# Patient Record
Sex: Female | Born: 1975
Health system: Southern US, Community
[De-identification: ages and names within clinical notes are randomized; demographics above are authoritative.]

## PROBLEM LIST (undated history)

## (undated) DIAGNOSIS — D649 Anemia, unspecified: Secondary | ICD-10-CM

## (undated) DIAGNOSIS — G500 Trigeminal neuralgia: Secondary | ICD-10-CM

## (undated) DIAGNOSIS — C9000 Multiple myeloma not having achieved remission: Secondary | ICD-10-CM

## (undated) DIAGNOSIS — O139 Gestational [pregnancy-induced] hypertension without significant proteinuria, unspecified trimester: Secondary | ICD-10-CM

## (undated) DIAGNOSIS — O24419 Gestational diabetes mellitus in pregnancy, unspecified control: Secondary | ICD-10-CM

## (undated) HISTORY — DX: Gestational (pregnancy-induced) hypertension without significant proteinuria, unspecified trimester: O13.9

## (undated) HISTORY — DX: Trigeminal neuralgia: G50.0

## (undated) HISTORY — DX: Gestational diabetes mellitus in pregnancy, unspecified control: O24.419

## (undated) HISTORY — DX: Anemia, unspecified: D64.9

## (undated) HISTORY — PX: TRIGEMINAL NERVE DECOMPRESSION: SHX2579

## (undated) HISTORY — PX: APPENDECTOMY: SHX54

---

## 2004-04-17 ENCOUNTER — Inpatient Hospital Stay (HOSPITAL_COMMUNITY): Admission: AD | Admit: 2004-04-17 | Discharge: 2004-04-19 | Payer: Self-pay | Admitting: Obstetrics & Gynecology

## 2004-04-18 ENCOUNTER — Encounter (INDEPENDENT_AMBULATORY_CARE_PROVIDER_SITE_OTHER): Payer: Self-pay | Admitting: Specialist

## 2006-05-05 ENCOUNTER — Encounter (INDEPENDENT_AMBULATORY_CARE_PROVIDER_SITE_OTHER): Payer: Self-pay | Admitting: Specialist

## 2006-05-05 ENCOUNTER — Observation Stay (HOSPITAL_COMMUNITY): Admission: EM | Admit: 2006-05-05 | Discharge: 2006-05-06 | Payer: Self-pay | Admitting: Emergency Medicine

## 2006-07-15 HISTORY — PX: APPENDECTOMY: SHX54

## 2009-12-06 ENCOUNTER — Encounter: Admission: RE | Admit: 2009-12-06 | Discharge: 2009-12-06 | Payer: Self-pay | Admitting: Neurology

## 2010-07-15 HISTORY — PX: TRIGEMINAL NERVE DECOMPRESSION: SHX2579

## 2010-08-05 ENCOUNTER — Encounter: Payer: Self-pay | Admitting: Neurology

## 2010-11-30 NOTE — H&P (Signed)
NAMEHIBBA, SCHRAM                ACCOUNT NO.:  [id number]   MEDICAL RECORD NO.:  [id number]          PATIENT TYPE:  INP   LOCATION:  9172                          FACILITY:  [facility]   PHYSICIAN:  Gerri Spore B. Earlene Plater, M.D.  DATE OF BIRTH:  25-Jan-1976   DATE OF ADMISSION:  04/17/2004  DATE OF DISCHARGE:                                HISTORY & PHYSICAL   ADMISSION DIAGNOSES:  1.  A 35 week intrauterine pregnancy with twins.  2.  Spontaneous rupture of membranes.   HISTORY OF PRESENT ILLNESS:  A 35 year old African-American female, gravida  3, para 1, at 35+ weeks, [facility] May 19, 2004, with twin gestation,  presented today with spontaneous leakage of fluids since 0830.  Has had no  contractions.  Fluid has been clear.  Reports good fetal activity.   Prenatal care at [facility] has been uncomplicated except for late  entry to prenatal care at 24 weeks.   PAST MEDICAL HISTORY:  None.   FAMILY HISTORY:  Diabetes.   PAST SURGICAL HISTORY:  None.   MEDICATIONS:  Prenatal vitamins.   ALLERGIES:  None.   PRENATAL LABORATORY DATA:  O positive, rubella immune, hepatitis B, RPR, HIV  all negative.  Glucola normal.  Group B strep negative.   PHYSICAL EXAMINATION:  VITAL SIGNS:  Blood pressure 140/88, weight 216,  fetal heart tones 147 and 153.  GENERAL:  Alert and oriented in no acute distress.  SKIN:  Warm and dry, no lesions.  HEART:  Regular rate and rhythm.  LUNGS:  Clear to auscultation.  ABDOMEN:  Gravid.  Fundus nontender.  STERILE SPECULUM EXAM:  Large pool of clear fluid with vernix.  No meconium.  Cervix is 4 cm dilated, 80% effaced, -1 station.  Twin A is presenting  vertex.   ASSESSMENT:  1.  Twin intrauterine pregnancy at 35 weeks.  2.  Spontaneous rupture of membranes.  3.  Desires a trial of vaginal delivery.   PLAN:  1.  Admission to labor and delivery.  2.  Ultrasound to confirm presentation of B.  3.  Plan for attempts at vaginal delivery.     Wesl   WBD/MEDQ  D:  04/17/2004  T:  04/17/2004  Job:  [id number]

## 2010-11-30 NOTE — Op Note (Signed)
NAME:  Kara Medina, Kara Medina NO.:  [id number]   MEDICAL RECORD NO.:  [id number]          PATIENT TYPE:  INP   LOCATION:  9172                          FACILITY:  [facility]   PHYSICIAN:  Richardean Sale, M.D.   DATE OF BIRTH:  1975/10/24   DATE OF PROCEDURE:  04/17/2004  DATE OF DISCHARGE:                                 OPERATIVE REPORT   INDICATIONS FOR PROCEDURE:  The patient is a 35 year old African-American  female, gravida 3, para 1-0-1-1, with a known intrauterine gestation at 35+  weeks gestation, who presented with spontaneous rupture of membranes on the  morning of April 17, 2004.  The patient underwent ultrasound evaluation.  The twins were found to be vertex/vertex presentation and the patient  desired attempt at vaginal delivery.  She had a prior vaginal delivery of an  8 pound baby without complications with her first pregnancy.   DELIVERY FINDINGS:  Baby A occipitoanterior, Apgars 8 and 9, cord around the  shoulder, arterial pH 7.23.  Baby B left occipitoposterior, Apgars 9 and 9,  cord wrapped around the shoulder, arterial cord pH 7.26.   DESCRIPTION OF PROCEDURE:  The patient was augmented with Pitocin.  Made  adequate progress in labor and was monitored continuously with a fetal scalp  electrode for baby A and external morning for baby B.  Both heart rate  tracings had been reassuring with baselines in the 150's for baby B and  160's for baby A. Spontaneous accelerations were noted.  The patient began  to complain of pressure.  Baby A's heart rate was noted to have a baseline  of 160 to 170 with variable decelerations down to the 80's with  contractions.  Vaginal examination confirmed the patient was 9 cm,  completely dilated, and at 0 station.  Given the variable decelerations and  the twin intrauterine gestation, she was transferred to the operating room  for a double setup.  Baby B's heart rate tracing remained in the 150's with  an occasional mild  variable deceleration.  When the patient entered the  operating room, she was transferred to the delivery table.  Epidural was  already in place.  She was placed in the dorsal lithotomy position.  Foley  catheter was removed.  The heart rate monitor was reconnected, but failed to  record the tracing throughout the delivery process.  Baby A's heart rate  remained in the 160's to 170's with variable decelerations to the 100's.  The patient upon entering into the delivery room after she was prepped and  draped, was found to be complete, complete, and +1 station.  She delivered  the baby with the next contraction.  Her second stage was less than five  minutes.  The infant was occipitoanterior with a cord wrapped around the  shoulder.  The infant was bulb suctioned and the cord was clamped and cut  and the infant was transferred to the warmer with NICU in attendance.  Apgars for baby A were 8 and 9.  The cord for baby A was clamped with a  single hemostat.  Vaginal examination  was performed and baby B was confirmed  to be vertex and engaged in the pelvis.  Ultrasound confirmed the vertex  presentation.  Heart rate was in the 140's.  Amniotomy was performed, the  fluid was clear, and a scalp electrode was placed.  With the first maternal  push, the heart rate went down to the 60's for approximately 30 seconds and  then returned to baseline in the 140's.  Heart rate remained in the 140's  with mild variables to the 120's throughout the second stage which was  approximately five minutes.  The infant was delivered from the left  occipitoposterior.  There was  a cord around the shoulder.  The infant was  delivered with a vigorous cry and bulb suctioned.  The cord was clamped and  cut and handed off to the awaiting NICU attendants.  Apgars were 9 and 9.  The cord for baby B was then doubly clamped with two umbilical cord clamps.  Arterial cord gases were obtained for both.  Baby A's cord gas was  7.23,  baby B 7.26.  The placenta was then delivered spontaneously.  It appeared to  be fused.  Both cords had three vessel cord noted.  The placenta was sent to  pathology for evaluation.  The uterus was then explored and was free of any  placental tissue.  The cervix was visualized and was normal.  The vault was  visualized and was normal without lacerations or lesions.  The perineum was  intact.  There was a right periurethral abrasion which was hemostatic and  not repaired.  Bimanual examination was performed.  The fundus was firm.  Pitocin was  administered immediately following delivery of the placenta.  The infant's  were transferred to the well baby nursery.  Mother was taken out of the  dorsal lithotomy position and was transferred back to the labor room in  excellent condition.  Estimated blood loss was 300 mL.  There were no  complications.      JW/MEDQ  D:  04/17/2004  T:  04/17/2004  Job:  [id number]

## 2010-11-30 NOTE — H&P (Signed)
NAMEGROVER, WOODFIELD NO.:  [id number]   MEDICAL RECORD NO.:  [id number]          PATIENT TYPE:  EMS   LOCATION:  ED                           FACILITY:  [facility]   PHYSICIAN:  Lebron Conners, M.D.   DATE OF BIRTH:  Nov 24, 1975   DATE OF ADMISSION:  05/05/2006  DATE OF DISCHARGE:                                HISTORY & PHYSICAL   CHIEF COMPLAINT:  Abdominal pain.   PRESENT ILLNESS:  The patient is a 35 year old black female with about a 12-  hour history of central and then subsequent right lower quadrant abdominal  pain.  It hurt so bad, she came to the emergency room.  She vomited once.  There was no fever.  The pain persists.  A white count was about 16,000.  Urinalysis unremarkable.  CT scan of the abdomen demonstrated acute  appendicitis without evidence of rupture.   PAST MEDICAL HISTORY:  No serious illnesses.  No operations.  No familial  illnesses.  She is a nonsmoker, nondrinker, and uses no street drugs.   ALLERGIES:  No known allergies.   MEDICATIONS:  None except for pain medicine today.   REVIEW OF SYSTEMS:  Denies heart and lung problems.  Denies any hepatitis or  liver trouble.  No renal problems.  She denied all other symptoms from the  15 system reviews.   PHYSICAL EXAMINATION:  VITAL SIGNS:  Temperature and vital signs were unremarkable as recorded by  nurse.  GENERAL:  The patient was in pain but mental status was normal.  HEAD AND NECK:  Unremarkable with no enlargement of the thyroid.  No neck  mass.  No supraclavicular lymphadenopathy.  No scleral icterus.  Mucous  membranes moist.  CHEST:  Clear to auscultation.  No chest wall tenderness.  BREASTS:  Normal.  HEART: Rate and rhythm normal.  No murmur or gallop.  ABDOMEN: Very tender in the right lower quadrant with some rebound  tenderness.  No mass present.  Bowel sounds decreased.  No organomegaly.  EXTREMITIES: Normal.  No edema.  No skin lesions.  Good pulses.  SKIN: No lesions  are noted.  NEUROLOGIC: Grossly normal.   IMPRESSION:  Acute appendicitis.   PLAN:  Immediate laparoscopic appendectomy.  The patient agrees to have that  done and accepts the slight risks of the injuries to the viscera and the  need for open procedure.  I will proceed tonight.      Lebron Conners, M.D.  Electronically Signed     WB/MEDQ  D:  05/05/2006  T:  05/06/2006  Job:  [id number]

## 2010-11-30 NOTE — Op Note (Signed)
Kara Medina, LAMARCHE NO.:  [id number]   MEDICAL RECORD NO.:  [id number]          PATIENT TYPE:  EMS   LOCATION:  ED                           FACILITY:  [facility]   PHYSICIAN:  Lebron Conners, M.D.   DATE OF BIRTH:  1975-11-22   DATE OF PROCEDURE:  05/05/2006  DATE OF DISCHARGE:                                 OPERATIVE REPORT   PRE AND POSTOPERATIVE DIAGNOSIS:  Acute appendicitis.   OPERATION:  Laparoscopic appendectomy.   SURGEON:  Lebron Conners, M.D.   ANESTHESIA:  General and local.   SPECIMEN:  Appendix.   BLOOD LOSS:  Minimal.   COMPLICATIONS:  None.   PROCEDURE:  After the patient was monitored and asleep and had routine  preparation and draping of the abdomen and had a Foley catheter in place, I  infiltrated local anesthetic in the left lower quadrant and just below the  umbilicus and in the right upper quadrant.  I made a 2 cm vertical incision  just below the umbilicus and a similar incision in the left lower quadrant  and a very small incision and right upper quadrant.  I dissected down  through the fat just below the umbilicus and made a 2 cm incision in the  midline fascia and bluntly entered the peritoneum.  There were no adhesions.  I placed a 0 Vicryl pursestring suture in the fascia and secured a Buice  cannula and inflated the abdomen with carbon dioxide.  I then put in a 5 mm  right upper quadrant port and 11-mm left lower quadrant port under direct  view noting that I did not injure any viscera with insertion of the ports.  I then moved the small intestine away from the cecum and just inferior to  and behind cecum I found the appendix which was enlarged and acutely  inflamed.  I elevated it slightly and took down some adhesions laterally and  had a nice view of the proximal part of the appendix and mesentery.  The  appendix was healthy at its juncture with the cecum.  I stapled across the  appendix and the mesentery with endovascular  stapler and that made a nice  division.  I placed the appendix in a plastic pouch.  There were too small  bleeders on the mesentery which I cauterized and then hemostasis was good.  After assuring good hemostasis and no visceral injuries.  I removed the  appendix through the umbilical incision and tied the pursestring suture.  I  then removed the right upper quadrant port under direct view and saw no  bleeding from the abdominal wall.  After allowing the carbon dioxide to  escape and removed the left lower quadrant port.  I closed all skin  incisions with intracuticular 4-0 Vicryl and Steri-Strips.  The patient  tolerated the operation well.      Lebron Conners, M.D.  Electronically Signed    WB/MEDQ  D:  05/05/2006  T:  05/06/2006  Job:  [id number]

## 2011-04-12 ENCOUNTER — Emergency Department (HOSPITAL_COMMUNITY)
Admission: EM | Admit: 2011-04-12 | Discharge: 2011-04-12 | Disposition: A | Discharge status: Home / Self Care | Payer: 59 | Attending: Emergency Medicine | Admitting: Emergency Medicine

## 2011-04-12 DIAGNOSIS — R000 Tachycardia, unspecified: Secondary | ICD-10-CM | POA: Insufficient documentation

## 2011-04-12 DIAGNOSIS — I10 Essential (primary) hypertension: Secondary | ICD-10-CM | POA: Insufficient documentation

## 2011-04-12 DIAGNOSIS — L500 Allergic urticaria: Secondary | ICD-10-CM | POA: Insufficient documentation

## 2014-06-07 ENCOUNTER — Other Ambulatory Visit: Payer: Self-pay | Admitting: Family Medicine

## 2014-06-07 ENCOUNTER — Other Ambulatory Visit (HOSPITAL_COMMUNITY)
Admission: RE | Admit: 2014-06-07 | Discharge: 2014-06-07 | Disposition: A | Discharge status: Home / Self Care | Payer: 59 | Source: Ambulatory Visit | Attending: Family Medicine | Admitting: Family Medicine

## 2014-06-07 DIAGNOSIS — Z1151 Encounter for screening for human papillomavirus (HPV): Secondary | ICD-10-CM | POA: Insufficient documentation

## 2014-06-07 DIAGNOSIS — Z124 Encounter for screening for malignant neoplasm of cervix: Secondary | ICD-10-CM | POA: Insufficient documentation

## 2014-06-07 DIAGNOSIS — N760 Acute vaginitis: Secondary | ICD-10-CM | POA: Insufficient documentation

## 2014-06-10 LAB — CYTOLOGY - PAP

## 2015-07-16 NOTE — L&D Delivery Note (Signed)
Delivery Note At  a healthy female was delivered via  (Presentation: Occiput left ant  ).  APGAR: 8, 9; weight  pending.   Placenta status: complete .  Cord:  with the following complications: none.  Cord pH: N/A  Anesthesia:  Epidural Episiotomy:  None Lacerations:  Periuretral Suture Repair: 3.0 vicryl Est. Blood Loss (mL):  200  Mom to postpartum.  Baby to Couplet care / Skin to Skin.  Kara Medina,Kara Medina 02/09/2016, 12:49 PM

## 2015-08-29 LAB — OB RESULTS CONSOLE HIV ANTIBODY (ROUTINE TESTING): HIV: NONREACTIVE

## 2015-08-29 LAB — OB RESULTS CONSOLE RUBELLA ANTIBODY, IGM: Rubella: IMMUNE

## 2015-08-29 LAB — OB RESULTS CONSOLE GC/CHLAMYDIA
Chlamydia: NEGATIVE
Gonorrhea: NEGATIVE

## 2015-08-29 LAB — OB RESULTS CONSOLE RPR: RPR: NONREACTIVE

## 2015-08-29 LAB — OB RESULTS CONSOLE ABO/RH: RH Type: POSITIVE

## 2015-08-29 LAB — OB RESULTS CONSOLE HEPATITIS B SURFACE ANTIGEN: Hepatitis B Surface Ag: NEGATIVE

## 2015-08-29 LAB — OB RESULTS CONSOLE ANTIBODY SCREEN: Antibody Screen: NEGATIVE

## 2015-12-18 ENCOUNTER — Encounter: Payer: 59 | Attending: Obstetrics & Gynecology | Admitting: Skilled Nursing Facility1

## 2015-12-18 VITALS — Ht 67.0 in | Wt 264.0 lb

## 2015-12-18 DIAGNOSIS — O24410 Gestational diabetes mellitus in pregnancy, diet controlled: Secondary | ICD-10-CM

## 2015-12-18 DIAGNOSIS — O24419 Gestational diabetes mellitus in pregnancy, unspecified control: Secondary | ICD-10-CM | POA: Insufficient documentation

## 2015-12-19 ENCOUNTER — Encounter: Payer: Self-pay | Admitting: Skilled Nursing Facility1

## 2015-12-19 NOTE — Progress Notes (Signed)
  Patient was seen on 12/18/2015 for Gestational Diabetes self-management class at the [facility]. The following learning objectives were met by the patient during this course:   States the definition of Gestational Diabetes  States why dietary management is important in controlling blood glucose  Describes the effects each nutrient has on blood glucose levels  Demonstrates ability to create a balanced meal plan  Demonstrates carbohydrate counting   States when to check blood glucose levels involving a total of 4 separate occurences in a day  Demonstrates proper blood glucose monitoring techniques  States the effect of stress and exercise on blood glucose levels  States the importance of limiting caffeine and abstaining from alcohol and smoking  Demonstrates the knowledge the glucometer provided in class may not be covered by their insurance and to call their insurance provider immediately after class to know which glucometer their insurance provider does cover as well as calling their physician the next day for a prescription to the glucometer their insurance does cover (if the one provided is not) as well as the lancets and strips for that meter.  Blood glucose monitor given: Contour next one  Lot # [id number] Exp: 09/11/2016 Blood glucose reading: Not taken due to unavailable strips  Patient instructed to monitor glucose levels: FBS: 60 - <90 1 hour: <140 2 hour: <120  *Patient received handouts:  Nutrition Diabetes and Pregnancy  Carbohydrate Counting List  Patient will be seen for follow-up as needed.

## 2016-01-10 LAB — OB RESULTS CONSOLE GBS: GBS: POSITIVE

## 2016-01-31 ENCOUNTER — Other Ambulatory Visit: Payer: Self-pay | Admitting: Obstetrics & Gynecology

## 2016-02-01 ENCOUNTER — Telehealth (HOSPITAL_COMMUNITY): Payer: Self-pay | Admitting: *Deleted

## 2016-02-01 ENCOUNTER — Encounter (HOSPITAL_COMMUNITY): Payer: Self-pay | Admitting: *Deleted

## 2016-02-01 NOTE — Telephone Encounter (Signed)
Preadmission screen  

## 2016-02-08 ENCOUNTER — Encounter (HOSPITAL_COMMUNITY): Payer: Self-pay | Admitting: Certified Registered Nurse Anesthetist

## 2016-02-08 ENCOUNTER — Encounter (HOSPITAL_COMMUNITY): Payer: Self-pay

## 2016-02-08 ENCOUNTER — Inpatient Hospital Stay (HOSPITAL_COMMUNITY): Payer: 59 | Admitting: Anesthesiology

## 2016-02-08 ENCOUNTER — Inpatient Hospital Stay (HOSPITAL_COMMUNITY)
Admission: RE | Admit: 2016-02-08 | Discharge: 2016-02-09 | DRG: 775 | Disposition: A | Discharge status: Home / Self Care | Payer: 59 | Source: Ambulatory Visit | Attending: Obstetrics & Gynecology | Admitting: Obstetrics & Gynecology

## 2016-02-08 DIAGNOSIS — O3663X0 Maternal care for excessive fetal growth, third trimester, not applicable or unspecified: Secondary | ICD-10-CM | POA: Diagnosis present

## 2016-02-08 DIAGNOSIS — O99214 Obesity complicating childbirth: Secondary | ICD-10-CM | POA: Diagnosis present

## 2016-02-08 DIAGNOSIS — O134 Gestational [pregnancy-induced] hypertension without significant proteinuria, complicating childbirth: Principal | ICD-10-CM | POA: Diagnosis present

## 2016-02-08 DIAGNOSIS — K219 Gastro-esophageal reflux disease without esophagitis: Secondary | ICD-10-CM | POA: Diagnosis present

## 2016-02-08 DIAGNOSIS — Z833 Family history of diabetes mellitus: Secondary | ICD-10-CM | POA: Diagnosis not present

## 2016-02-08 DIAGNOSIS — O24420 Gestational diabetes mellitus in childbirth, diet controlled: Secondary | ICD-10-CM | POA: Diagnosis present

## 2016-02-08 DIAGNOSIS — Z6841 Body Mass Index (BMI) 40.0 and over, adult: Secondary | ICD-10-CM | POA: Diagnosis not present

## 2016-02-08 DIAGNOSIS — IMO0002 Reserved for concepts with insufficient information to code with codable children: Secondary | ICD-10-CM | POA: Diagnosis present

## 2016-02-08 DIAGNOSIS — Z3A39 39 weeks gestation of pregnancy: Secondary | ICD-10-CM

## 2016-02-08 DIAGNOSIS — O99824 Streptococcus B carrier state complicating childbirth: Secondary | ICD-10-CM | POA: Diagnosis present

## 2016-02-08 DIAGNOSIS — O9962 Diseases of the digestive system complicating childbirth: Secondary | ICD-10-CM | POA: Diagnosis present

## 2016-02-08 DIAGNOSIS — Z8249 Family history of ischemic heart disease and other diseases of the circulatory system: Secondary | ICD-10-CM

## 2016-02-08 LAB — CBC
HCT: 31.9 % — ABNORMAL LOW (ref 36.0–46.0)
HCT: 32.8 % — ABNORMAL LOW (ref 36.0–46.0)
HCT: 31.3 % — ABNORMAL LOW (ref 36.0–46.0)
Hemoglobin: 10.4 g/dL — ABNORMAL LOW (ref 12.0–15.0)
Hemoglobin: 10.9 g/dL — ABNORMAL LOW (ref 12.0–15.0)
Hemoglobin: 10.3 g/dL — ABNORMAL LOW (ref 12.0–15.0)
MCH: 30.3 pg (ref 26.0–34.0)
MCH: 30.9 pg (ref 26.0–34.0)
MCH: 30.6 pg (ref 26.0–34.0)
MCHC: 32.6 g/dL (ref 30.0–36.0)
MCHC: 33.2 g/dL (ref 30.0–36.0)
MCHC: 32.9 g/dL (ref 30.0–36.0)
MCV: 93 fL (ref 78.0–100.0)
MCV: 92.9 fL (ref 78.0–100.0)
MCV: 92.9 fL (ref 78.0–100.0)
Platelets: 189 10*3/uL (ref 150–400)
Platelets: ADEQUATE 10*3/uL (ref 150–400)
Platelets: 182 10*3/uL (ref 150–400)
RBC: 3.43 MIL/uL — ABNORMAL LOW (ref 3.87–5.11)
RBC: 3.53 MIL/uL — ABNORMAL LOW (ref 3.87–5.11)
RBC: 3.37 MIL/uL — ABNORMAL LOW (ref 3.87–5.11)
RDW: 14.1 % (ref 11.5–15.5)
RDW: 14.1 % (ref 11.5–15.5)
RDW: 13.9 % (ref 11.5–15.5)
WBC: 10.4 10*3/uL (ref 4.0–10.5)
WBC: 9.9 10*3/uL (ref 4.0–10.5)
WBC: 13.1 10*3/uL — ABNORMAL HIGH (ref 4.0–10.5)

## 2016-02-08 LAB — PLATELET COUNT: Platelets: 197 10*3/uL (ref 150–400)

## 2016-02-08 LAB — TYPE AND SCREEN
ABO/RH(D): O POS
Antibody Screen: NEGATIVE

## 2016-02-08 LAB — ABO/RH: ABO/RH(D): O POS

## 2016-02-08 LAB — RPR: RPR Ser Ql: NONREACTIVE

## 2016-02-08 MED ORDER — EPHEDRINE 5 MG/ML INJ
10.0000 mg | INTRAVENOUS | Status: DC | PRN
  Filled 2016-02-08: qty 4

## 2016-02-08 MED ORDER — EPHEDRINE 5 MG/ML INJ: 10.0000 mg | INTRAVENOUS | Status: DC | PRN

## 2016-02-08 MED ORDER — LACTATED RINGERS IV SOLN
INTRAVENOUS | Status: DC
  Administered 2016-02-08 (×2): via INTRAVENOUS

## 2016-02-08 MED ORDER — DIBUCAINE 1 % RE OINT: 1.0000 "application " | TOPICAL_OINTMENT | RECTAL | Status: DC | PRN

## 2016-02-08 MED ORDER — TERBUTALINE SULFATE 1 MG/ML IJ SOLN
0.2500 mg | Freq: Once | INTRAMUSCULAR | Status: DC | PRN
  Filled 2016-02-08: qty 1

## 2016-02-08 MED ORDER — LIDOCAINE HCL (PF) 1 % IJ SOLN
30.0000 mL | INTRAMUSCULAR | Status: DC | PRN
Start: 2016-02-08 — End: 2016-02-10
  Administered 2016-02-08: 30 mL via SUBCUTANEOUS
  Filled 2016-02-08: qty 30

## 2016-02-08 MED ORDER — ZOLPIDEM TARTRATE 5 MG PO TABS: 5.0000 mg | ORAL_TABLET | Freq: Every evening | ORAL | Status: DC | PRN

## 2016-02-08 MED ORDER — PHENYLEPHRINE 40 MCG/ML (10ML) SYRINGE FOR IV PUSH (FOR BLOOD PRESSURE SUPPORT): 80.0000 ug | PREFILLED_SYRINGE | INTRAVENOUS | Status: DC | PRN

## 2016-02-08 MED ORDER — DEXTROSE 5 % IV SOLN
5.0000 10*6.[IU] | Freq: Once | INTRAVENOUS | Status: DC
  Filled 2016-02-08: qty 5

## 2016-02-08 MED ORDER — IBUPROFEN 600 MG PO TABS
600.0000 mg | ORAL_TABLET | Freq: Four times a day (QID) | ORAL | Status: DC
  Administered 2016-02-09 (×4): 600 mg via ORAL
  Filled 2016-02-08 (×4): qty 1

## 2016-02-08 MED ORDER — BENZOCAINE-MENTHOL 20-0.5 % EX AERO: 1.0000 "application " | INHALATION_SPRAY | CUTANEOUS | Status: DC | PRN

## 2016-02-08 MED ORDER — OXYTOCIN 40 UNITS IN LACTATED RINGERS INFUSION - SIMPLE MED
1.0000 m[IU]/min | INTRAVENOUS | Status: DC
  Administered 2016-02-08: 2 m[IU]/min via INTRAVENOUS
  Filled 2016-02-08: qty 1000

## 2016-02-08 MED ORDER — OXYCODONE-ACETAMINOPHEN 5-325 MG PO TABS: 2.0000 | ORAL_TABLET | ORAL | Status: DC | PRN

## 2016-02-08 MED ORDER — ONDANSETRON HCL 4 MG/2ML IJ SOLN: 4.0000 mg | Freq: Four times a day (QID) | INTRAMUSCULAR | Status: DC | PRN

## 2016-02-08 MED ORDER — LACTATED RINGERS IV SOLN: 500.0000 mL | Freq: Once | INTRAVENOUS | Status: DC

## 2016-02-08 MED ORDER — TETANUS-DIPHTH-ACELL PERTUSSIS 5-2.5-18.5 LF-MCG/0.5 IM SUSP: 0.5000 mL | Freq: Once | INTRAMUSCULAR | Status: DC

## 2016-02-08 MED ORDER — COCONUT OIL OIL: 1.0000 "application " | TOPICAL_OIL | Status: DC | PRN

## 2016-02-08 MED ORDER — DIPHENHYDRAMINE HCL 50 MG/ML IJ SOLN: 12.5000 mg | INTRAMUSCULAR | Status: DC | PRN

## 2016-02-08 MED ORDER — FENTANYL 2.5 MCG/ML BUPIVACAINE 1/10 % EPIDURAL INFUSION (WH - ANES)
14.0000 mL/h | INTRAMUSCULAR | Status: DC | PRN
  Administered 2016-02-08: 14 mL/h via EPIDURAL
  Filled 2016-02-08: qty 125

## 2016-02-08 MED ORDER — OXYTOCIN 40 UNITS IN LACTATED RINGERS INFUSION - SIMPLE MED: 2.5000 [IU]/h | INTRAVENOUS | Status: DC

## 2016-02-08 MED ORDER — OXYTOCIN 40 UNITS IN LACTATED RINGERS INFUSION - SIMPLE MED
2.5000 [IU]/h | INTRAVENOUS | Status: DC | PRN
Start: 2016-02-08 — End: 2016-02-10

## 2016-02-08 MED ORDER — PHENYLEPHRINE 40 MCG/ML (10ML) SYRINGE FOR IV PUSH (FOR BLOOD PRESSURE SUPPORT)
80.0000 ug | PREFILLED_SYRINGE | INTRAVENOUS | Status: DC | PRN
  Filled 2016-02-08: qty 5
  Filled 2016-02-08: qty 10

## 2016-02-08 MED ORDER — WITCH HAZEL-GLYCERIN EX PADS: 1.0000 "application " | MEDICATED_PAD | CUTANEOUS | Status: DC | PRN

## 2016-02-08 MED ORDER — ONDANSETRON HCL 4 MG/2ML IJ SOLN: 4.0000 mg | INTRAMUSCULAR | Status: DC | PRN

## 2016-02-08 MED ORDER — SOD CITRATE-CITRIC ACID 500-334 MG/5ML PO SOLN: 30.0000 mL | ORAL | Status: DC | PRN

## 2016-02-08 MED ORDER — ONDANSETRON HCL 4 MG PO TABS: 4.0000 mg | ORAL_TABLET | ORAL | Status: DC | PRN

## 2016-02-08 MED ORDER — ACETAMINOPHEN 325 MG PO TABS: 650.0000 mg | ORAL_TABLET | ORAL | Status: DC | PRN

## 2016-02-08 MED ORDER — OXYCODONE-ACETAMINOPHEN 5-325 MG PO TABS: 1.0000 | ORAL_TABLET | ORAL | Status: DC | PRN

## 2016-02-08 MED ORDER — GENTAMICIN SULFATE 40 MG/ML IJ SOLN
Freq: Three times a day (TID) | INTRAVENOUS | Status: DC
  Administered 2016-02-08: 10:00:00 via INTRAVENOUS
  Filled 2016-02-08 (×2): qty 4.5

## 2016-02-08 MED ORDER — SENNOSIDES-DOCUSATE SODIUM 8.6-50 MG PO TABS
2.0000 | ORAL_TABLET | ORAL | Status: DC
  Administered 2016-02-09: 2 via ORAL
  Filled 2016-02-08: qty 2

## 2016-02-08 MED ORDER — PRENATAL MULTIVITAMIN CH
1.0000 | ORAL_TABLET | Freq: Every day | ORAL | Status: DC
  Administered 2016-02-09: 1 via ORAL
  Filled 2016-02-08: qty 1

## 2016-02-08 MED ORDER — OXYTOCIN BOLUS FROM INFUSION
500.0000 mL | Freq: Once | INTRAVENOUS | Status: AC
  Administered 2016-02-08: 500 mL via INTRAVENOUS

## 2016-02-08 MED ORDER — DIPHENHYDRAMINE HCL 25 MG PO CAPS: 25.0000 mg | ORAL_CAPSULE | Freq: Four times a day (QID) | ORAL | Status: DC | PRN

## 2016-02-08 MED ORDER — PHENYLEPHRINE 40 MCG/ML (10ML) SYRINGE FOR IV PUSH (FOR BLOOD PRESSURE SUPPORT)
80.0000 ug | PREFILLED_SYRINGE | INTRAVENOUS | Status: DC | PRN
  Filled 2016-02-08: qty 5

## 2016-02-08 MED ORDER — LACTATED RINGERS IV SOLN: 500.0000 mL | INTRAVENOUS | Status: DC | PRN

## 2016-02-08 MED ORDER — LIDOCAINE HCL (PF) 1 % IJ SOLN
INTRAMUSCULAR | Status: DC | PRN
  Administered 2016-02-08: 5 mL via EPIDURAL
  Administered 2016-02-08: 4 mL via EPIDURAL

## 2016-02-08 MED ORDER — PENICILLIN G POTASSIUM 5000000 UNITS IJ SOLR
2.5000 10*6.[IU] | INTRAVENOUS | Status: DC
  Filled 2016-02-08: qty 2.5

## 2016-02-08 MED ORDER — SIMETHICONE 80 MG PO CHEW: 80.0000 mg | CHEWABLE_TABLET | ORAL | Status: DC | PRN

## 2016-02-08 NOTE — H&P (Signed)
Kara Medina is a 40 y.o. female G47P1003 [id number] for Induction.  HPP/HPI:  Mild PIH with normal PIH labs/24 hr urine this week.  [place] normal EFW.  AFI wnl.  Cephalic. GDMA1 well controled.  Good FMs.  No reg UC.  No vaginal bleeding.  No AF leak.  No PEC Sx.  OB History    Gravida Para Term Preterm AB Living   3 2 1    0 3   SAB TAB Ectopic Multiple Live Births   0     1 3     Past Medical History:  Diagnosis Date  . Anemia   . Gestational diabetes    diet controlled  . Pregnancy induced hypertension   . Trigeminal neuralgia    2012 corrected with surgery   Past Surgical History:  Procedure Laterality Date  . APPENDECTOMY    . TRIGEMINAL NERVE DECOMPRESSION     Family History: family history includes Cancer in her other and paternal grandfather; Diabetes in her father; Hypertension in her maternal grandfather, maternal grandmother, mother, and other. Social History:  reports that she has never smoked. She has never used smokeless tobacco. She reports that she does not drink alcohol or use drugs.  Current Facility-Administered Medications:  .  oxytocin (PITOCIN) IV infusion 40 units in LR 1000 mL - Premix, 1-40 milli-units/min, Intravenous, Titrated, MPrincess Bruins MD .  penicillin G potassium 2.5 Million Units in dextrose 5 % 100 mL IVPB, 2.5 Million Units, Intravenous, Q4H, MPrincess Bruins MD .  penicillin G potassium 5 Million Units in dextrose 5 % 250 mL IVPB, 5 Million Units, Intravenous, Once, MPrincess Bruins MD .  terbutaline (BRETHINE) injection 0.25 mg, 0.25 mg, Subcutaneous, Once PRN, MPrincess Bruins MD   Allergies  Allergen Reactions  . Amoxicillin Hives      There were no vitals taken for this visit. Exam Physical Exam  VE 2/80%/Vtx/-3.  AROM  AF clear. FHR 140's, good variability, accelerations present, no deceleration UCs 2-4 min   Prenatal labs: ABO, Rh: O/Positive/-- (02/14 0000) Antibody: Negative (02/14 0000) Rubella:  Immune RPR: Nonreactive (02/14 0000)  HBsAg: Negative (02/14 0000)  HIV: Non-reactive (02/14 0000)  Genetic testing: Informaseq wnl [place]: wnl 1 hr GTT: Abnormal.  3 hr GTT Abnormal. GBS: Positive (06/28 0000)   Assessment/Plan: G3P1 40+ wks Mild PIH/GDMA1/GBS pos for Induction.  Pitocin.  AROM.  FHR Cat 1.     Addie Alonge,MARIE-LYNE 02/08/2016, 7:26 AM

## 2016-02-08 NOTE — Anesthesia Preprocedure Evaluation (Signed)
Anesthesia Evaluation  Patient identified by MRN, date of birth, ID band Patient awake    Reviewed: Allergy & Precautions, NPO status , Patient's Chart, lab work & pertinent test results  Airway Mallampati: III       Dental no notable dental hx. (+) Teeth Intact   Pulmonary neg pulmonary ROS,    Pulmonary exam normal breath sounds clear to auscultation       Cardiovascular hypertension, Normal cardiovascular exam Rhythm:Regular Rate:Normal  Hx/o PIH   Neuro/Psych Hx/o Trigeminal neuralgia- S/P ablation  Neuromuscular disease negative neurological ROS  negative psych ROS   GI/Hepatic Neg liver ROS, GERD  ,  Endo/Other  diabetes, Well Controlled, GestationalMorbid obesity  Renal/GU negative Renal ROS  negative genitourinary   Musculoskeletal negative musculoskeletal ROS (+)   Abdominal (+) + obese,   Peds  Hematology  (+) anemia ,   Anesthesia Other Findings   Reproductive/Obstetrics                             Anesthesia Physical Anesthesia Plan  ASA: III  Anesthesia Plan: Epidural   Post-op Pain Management:    Induction:   Airway Management Planned: Natural Airway  Additional Equipment:   Intra-op Plan:   Post-operative Plan:   Informed Consent: I have reviewed the patients History and Physical, chart, labs and discussed the procedure including the risks, benefits and alternatives for the proposed anesthesia with the patient or authorized representative who has indicated his/her understanding and acceptance.   Dental advisory given  Plan Discussed with: Anesthesiologist  Anesthesia Plan Comments:         Anesthesia Quick Evaluation

## 2016-02-08 NOTE — Anesthesia Pain Management Evaluation Note (Signed)
  CRNA Pain Management Visit Note  Patient: Kara Medina, 40 y.o., female  "Hello I am a member of the anesthesia team at [facility]. We have an anesthesia team available at all times to provide care throughout the hospital, including epidural management and anesthesia for C-section. I don't know your plan for the delivery whether it a natural birth, water birth, IV sedation, nitrous supplementation, doula or epidural, but we want to meet your pain goals."   1.Was your pain managed to your expectations on prior hospitalizations?     2.What is your expectation for pain management during this hospitalization?      3.How can we help you reach that goal?   Record the patient's initial score and the patient's pain goal.   Pain: 0  Pain Goal: 3 [facility] wants you to be able to say your pain was always managed very well.  Jabier Mutton 02/08/2016

## 2016-02-08 NOTE — Anesthesia Procedure Notes (Signed)
Epidural Patient location during procedure: OB Start time: 02/08/2016 6:18 PM  Staffing Anesthesiologist: Josephine Igo Performed: anesthesiologist   Preanesthetic Checklist Completed: patient identified, site marked, surgical consent, pre-op evaluation, timeout performed, IV checked, risks and benefits discussed and monitors and equipment checked  Epidural Patient position: sitting Prep: site prepped and draped and DuraPrep Patient monitoring: continuous pulse ox and blood pressure Approach: midline Location: L3-L4 Injection technique: LOR air  Needle:  Needle type: Tuohy  Needle gauge: 17 G Needle length: 9 cm and 9 Needle insertion depth: 7 cm Catheter type: closed end flexible Catheter size: 19 Gauge Catheter at skin depth: 13 cm Test dose: negative and Other  Assessment Events: blood not aspirated, injection not painful, no injection resistance, negative IV test and no paresthesia  Additional Notes Patient identified. Risks and benefits discussed including failed block, incomplete  Pain control, post dural puncture headache, nerve damage, paralysis, blood pressure Changes, nausea, vomiting, reactions to medications-both toxic and allergic and post Partum back pain. All questions were answered. Patient expressed understanding and wished to proceed. Sterile technique was used throughout procedure. Epidural site was Dressed with sterile barrier dressing. No paresthesias, signs of intravascular injection Or signs of intrathecal spread were encountered.  Patient was more comfortable after the epidural was dosed. Please see RN's note for documentation of vital signs and FHR which are stable.

## 2016-02-08 NOTE — Progress Notes (Signed)
   TC from nurse to come confirm presentation for IOL  Informal ultrasound: vtx presentation / ROA / subjective normal AFI / + cardiac activity with location at maternal umbilicus  Artelia Laroche CNM [facility]

## 2016-02-09 ENCOUNTER — Encounter (HOSPITAL_COMMUNITY): Payer: Self-pay

## 2016-02-09 LAB — CBC
HCT: 30.2 % — ABNORMAL LOW (ref 36.0–46.0)
Hemoglobin: 10 g/dL — ABNORMAL LOW (ref 12.0–15.0)
MCH: 30.8 pg (ref 26.0–34.0)
MCHC: 33.1 g/dL (ref 30.0–36.0)
MCV: 92.9 fL (ref 78.0–100.0)
Platelets: 181 10*3/uL (ref 150–400)
RBC: 3.25 MIL/uL — ABNORMAL LOW (ref 3.87–5.11)
RDW: 13.8 % (ref 11.5–15.5)
WBC: 14.2 10*3/uL — ABNORMAL HIGH (ref 4.0–10.5)

## 2016-02-09 LAB — COMPREHENSIVE METABOLIC PANEL
ALT: 17 U/L (ref 14–54)
AST: 25 U/L (ref 15–41)
Albumin: 3 g/dL — ABNORMAL LOW (ref 3.5–5.0)
Alkaline Phosphatase: 83 U/L (ref 38–126)
Anion gap: 7 (ref 5–15)
BUN: 6 mg/dL (ref 6–20)
CO2: 21 mmol/L — ABNORMAL LOW (ref 22–32)
Calcium: 8.6 mg/dL — ABNORMAL LOW (ref 8.9–10.3)
Chloride: 105 mmol/L (ref 101–111)
Creatinine, Ser: 0.64 mg/dL (ref 0.44–1.00)
GFR calc Af Amer: >60 mL/min (ref >60)
GFR calc non Af Amer: >60 mL/min (ref >60)
Glucose, Bld: 91 mg/dL (ref 65–99)
Potassium: 4 mmol/L (ref 3.5–5.1)
Sodium: 133 mmol/L — ABNORMAL LOW (ref 135–145)
Total Bilirubin: 0.4 mg/dL (ref 0.3–1.2)
Total Protein: 6.1 g/dL — ABNORMAL LOW (ref 6.5–8.1)

## 2016-02-09 LAB — URIC ACID: Uric Acid, Serum: 4.4 mg/dL (ref 2.3–6.6)

## 2016-02-09 LAB — LACTATE DEHYDROGENASE: LDH: 140 U/L (ref 98–192)

## 2016-02-09 MED ORDER — IBUPROFEN 600 MG PO TABS: 600.0000 mg | ORAL_TABLET | Freq: Four times a day (QID) | ORAL | 0 refills | Status: DC

## 2016-02-09 NOTE — Anesthesia Postprocedure Evaluation (Signed)
Anesthesia Post Note  Patient: Kara Medina  Procedure(s) Performed: * No procedures listed *  Patient location during evaluation: Mother Baby Anesthesia Type: Epidural Level of consciousness: awake and alert Pain management: pain level controlled Vital Signs Assessment: post-procedure vital signs reviewed and stable Respiratory status: spontaneous breathing, nonlabored ventilation and respiratory function stable Cardiovascular status: stable Postop Assessment: no headache, no backache and epidural receding Anesthetic complications: no     Last Vitals:  Vitals:   02/09/16 0005 02/09/16 0525  BP: 140/85 124/75  Pulse: (!) 106 91  Resp: 18 18  Temp: 36.8 C 36.8 C    Last Pain:  Vitals:   02/09/16 0525  TempSrc: Oral  PainSc: 0-No pain   Pain Goal:                 Riki Sheer

## 2016-02-09 NOTE — Discharge Instructions (Signed)
Breast Pumping Tips If you are breastfeeding, there may be times when you cannot feed your baby directly. Returning to work or going on a trip are common examples. Pumping allows you to store breast milk and feed it to your baby later.  You may not get much milk when you first start to pump. Your breasts should start to make more after a few days. If you pump at the times you usually feed your baby, you may be able to keep making enough milk to feed your baby without also using formula. The more often you pump, the more milk you will produce. WHEN SHOULD I PUMP?   You can begin to pump soon after delivery. However, some experts recommend waiting about 4 weeks before giving your infant a bottle to make sure breastfeeding is going well.  If you plan to return to work, begin pumping a few weeks before. This will help you develop techniques that work best for you. It also lets you build up a supply of breast milk.   When you are with your infant, feed on demand and pump after each feeding.   When you are away from your infant for several hours, pump for about 15 minutes every 2-3 hours. Pump both breasts at the same time if you can.   If your infant has a formula feeding, make sure to pump around the same time.   If you drink any alcohol, wait 2 hours before pumping.  HOW DO I PREPARE TO PUMP? Your let-down reflexis the natural reaction to stimulation that makes your breast milk flow. It is easier to stimulate this reflex when you are relaxed. Find relaxation techniques that work for you. If you have difficulty with your let-down reflex, try these methods:   Smell one of your infant's blankets or an item of clothing.   Look at a picture or video of your infant.   Sit in a quiet, private space.   Massage the breast you plan to pump.   Place soothing warmth on the breast.   Play relaxing music.  WHAT ARE SOME GENERAL BREAST PUMPING TIPS?  Wash your hands before you pump. You  do not need to wash your nipples or breasts.  There are three ways to pump.  You can use your hand to massage and compress your breast.  You can use a handheld manual pump.  You can use an electric pump.   Make sure the suction cup (flange) on the breast pump is the right size. Place the flange directly over the nipple. If it is the wrong size or placed the wrong way, it may be painful and cause nipple damage.   If pumping is uncomfortable, apply a small amount of purified or modified lanolin to your nipple and areola.  If you are using an electric pump, adjust the speed and suction power to be more comfortable.  If pumping is painful or if you are not getting very much milk, you may need a different type of pump. A lactation consultant can help you determine what type of pump to use.   Keep a full water bottle near you at all times. Drinking lots of fluid helps you make more milk.  You can store your milk to use later. Pumped breast milk can be stored in a sealable, sterile container or plastic bag. Label all stored breast milk with the date you pumped it.  Milk can stay out at room temperature for up to 8 hours.  You can store your milk in the refrigerator for up to 8 days.  You can store your milk in the freezer for 3 months. Thaw frozen milk using warm water. Do not put it in the microwave.  Do not smoke. Smoking can lower your milk supply and harm your infant. If you need help quitting, ask your health care provider to recommend a program.  [place] A LACTATION CONSULTANT?  You are having trouble pumping.  You are concerned that you are not making enough milk.  You have nipple pain, soreness, or redness.  You want to use birth control. Birth control pills may lower your milk supply. Talk to your health care provider about your options.   This information is not intended to replace advice given to you by your health care provider.  Make sure you discuss any questions you have with your health care provider.   Document Released: 12/19/2009 Document Revised: 07/06/2013 Document Reviewed: 04/23/2013 Elsevier Interactive Patient Education 2016 [employer]. Postpartum Depression and Baby Blues The postpartum period begins right after the birth of a baby. During this time, there is often a great amount of joy and excitement. It is also a time of many changes in the life of the parents. Regardless of how many times a mother gives birth, each child brings new challenges and dynamics to the family. It is not unusual to have feelings of excitement along with confusing shifts in moods, emotions, and thoughts. All mothers are at risk of developing postpartum depression or the "baby blues." These mood changes can occur right after giving birth, or they may occur many months after giving birth. The baby blues or postpartum depression can be mild or severe. Additionally, postpartum depression can go away rather quickly, or it can be a long-term condition.  CAUSES Raised hormone levels and the rapid drop in those levels are thought to be a main cause of postpartum depression and the baby blues. A number of hormones change during and after pregnancy. Estrogen and progesterone usually decrease right after the delivery of your baby. The levels of thyroid hormone and various cortisol steroids also rapidly drop. Other factors that play a role in these mood changes include major life events and genetics.  RISK FACTORS If you have any of the following risks for the baby blues or postpartum depression, know what symptoms to watch out for during the postpartum period. Risk factors that may increase the likelihood of getting the baby blues or postpartum depression include:  Having a personal or family history of depression.   Having depression while being pregnant.   Having premenstrual mood issues or mood issues related to oral  contraceptives.  Having a lot of life stress.   Having marital conflict.   Lacking a social support network.   Having a baby with special needs.   Having health problems, such as diabetes.  SIGNS AND SYMPTOMS Symptoms of baby blues include:  Brief changes in mood, such as going from extreme happiness to sadness.  Decreased concentration.   Difficulty sleeping.   Crying spells, tearfulness.   Irritability.   Anxiety.  Symptoms of postpartum depression typically begin within the first month after giving birth. These symptoms include:  Difficulty sleeping or excessive sleepiness.   Marked weight loss.   Agitation.   Feelings of worthlessness.   Lack of interest in activity or food.  Postpartum psychosis is a very serious condition and can be dangerous. Fortunately, it is  rare. Displaying any of the following symptoms is cause for immediate medical attention. Symptoms of postpartum psychosis include:   Hallucinations and delusions.   Bizarre or disorganized behavior.   Confusion or disorientation.  DIAGNOSIS  A diagnosis is made by an evaluation of your symptoms. There are no medical or lab tests that lead to a diagnosis, but there are various questionnaires that a health care provider may use to identify those with the baby blues, postpartum depression, or psychosis. Often, a screening tool called the [place] Postnatal Depression Scale is used to diagnose depression in the postpartum period.  TREATMENT The baby blues usually goes away on its own in 1-2 weeks. Social support is often all that is needed. You will be encouraged to get adequate sleep and rest. Occasionally, you may be given medicines to help you sleep.  Postpartum depression requires treatment because it can last several months or longer if it is not treated. Treatment may include individual or group therapy, medicine, or both to address any social, physiological, and psychological factors  that may play a role in the depression. Regular exercise, a healthy diet, rest, and social support may also be strongly recommended.  Postpartum psychosis is more serious and needs treatment right away. Hospitalization is often needed. HOME CARE INSTRUCTIONS  Get as much rest as you can. Nap when the baby sleeps.   Exercise regularly. Some women find yoga and walking to be beneficial.   Eat a balanced and nourishing diet.   Do little things that you enjoy. Have a cup of tea, take a bubble bath, read your favorite magazine, or listen to your favorite music.  Avoid alcohol.   Ask for help with household chores, cooking, grocery shopping, or running errands as needed. Do not try to do everything.   Talk to people close to you about how you are feeling. Get support from your partner, family members, friends, or other new moms.  Try to stay positive in how you think. Think about the things you are grateful for.   Do not spend a lot of time alone.   Only take over-the-counter or prescription medicine as directed by your health care provider.  Keep all your postpartum appointments.   Let your health care provider know if you have any concerns.  SEEK MEDICAL CARE IF: You are having a reaction to or problems with your medicine. SEEK IMMEDIATE MEDICAL CARE IF:  You have suicidal feelings.   You think you may harm the baby or someone else. MAKE SURE YOU:  Understand these instructions.  Will watch your condition.  Will get help right away if you are not doing well or get worse.   This information is not intended to replace advice given to you by your health care provider. Make sure you discuss any questions you have with your health care provider.   Document Released: 04/04/2004 Document Revised: 07/06/2013 Document Reviewed: 04/12/2013 Elsevier Interactive Patient Education 2016 [employer]. Postpartum Care After Vaginal Delivery After you deliver your newborn  (postpartum period), the usual stay in the hospital is 24-72 hours. If there were problems with your labor or delivery, or if you have other medical problems, you might be in the hospital longer.  While you are in the hospital, you will receive help and instructions on how to care for yourself and your newborn during the postpartum period.  While you are in the hospital:  Be sure to tell your nurses if you have pain or discomfort, as well as  where you feel the pain and what makes the pain worse.  If you had an incision made near your vagina (episiotomy) or if you had some tearing during delivery, the nurses may put ice packs on your episiotomy or tear. The ice packs may help to reduce the pain and swelling.  If you are breastfeeding, you may feel uncomfortable contractions of your uterus for a couple of weeks. This is normal. The contractions help your uterus get back to normal size.  It is normal to have some bleeding after delivery.  For the first 1-3 days after delivery, the flow is red and the amount may be similar to a period.  It is common for the flow to start and stop.  In the first few days, you may pass some small clots. Let your nurses know if you begin to pass large clots or your flow increases.  Do not  flush blood clots down the toilet before having the nurse look at them.  During the next 3-10 days after delivery, your flow should become more watery and pink or brown-tinged in color.  Ten to fourteen days after delivery, your flow should be a small amount of yellowish-white discharge.  The amount of your flow will decrease over the first few weeks after delivery. Your flow may stop in 6-8 weeks. Most women have had their flow stop by 12 weeks after delivery.  You should change your sanitary pads frequently.  Wash your hands thoroughly with soap and water for at least 20 seconds after changing pads, using the toilet, or before holding or feeding your newborn.  You should  feel like you need to empty your bladder within the first 6-8 hours after delivery.  In case you become weak, lightheaded, or faint, call your nurse before you get out of bed for the first time and before you take a shower for the first time.  Within the first few days after delivery, your breasts may begin to feel tender and full. This is called engorgement. Breast tenderness usually goes away within 48-72 hours after engorgement occurs. You may also notice milk leaking from your breasts. If you are not breastfeeding, do not stimulate your breasts. Breast stimulation can make your breasts produce more milk.  Spending as much time as possible with your newborn is very important. During this time, you and your newborn can feel close and get to know each other. Having your newborn stay in your room (rooming in) will help to strengthen the bond with your newborn. It will give you time to get to know your newborn and become comfortable caring for your newborn.  Your hormones change after delivery. Sometimes the hormone changes can temporarily cause you to feel sad or tearful. These feelings should not last more than a few days. If these feelings last longer than that, you should talk to your caregiver.  If desired, talk to your caregiver about methods of family planning or contraception.  Talk to your caregiver about immunizations. Your caregiver may want you to have the following immunizations before leaving the hospital:  Tetanus, diphtheria, and pertussis (Tdap) or tetanus and diphtheria (Td) immunization. It is very important that you and your family (including grandparents) or others caring for your newborn are up-to-date with the Tdap or Td immunizations. The Tdap or Td immunization can help protect your newborn from getting ill.  Rubella immunization.  Varicella (chickenpox) immunization.  Influenza immunization. You should receive this annual immunization if you did not receive the  immunization during your pregnancy.   This information is not intended to replace advice given to you by your health care provider. Make sure you discuss any questions you have with your health care provider.   Document Released: 04/28/2007 Document Revised: 03/25/2012 Document Reviewed: 02/26/2012 Elsevier Interactive Patient Education 2016 Else[employer] Inc. Breastfeeding and Mastitis Mastitis is inflammation of the breast tissue. It can occur in women who are breastfeeding. This can make breastfeeding painful. Mastitis will sometimes go away on its own. Your health care provider will help determine if treatment is needed. CAUSES Mastitis is often associated with a blocked milk (lactiferous) duct. This can happen when too much milk builds up in the breast. Causes of excess milk in the breast can include:  Poor latch-on. If your baby is not latched onto the breast properly, she or he may not empty your breast completely while breastfeeding.  Allowing too much time to pass between feedings.  Wearing a bra or other clothing that is too tight. This puts extra pressure on the lactiferous ducts so milk does not flow through them as it should. Mastitis can also be caused by a bacterial infection. Bacteria may enter the breast tissue through cuts or openings in the skin. In women who are breastfeeding, this may occur because of cracked or irritated skin. Cracks in the skin are often caused when your baby does not latch on properly to the breast. SIGNS AND SYMPTOMS  Swelling, redness, tenderness, and pain in an area of the breast.  Swelling of the glands under the arm on the same side.  Fever may or may not accompany mastitis. If an infection is allowed to progress, a collection of pus (abscess) may develop. DIAGNOSIS  Your health care provider can usually diagnose mastitis based on your symptoms and a physical exam. Tests may be done to help confirm the diagnosis. These may include:  Removal of pus  from the breast by applying pressure to the area. This pus can be examined in the lab to determine which bacteria are present. If an abscess has developed, the fluid in the abscess can be removed with a needle. This can also be used to confirm the diagnosis and determine the bacteria present. In most cases, pus will not be present.  Blood tests to determine if your body is fighting a bacterial infection.  Mammogram or ultrasound tests to rule out other problems or diseases. TREATMENT  Mastitis that occurs with breastfeeding will sometimes go away on its own. Your health care provider may choose to wait 24 hours after first seeing you to decide whether a prescription medicine is needed. If your symptoms are worse after 24 hours, your health care provider will likely prescribe an antibiotic medicine to treat the mastitis. He or she will determine which bacteria are most likely causing the infection and will then select an appropriate antibiotic medicine. This is sometimes changed based on the results of tests performed to identify the bacteria, or if there is no response to the antibiotic medicine selected. Antibiotic medicines are usually given by mouth. You may also be given medicine for pain. HOME CARE INSTRUCTIONS  Only take over-the-counter or prescription medicines for pain, fever, or discomfort as directed by your health care provider.  If your health care provider prescribed an antibiotic medicine, take the medicine as directed. Make sure you finish it even if you start to feel better.  Do not wear a tight or underwire bra. Wear a soft, supportive bra.  Increase your fluid  intake, especially if you have a fever.  Continue to empty the breast. Your health care provider can tell you whether this milk is safe for your infant or needs to be thrown out. You may be told to stop nursing until your health care provider thinks it is safe for your baby. Use a breast pump if you are advised to stop  nursing.  Keep your nipples clean and dry.  Empty the first breast completely before going to the other breast. If your baby is not emptying your breasts completely for some reason, use a breast pump to empty your breasts.  If you go back to work, pump your breasts while at work to stay in time with your nursing schedule.  Avoid allowing your breasts to become overly filled with milk (engorged). SEEK MEDICAL CARE IF:  You have pus-like discharge from the breast.  Your symptoms do not improve with the treatment prescribed by your health care provider within 2 days. SEEK IMMEDIATE MEDICAL CARE IF:  Your pain and swelling are getting worse.  You have pain that is not controlled with medicine.  You have a red line extending from the breast toward your armpit.  You have a fever or persistent symptoms for more than 2-3 days.  You have a fever and your symptoms suddenly get worse. MAKE SURE YOU:   Understand these instructions.  Will watch your condition.  Will get help right away if you are not doing well or get worse.   This information is not intended to replace advice given to you by your health care provider. Make sure you discuss any questions you have with your health care provider.   Document Released: 10/26/2004 Document Revised: 07/06/2013 Document Reviewed: 02/04/2013 Elsevier Interactive Patient Education [employer].  Breastfeeding Deciding to breastfeed is one of the best choices you can make for you and your baby. A change in hormones during pregnancy causes your breast tissue to grow and increases the number and size of your milk ducts. These hormones also allow proteins, sugars, and fats from your blood supply to make breast milk in your milk-producing glands. Hormones prevent breast milk from being released before your baby is born as well as prompt milk flow after birth. Once breastfeeding has begun, thoughts of your baby, as well as his or her sucking or  crying, can stimulate the release of milk from your milk-producing glands.  BENEFITS OF BREASTFEEDING For Your Baby  Your first milk (colostrum) helps your baby's digestive system function better.  There are antibodies in your milk that help your baby fight off infections.  Your baby has a lower incidence of asthma, allergies, and sudden infant death syndrome.  The nutrients in breast milk are better for your baby than infant formulas and are designed uniquely for your baby's needs.  Breast milk improves your baby's brain development.  Your baby is less likely to develop other conditions, such as childhood obesity, asthma, or type 2 diabetes mellitus. For You  Breastfeeding helps to create a very special bond between you and your baby.  Breastfeeding is convenient. Breast milk is always available at the correct temperature and costs nothing.  Breastfeeding helps to burn calories and helps you lose the weight gained during pregnancy.  Breastfeeding makes your uterus contract to its prepregnancy size faster and slows bleeding (lochia) after you give birth.   Breastfeeding helps to lower your risk of developing type 2 diabetes mellitus, osteoporosis, and breast or ovarian cancer later in life.  SIGNS THAT YOUR BABY IS HUNGRY Early Signs of Hunger  Increased alertness or activity.  Stretching.  Movement of the head from side to side.  Movement of the head and opening of the mouth when the corner of the mouth or cheek is stroked (rooting).  Increased sucking sounds, smacking lips, cooing, sighing, or squeaking.  Hand-to-mouth movements.  Increased sucking of fingers or hands. Late Signs of Hunger  Fussing.  Intermittent crying. Extreme Signs of Hunger Signs of extreme hunger will require calming and consoling before your baby will be able to breastfeed successfully. Do not wait for the following signs of extreme hunger to occur before you initiate  breastfeeding:  Restlessness.  A loud, strong cry.  Screaming. BREASTFEEDING BASICS Breastfeeding Initiation  Find a comfortable place to sit or lie down, with your neck and back well supported.  Place a pillow or rolled up blanket under your baby to bring him or her to the level of your breast (if you are seated). Nursing pillows are specially designed to help support your arms and your baby while you breastfeed.  Make sure that your baby's abdomen is facing your abdomen.  Gently massage your breast. With your fingertips, massage from your chest wall toward your nipple in a circular motion. This encourages milk flow. You may need to continue this action during the feeding if your milk flows slowly.  Support your breast with 4 fingers underneath and your thumb above your nipple. Make sure your fingers are well away from your nipple and your baby's mouth.  Stroke your baby's lips gently with your finger or nipple.  When your baby's mouth is open wide enough, quickly bring your baby to your breast, placing your entire nipple and as much of the colored area around your nipple (areola) as possible into your baby's mouth.  More areola should be visible above your baby's upper lip than below the lower lip.  Your baby's tongue should be between his or her lower gum and your breast.  Ensure that your baby's mouth is correctly positioned around your nipple (latched). Your baby's lips should create a seal on your breast and be turned out (everted).  It is common for your baby to suck about 2-3 minutes in order to start the flow of breast milk. Latching Teaching your baby how to latch on to your breast properly is very important. An improper latch can cause nipple pain and decreased milk supply for you and poor weight gain in your baby. Also, if your baby is not latched onto your nipple properly, he or she may swallow some air during feeding. This can make your baby fussy. Burping your baby when  you switch breasts during the feeding can help to get rid of the air. However, teaching your baby to latch on properly is still the best way to prevent fussiness from swallowing air while breastfeeding. Signs that your baby has successfully latched on to your nipple:  Silent tugging or silent sucking, without causing you pain.  Swallowing heard between every 3-4 sucks.  Muscle movement above and in front of his or her ears while sucking. Signs that your baby has not successfully latched on to nipple:  Sucking sounds or smacking sounds from your baby while breastfeeding.  Nipple pain. If you think your baby has not latched on correctly, slip your finger into the corner of your baby's mouth to break the suction and place it between your baby's gums. Attempt breastfeeding initiation again. Signs of Successful Breastfeeding  Signs from your baby:  A gradual decrease in the number of sucks or complete cessation of sucking.  Falling asleep.  Relaxation of his or her body.  Retention of a small amount of milk in his or her mouth.  Letting go of your breast by himself or herself. Signs from you:  Breasts that have increased in firmness, weight, and size 1-3 hours after feeding.  Breasts that are softer immediately after breastfeeding.  Increased milk volume, as well as a change in milk consistency and color by the fifth day of breastfeeding.  Nipples that are not sore, cracked, or bleeding. Signs That Your [employer] is Getting Enough Milk  Wetting at least 3 diapers in a 24-hour period. The urine should be clear and pale yellow by age 69 days.  At least 3 stools in a 24-hour period by age 69 days. The stool should be soft and yellow.  At least 3 stools in a 24-hour period by age 3 days. The stool should be seedy and yellow.  No loss of weight greater than 10% of birth weight during the first 48 days of age.  Average weight gain of 4-7 ounces (113-198 g) per week after age 7  days.  Consistent daily weight gain by age 96 days, without weight loss after the age of 2 weeks. After a feeding, your baby may spit up a small amount. This is common. BREASTFEEDING FREQUENCY AND DURATION Frequent feeding will help you make more milk and can prevent sore nipples and breast engorgement. Breastfeed when you feel the need to reduce the fullness of your breasts or when your baby shows signs of hunger. This is called "breastfeeding on demand." Avoid introducing a pacifier to your baby while you are working to establish breastfeeding (the first 4-6 weeks after your baby is born). After this time you may choose to use a pacifier. Research has shown that pacifier use during the first year of a baby's life decreases the risk of sudden infant death syndrome (SIDS). Allow your baby to feed on each breast as long as he or she wants. Breastfeed until your baby is finished feeding. When your baby unlatches or falls asleep while feeding from the first breast, offer the second breast. Because newborns are often sleepy in the first few weeks of life, you may need to awaken your baby to get him or her to feed. Breastfeeding times will vary from baby to baby. However, the following rules can serve as a guide to help you ensure that your baby is properly fed:  Newborns (babies 42 weeks of age or younger) may breastfeed every 1-3 hours.  Newborns should not go longer than 3 hours during the day or 5 hours during the night without breastfeeding.  You should breastfeed your baby a minimum of 8 times in a 24-hour period until you begin to introduce solid foods to your baby at around 65 months of age. BREAST MILK PUMPING Pumping and storing breast milk allows you to ensure that your baby is exclusively fed your breast milk, even at times when you are unable to breastfeed. This is especially important if you are going back to work while you are still breastfeeding or when you are not able to be present during  feedings. Your lactation consultant can give you guidelines on how long it is safe to store breast milk. A breast pump is a machine that allows you to pump milk from your breast into a sterile bottle. The pumped breast milk can  then be stored in a refrigerator or freezer. Some breast pumps are operated by hand, while others use electricity. Ask your lactation consultant which type will work best for you. Breast pumps can be purchased, but some hospitals and breastfeeding support groups lease breast pumps on a monthly basis. A lactation consultant can teach you how to hand express breast milk, if you prefer not to use a pump. CARING FOR YOUR BREASTS WHILE YOU BREASTFEED Nipples can become dry, cracked, and sore while breastfeeding. The following recommendations can help keep your breasts moisturized and healthy:  Avoid using soap on your nipples.  Wear a supportive bra. Although not required, special nursing bras and tank tops are designed to allow access to your breasts for breastfeeding without taking off your entire bra or top. Avoid wearing underwire-style bras or extremely tight bras.  Air dry your nipples for 3-78mnutes after each feeding.  Use only cotton bra pads to absorb leaked breast milk. Leaking of breast milk between feedings is normal.  Use lanolin on your nipples after breastfeeding. Lanolin helps to maintain your skin's normal moisture barrier. If you use pure lanolin, you do not need to wash it off before feeding your baby again. Pure lanolin is not toxic to your baby. You may also hand express a few drops of breast milk and gently massage that milk into your nipples and allow the milk to air dry. In the first few weeks after giving birth, some women experience extremely full breasts (engorgement). Engorgement can make your breasts feel heavy, warm, and tender to the touch. Engorgement peaks within 3-5 days after you give birth. The following recommendations can help ease  engorgement:  Completely empty your breasts while breastfeeding or pumping. You may want to start by applying warm, moist heat (in the shower or with warm water-soaked hand towels) just before feeding or pumping. This increases circulation and helps the milk flow. If your baby does not completely empty your breasts while breastfeeding, pump any extra milk after he or she is finished.  Wear a snug bra (nursing or regular) or tank top for 1-2 days to signal your body to slightly decrease milk production.  Apply ice packs to your breasts, unless this is too uncomfortable for you.  Make sure that your baby is latched on and positioned properly while breastfeeding. If engorgement persists after 48 hours of following these recommendations, contact your health care provider or a lScience writer OVERALL HEALTH CARE RECOMMENDATIONS WHILE BREASTFEEDING  Eat healthy foods. Alternate between meals and snacks, eating 3 of each per day. Because what you eat affects your breast milk, some of the foods may make your baby more irritable than usual. Avoid eating these foods if you are sure that they are negatively affecting your baby.  Drink milk, fruit juice, and water to satisfy your thirst (about 10 glasses a day).  Rest often, relax, and continue to take your prenatal vitamins to prevent fatigue, stress, and anemia.  Continue breast self-awareness checks.  Avoid chewing and smoking tobacco. Chemicals from cigarettes that pass into breast milk and exposure to secondhand smoke may harm your baby.  Avoid alcohol and drug use, including marijuana. Some medicines that may be harmful to your baby can pass through breast milk. It is important to ask your health care provider before taking any medicine, including all over-the-counter and prescription medicine as well as vitamin and herbal supplements. It is possible to become pregnant while breastfeeding. If birth control is desired, ask your health care  provider about options that will be safe for your baby. SEEK MEDICAL CARE IF:  You feel like you want to stop breastfeeding or have become frustrated with breastfeeding.  You have painful breasts or nipples.  Your nipples are cracked or bleeding.  Your breasts are red, tender, or warm.  You have a swollen area on either breast.  You have a fever or chills.  You have nausea or vomiting.  You have drainage other than breast milk from your nipples.  Your breasts do not become full before feedings by the fifth day after you give birth.  You feel sad and depressed.  Your baby is too sleepy to eat well.  Your baby is having trouble sleeping.   Your baby is wetting less than 3 diapers in a 24-hour period.  Your baby has less than 3 stools in a 24-hour period.  Your baby's skin or the white part of his or her eyes becomes yellow.   Your baby is not gaining weight by 17 days of age. SEEK IMMEDIATE MEDICAL CARE IF:  Your baby is overly tired (lethargic) and does not want to wake up and feed.  Your baby develops an unexplained fever.   This information is not intended to replace advice given to you by your health care provider. Make sure you discuss any questions you have with your health care provider.   Document Released: 07/01/2005 Document Revised: 03/22/2015 Document Reviewed: 12/23/2012 Elsevier Interactive Patient Education [employer].

## 2016-02-09 NOTE — Progress Notes (Signed)
Patient ID: Kara Medina, female   DOB: 08-20-1975, 40 y.o.   MRN: [id number] PPD # 1 SVD  S:  Reports feeling well. Ready for early discharge.             Tolerating po/ No nausea or vomiting             Bleeding is light             Pain controlled with ibuprofen (OTC)             Up ad lib / ambulatory / voiding without difficulties    Newborn  Information for the patient's newborn:  Mayerly, Kaman [[id number]]  female  breast feeding  / Circumcision planning   O:  A & O x 3, in no apparent distress              VS:  Vitals:   02/08/16 2300 02/08/16 2305 02/09/16 0005 02/09/16 0525  BP: (!) 143/98 137/72 140/85 124/75  Pulse: (!) 121 (!) 113 (!) 106 91  Resp: 20 20 18 18   Temp: 98.9 F (37.2 C)  98.2 F (36.8 C) 98.2 F (36.8 C)  TempSrc: Oral  Axillary Oral  Weight:      Height:        LABS:  Recent Labs  02/08/16 2304 02/09/16 0525  WBC 13.1* 14.2*  HGB 10.3* 10.0*  HCT 31.3* 30.2*  PLT 182 181    Blood type: O POS (07/27 0816)  Rubella: Immune (02/14 0000)   I&O: I/O last 3 completed shifts: In: -  Out: 200 [Blood:200]          No intake/output data recorded.  Lungs: Clear and unlabored  Heart: regular rate and rhythm / no murmurs  Abdomen: soft, non-tender, non-distended             Fundus: firm, non-tender, U-2  Perineum: intact, periuretheral laceration healing well  Lochia: minimal  Extremities: No edema, no calf pain or tenderness, No Homans    A/P: PPD # 1 40 y.o., [id number]   Principal Problem:    Postpartum care following vaginal delivery (7/27)  Active Problems:    LGA (large for gestational age) fetus      Doing well - stable status  Routine post partum orders  Early discharge later today (after 8:30 pm)    Laury Deep, M, MSN, CNM 02/09/2016, 11:35 AM

## 2016-02-09 NOTE — Lactation Note (Addendum)
This note was copied from a baby's chart. Lactation Consultation Note Experienced mom but not experienced BF. Mom didn't BF her other children. Stated she wanted to try it. Plans on breast formula. Will be going back to work in 6 weeks. Baby will be going on formula then. Mom asked about pumping, stated she may pump some. Mom stated she wasn't sure about BF but the baby is BF well. Encouraged STS. Mom has pendulum triangular shaped breast. Encouraged to roll wash cloth under breast to lift them slightly since nipple at the bottom of breast. Hand expression taught w/colostrum noted. Assisted in positioning. Baby opens wide, heard swallows. Mom encouraged to feed baby 8-12 times/24 hours and with feeding cues. Referred to Baby and Me Book in Breastfeeding section Pg. 22-23 for position options and Proper latch demonstration. Discussed newborn behavior and feeding habits. [facility] brochure given w/resources, support groups and [facility] services.  Patient Name: Kara Medina [facility]O Date: 02/09/2016 Reason for consult: Initial assessment   Maternal Data Has patient been taught Hand Expression?: Yes Does the patient have breastfeeding experience prior to this delivery?: No  Feeding Feeding Type: Breast Fed Length of feed: 20 min  LATCH Score/Interventions Latch: Repeated attempts needed to sustain latch, nipple held in mouth throughout feeding, stimulation needed to elicit sucking reflex. Intervention(s): Adjust position;Assist with latch;Breast massage;Breast compression  Audible Swallowing: Spontaneous and intermittent Intervention(s): Skin to skin;Hand expression;Alternate breast massage  Type of Nipple: Everted at rest and after stimulation  Comfort (Breast/Nipple): Soft / non-tender     Hold (Positioning): Assistance needed to correctly position infant at breast and maintain latch. Intervention(s): Breastfeeding basics reviewed;Support Pillows;Position options;Skin to skin  LATCH Score:  8  Lactation Tools Discussed/Used WIC Program: No   Consult Status Consult Status: Follow-up Date: 02/09/16 (in pm) Follow-up type: In-patient    Kara Medina, Kara Medina 02/09/2016, 6:09 AM

## 2016-02-09 NOTE — Discharge Summary (Signed)
OB Discharge Summary     Patient Name: Kara Medina DOB: March 10, 1976 MRN: [id number]  Date of admission: 02/08/2016 Delivering MD: Princess Bruins   Date of discharge: 02/09/2016  Admitting diagnosis: INDUCTION for Mild PIH/GDMA1/GBS pos Intrauterine pregnancy: [redacted]w[redacted]d    Secondary diagnosis:  Principal Problem:   Postpartum care following vaginal delivery (7/27) Active Problems:   LGA (large for gestational age) fetus   Postpartum state  Additional problems: none     Discharge diagnosis: Term Pregnancy Delivered                                                                                                Post partum procedures:none  Augmentation: AROM and Pitocin  Complications: None  Hospital course:  Induction of Labor With Vaginal Delivery   40y.o. yo G3P2004 at 40w4das admitted to the hospital 02/08/2016 for induction of labor.  Indication for induction: Preeclampsia, A1 DM and GBS pos.  Patient had an uncomplicated labor course as follows: Membrane Rupture Time/Date: 3:39 PM ,02/08/2016   Intrapartum Procedures: Episiotomy: None [1]                                         Lacerations:  Periurethral [8]  Patient had delivery of a Viable infant.  Information for the patient's newborn:  HaCatalyna, Reilly0[[id number]]Delivery Method: Vaginal, Spontaneous Delivery (Filed from Delivery Summary)   02/08/2016  Details of delivery can be found in separate delivery note.  Patient had a routine postpartum course. Patient is discharged home 02/09/16.   Physical exam Vitals:   02/08/16 2300 02/08/16 2305 02/09/16 0005 02/09/16 0525  BP: (!) 143/98 137/72 140/85 124/75  Pulse: (!) 121 (!) 113 (!) 106 91  Resp: 20 20 18 18   Temp: 98.9 F (37.2 C)  98.2 F (36.8 C) 98.2 F (36.8 C)  TempSrc: Oral  Axillary Oral  Weight:      Height:       General: alert, cooperative and no distress Lochia: appropriate Uterine Fundus: firm, midline, U-2 DVT Evaluation: No evidence  of DVT seen on physical exam. Negative Homan's sign. No cords or calf tenderness. No significant calf/ankle edema. Labs: Lab Results  Component Value Date   WBC 14.2 (H) 02/09/2016   HGB 10.0 (L) 02/09/2016   HCT 30.2 (L) 02/09/2016   MCV 92.9 02/09/2016   PLT 181 02/09/2016   CMP Latest Ref Rng & Units 02/09/2016  Glucose 65 - 99 mg/dL 91  BUN 6 - 20 mg/dL 6  Creatinine 0.44 - 1.00 mg/dL 0.64  Sodium 135 - 145 mmol/L 133(L)  Potassium 3.5 - 5.1 mmol/L 4.0  Chloride 101 - 111 mmol/L 105  CO2 22 - 32 mmol/L 21(L)  Calcium 8.9 - 10.3 mg/dL 8.6(L)  Total Protein 6.5 - 8.1 g/dL 6.1(L)  Total Bilirubin 0.3 - 1.2 mg/dL 0.4  Alkaline Phos 38 - 126 U/L 83  AST 15 - 41 U/L 25  ALT 14 - 54 U/L 17  Discharge instruction: per After Visit Summary and "Baby and Me Booklet".  After visit meds:    Medication List    TAKE these medications   ferrous sulfate 325 (65 FE) MG tablet Take 325 mg by mouth daily with breakfast.   ibuprofen 600 MG tablet Commonly known as:  ADVIL,MOTRIN Take 1 tablet (600 mg total) by mouth every 6 (six) hours.   multivitamin-prenatal 27-0.8 MG Tabs tablet Take 1 tablet by mouth daily at 12 noon.       Diet: routine diet  Activity: Advance as tolerated. Pelvic rest for 6 weeks.   Outpatient follow up:6 weeks Follow up Appt:No future appointments. Follow up Visit:No Follow-up on file.  Postpartum contraception: Undecided  Newborn Data: Live born female on 02/08/2016 Birth Weight: 7 lb 14.5 oz (3585 g) APGAR: 8, 9  Baby Feeding: Breast Disposition:home with mother   02/09/2016 Laury Deep, Jerilynn Mages, CNM

## 2016-02-11 ENCOUNTER — Inpatient Hospital Stay (HOSPITAL_COMMUNITY): Admission: AD | Admit: 2016-02-11 | Payer: Self-pay | Source: Ambulatory Visit | Admitting: Obstetrics & Gynecology

## 2020-03-17 ENCOUNTER — Telehealth: Payer: Self-pay | Admitting: General Practice

## 2020-03-17 NOTE — Telephone Encounter (Signed)
ok 

## 2020-03-17 NOTE — Telephone Encounter (Signed)
This patient was recommended by her sister Devonne Doughty to establish care with you.  Can I schedule this patient for a new patient with you?

## 2020-03-17 NOTE — Telephone Encounter (Signed)
Patient is scheduled for 04/21/2020 at 2:30 PM

## 2020-03-21 ENCOUNTER — Other Ambulatory Visit: Payer: Self-pay

## 2020-03-21 ENCOUNTER — Ambulatory Visit
Admission: RE | Admit: 2020-03-21 | Discharge: 2020-03-21 | Disposition: A | Discharge status: Home / Self Care | Payer: 59 | Source: Ambulatory Visit

## 2020-03-21 ENCOUNTER — Other Ambulatory Visit: Payer: Self-pay | Admitting: Family Medicine

## 2020-03-21 DIAGNOSIS — Z1231 Encounter for screening mammogram for malignant neoplasm of breast: Secondary | ICD-10-CM

## 2020-04-21 ENCOUNTER — Ambulatory Visit (INDEPENDENT_AMBULATORY_CARE_PROVIDER_SITE_OTHER): Payer: 59 | Admitting: Family Medicine

## 2020-04-21 ENCOUNTER — Encounter: Payer: Self-pay | Admitting: Family Medicine

## 2020-04-21 ENCOUNTER — Other Ambulatory Visit: Payer: Self-pay

## 2020-04-21 VITALS — BP 132/94 | HR 95 | Temp 98.2°F | Ht 66.0 in | Wt 250.4 lb

## 2020-04-21 DIAGNOSIS — Z13220 Encounter for screening for lipoid disorders: Secondary | ICD-10-CM

## 2020-04-21 DIAGNOSIS — R5383 Other fatigue: Secondary | ICD-10-CM | POA: Diagnosis not present

## 2020-04-21 DIAGNOSIS — R0683 Snoring: Secondary | ICD-10-CM | POA: Diagnosis not present

## 2020-04-21 DIAGNOSIS — R030 Elevated blood-pressure reading, without diagnosis of hypertension: Secondary | ICD-10-CM

## 2020-04-21 DIAGNOSIS — Z8632 Personal history of gestational diabetes: Secondary | ICD-10-CM

## 2020-04-21 NOTE — Progress Notes (Signed)
Kara Medina DOB: 1976/06/21 Encounter date: 04/21/2020  This is a 44 y.o. female who presents to establish care. Chief Complaint  Patient presents with  . Establish Care    History of present illness: Worried about overall health and wellbeing. She is 44 yrs old and has put off going to doctor. Worried about being told something bad. Last regular doc was 2014-15.   Worries about diabetes, weight, blood pressure, cancer (mom just dx with breast cancer).   Has tremors - dx with in 1999 (senior year in college); haven't gone away. Both hands. Does bother her on daily basis. People bring to her attention. Initially told it was due to her being stressed and being in last semester of school. Does feel it is worse now than it was then.    Has been to ER monthly with son who is autistic due to issues with feeding. Now has feeding tube which has at least stopped them from having to go to ER.   No issues with light headedness, dizziness. Rare to get headache. If she does it is in area of posterior left head where she had surgery.   Does snore; husband even recorded it. Does state that she stops breathing while sleeping. Not rested in the morning. States that this has been going on for years.   Has struggled with weight loss for years. Then snoring worsened as she gained. Most she has lost was 30lbs. Phentermine helps.   Past Medical History:  Diagnosis Date  . Anemia   . Gestational diabetes    diet controlled  . Postpartum care following vaginal delivery (7/27) 02/09/2016  . Pregnancy induced hypertension   . Trigeminal neuralgia    2012 corrected with surgery   Past Surgical History:  Procedure Laterality Date  . APPENDECTOMY  2008  . TRIGEMINAL NERVE DECOMPRESSION  2012   Allergies  Allergen Reactions  . Amoxicillin Hives    Has patient had a PCN reaction causing immediate rash, facial/tongue/throat swelling, SOB or lightheadedness with hypotension: Yes Has patient had a PCN  reaction causing severe rash involving mucus membranes or skin necrosis: No Has patient had a PCN reaction that required hospitalization No Has patient had a PCN reaction occurring within the last 10 years: No If all of the above answers are "NO", then may proceed with Cephalosporin use.    Current Meds  Medication Sig  . Multiple Vitamin (MULTIVITAMIN ADULT PO) Take by mouth.   Social History   Tobacco Use  . Smoking status: Never Smoker  . Smokeless tobacco: Never Used  Substance Use Topics  . Alcohol use: No   Family History  Problem Relation Age of Onset  . Cancer Other   . Hypertension Other   . Healthy Father   . Cancer Paternal Grandfather        stomach, lung  . Hypertension Mother   . Breast cancer Mother 60  . Hypertension Maternal Grandmother   . Arthritis Maternal Grandmother   . Hypertension Maternal Grandfather   . Other Brother        ?prediabetic  . Diabetes Paternal Grandmother   . Hypertension Paternal Grandmother   . Autism Son      Review of Systems  Constitutional: Negative for chills, fatigue and fever.  Respiratory: Negative for cough, chest tightness, shortness of breath and wheezing.   Cardiovascular: Negative for chest pain, palpitations and leg swelling.    Objective:  BP (!) 132/94 (BP Location: Left Arm, Patient Position: Sitting, Cuff  Size: Large)   Pulse 95   Temp 98.2 F (36.8 C) (Oral)   Ht 5' 6"  (1.676 m)   Wt 250 lb 6.4 oz (113.6 kg)   LMP 04/18/2020 (Exact Date)   Breastfeeding No   BMI 40.42 kg/m   Weight: 250 lb 6.4 oz (113.6 kg)   BP Readings from Last 3 Encounters:  04/21/20 (!) 132/94  02/09/16 (!) 151/94   Wt Readings from Last 3 Encounters:  04/21/20 250 lb 6.4 oz (113.6 kg)  02/08/16 269 lb (122 kg)  12/19/15 264 lb (119.7 kg)    Physical Exam Constitutional:      General: She is not in acute distress.    Appearance: She is well-developed.  Cardiovascular:     Rate and Rhythm: Normal rate and regular  rhythm.     Heart sounds: Normal heart sounds. No murmur heard.  No friction rub.  Pulmonary:     Effort: Pulmonary effort is normal. No respiratory distress.     Breath sounds: Normal breath sounds. No wheezing or rales.  Musculoskeletal:     Right lower leg: No edema.     Left lower leg: No edema.  Neurological:     Mental Status: She is alert and oriented to person, place, and time.     Comments: Does have tremor with intention.   Psychiatric:        Behavior: Behavior normal.     Assessment/Plan:  1. Snoring I am going to refer her for further evaluation.  We discussed importance of getting this corrected if she does not fact have any sleep apnea and how this can affect whole body including blood pressure, strain on heart/lungs.  2. Elevated blood pressure reading I encouraged her to check blood pressure at home.  Going to base treatment on single in office reading.  Follow-up pending blood work.  3. Fatigue, unspecified type - CBC with Differential/Platelet; Future - Comprehensive metabolic panel; Future - TSH; Future - Vitamin B12; Future - VITAMIN D 25 Hydroxy (Vit-D Deficiency, Fractures); Future - Iron, TIBC and Ferritin Panel; Future  4. Lipid screening - Lipid panel; Future  5. History of gestational diabetes - Hemoglobin A1c; Future  Return for pending bloodwork results.  Micheline Rough, MD

## 2020-04-22 LAB — CBC WITH DIFFERENTIAL/PLATELET
Absolute Monocytes: 684 cells/uL (ref 200–950)
Basophils Absolute: 48 cells/uL (ref 0–200)
Basophils Relative: 0.9 %
Eosinophils Absolute: 228 cells/uL (ref 15–500)
Eosinophils Relative: 4.3 %
HCT: 35.2 % (ref 35.0–45.0)
Hemoglobin: 11.4 g/dL — ABNORMAL LOW (ref 11.7–15.5)
Lymphs Abs: 1595 cells/uL (ref 850–3900)
MCH: 30.3 pg (ref 27.0–33.0)
MCHC: 32.4 g/dL (ref 32.0–36.0)
MCV: 93.6 fL (ref 80.0–100.0)
MPV: 10.9 fL (ref 7.5–12.5)
Monocytes Relative: 12.9 %
Neutro Abs: 2745 cells/uL (ref 1500–7800)
Neutrophils Relative %: 51.8 %
Platelets: 306 10*3/uL (ref 140–400)
RBC: 3.76 10*6/uL — ABNORMAL LOW (ref 3.80–5.10)
RDW: 11 % (ref 11.0–15.0)
Total Lymphocyte: 30.1 %
WBC: 5.3 10*3/uL (ref 3.8–10.8)

## 2020-04-22 LAB — COMPREHENSIVE METABOLIC PANEL
AG Ratio: 1 (calc) (ref 1.0–2.5)
ALT: 14 U/L (ref 6–29)
AST: 15 U/L (ref 10–30)
Albumin: 3.9 g/dL (ref 3.6–5.1)
Alkaline phosphatase (APISO): 65 U/L (ref 31–125)
BUN: 14 mg/dL (ref 7–25)
CO2: 29 mmol/L (ref 20–32)
Calcium: 9.3 mg/dL (ref 8.6–10.2)
Chloride: 104 mmol/L (ref 98–110)
Creat: 0.76 mg/dL (ref 0.50–1.10)
Globulin: 4 g/dL (calc) — ABNORMAL HIGH (ref 1.9–3.7)
Glucose, Bld: 92 mg/dL (ref 65–99)
Potassium: 4.9 mmol/L (ref 3.5–5.3)
Sodium: 137 mmol/L (ref 135–146)
Total Bilirubin: 0.3 mg/dL (ref 0.2–1.2)
Total Protein: 7.9 g/dL (ref 6.1–8.1)

## 2020-04-22 LAB — LIPID PANEL
Cholesterol: 104 mg/dL (ref <200)
HDL: 37 mg/dL — ABNORMAL LOW (ref >=50)
LDL Cholesterol (Calc): 52 mg/dL (calc)
Non-HDL Cholesterol (Calc): 67 mg/dL (calc) (ref <130)
Total CHOL/HDL Ratio: 2.8 (calc) (ref <5.0)
Triglycerides: 70 mg/dL (ref <150)

## 2020-04-22 LAB — IRON,TIBC AND FERRITIN PANEL
%SAT: 13 % (calc) — ABNORMAL LOW (ref 16–45)
Ferritin: 76 ng/mL (ref 16–232)
Iron: 45 ug/dL (ref 40–190)
TIBC: 351 mcg/dL (calc) (ref 250–450)

## 2020-04-22 LAB — VITAMIN B12: Vitamin B-12: 486 pg/mL (ref 200–1100)

## 2020-04-22 LAB — HEMOGLOBIN A1C
Hgb A1c MFr Bld: 4.8 % of total Hgb (ref <5.7)
Mean Plasma Glucose: 91 (calc)
eAG (mmol/L): 5 (calc)

## 2020-04-22 LAB — TSH: TSH: 0.67 mIU/L

## 2020-04-22 LAB — VITAMIN D 25 HYDROXY (VIT D DEFICIENCY, FRACTURES): Vit D, 25-Hydroxy: 16 ng/mL — ABNORMAL LOW (ref 30–100)

## 2020-04-27 ENCOUNTER — Encounter: Payer: Self-pay | Admitting: Family Medicine

## 2020-04-28 MED ORDER — VITAMIN D (ERGOCALCIFEROL) 1.25 MG (50000 UNIT) PO CAPS: 50000.0000 [IU] | ORAL_CAPSULE | ORAL | 1 refills | Status: DC

## 2020-04-28 NOTE — Addendum Note (Signed)
Addended by: Agnes Lawrence on: 04/28/2020 05:46 PM   Modules accepted: Orders

## 2020-04-29 NOTE — Telephone Encounter (Signed)
See results note from 10/15.  Per the pt all questions were reviewed.

## 2020-05-24 ENCOUNTER — Institutional Professional Consult (permissible substitution): Payer: 59 | Admitting: Neurology

## 2020-06-19 ENCOUNTER — Institutional Professional Consult (permissible substitution): Payer: 59 | Admitting: Neurology

## 2021-03-21 IMAGING — MG DIGITAL SCREENING BILAT W/ TOMO W/ CAD
6 of 12 series · 6 of 36 positions shown · non-contrast
Comparison: None.

CLINICAL DATA: Screening.

EXAM:
DIGITAL SCREENING BILATERAL MAMMOGRAM WITH TOMO AND CAD

[L CC synth-2D (1 of 2)]
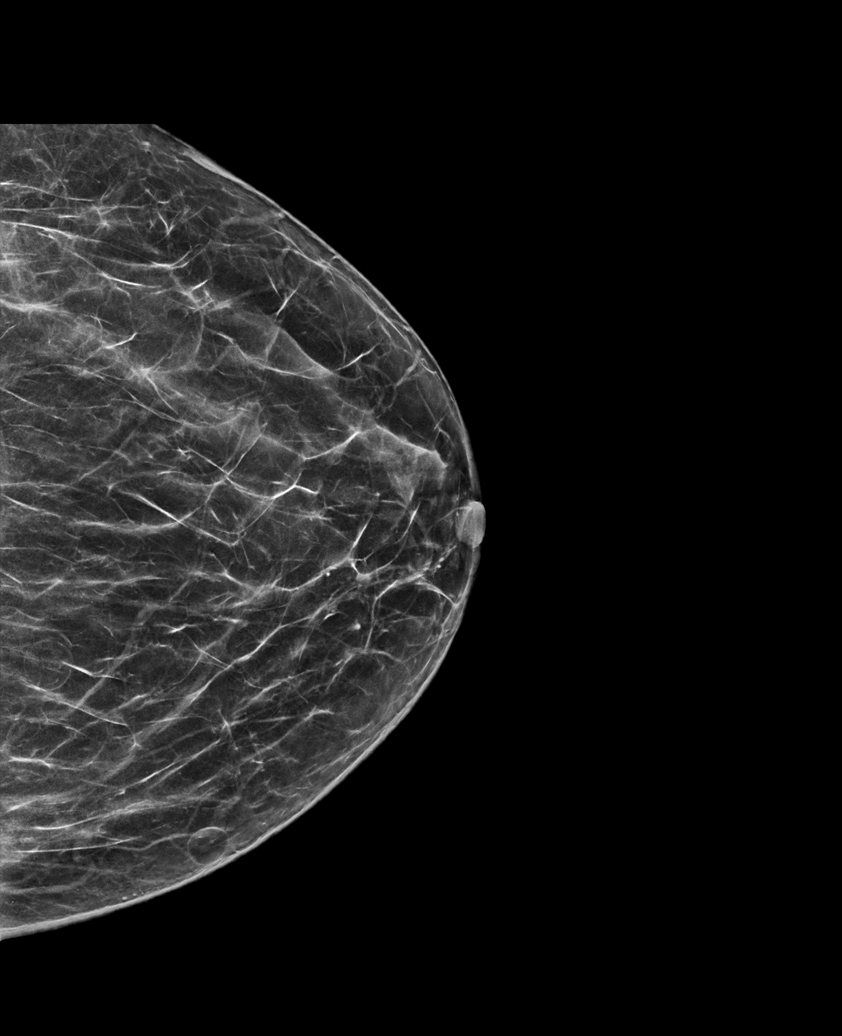

[L CC synth-2D (2 of 2)]
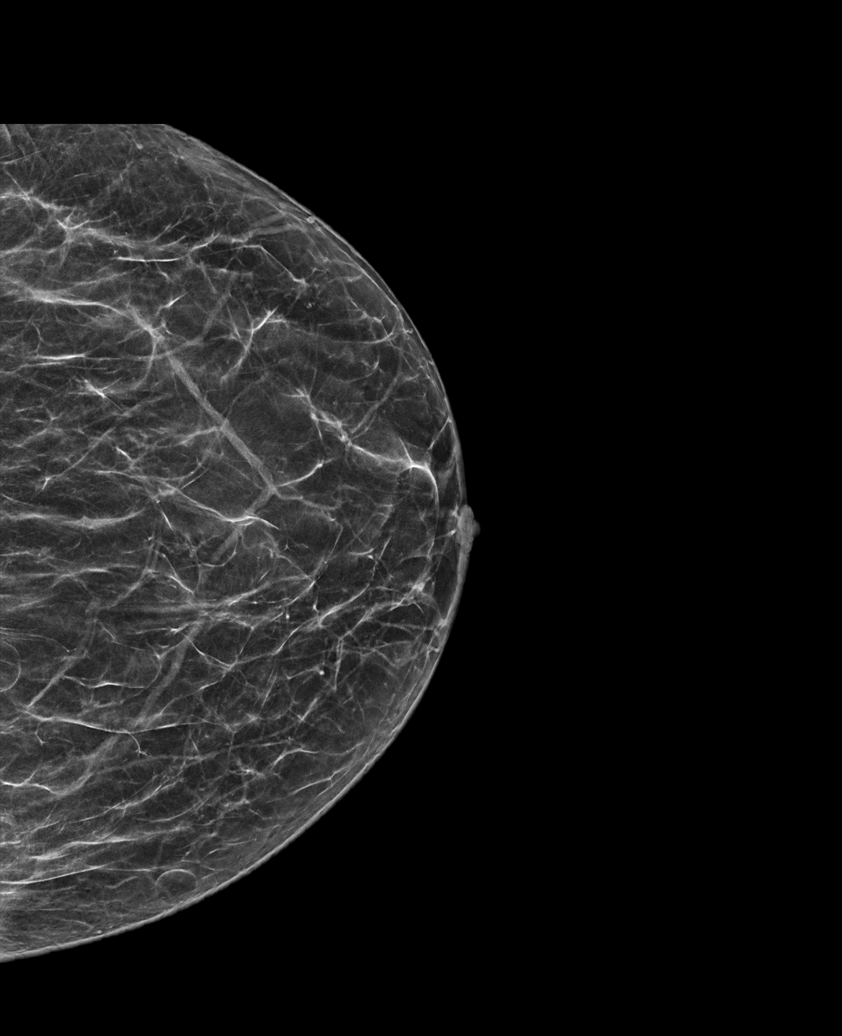

[R CC synth-2D]
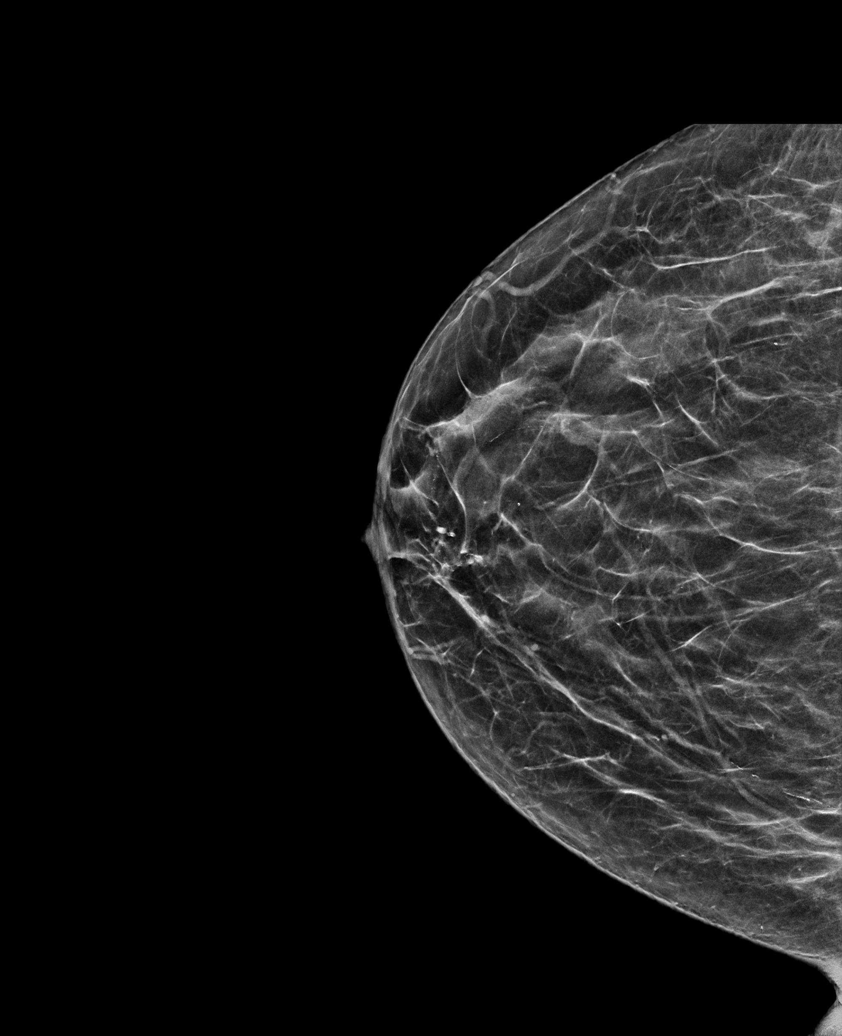

[R MLO synth-2D]
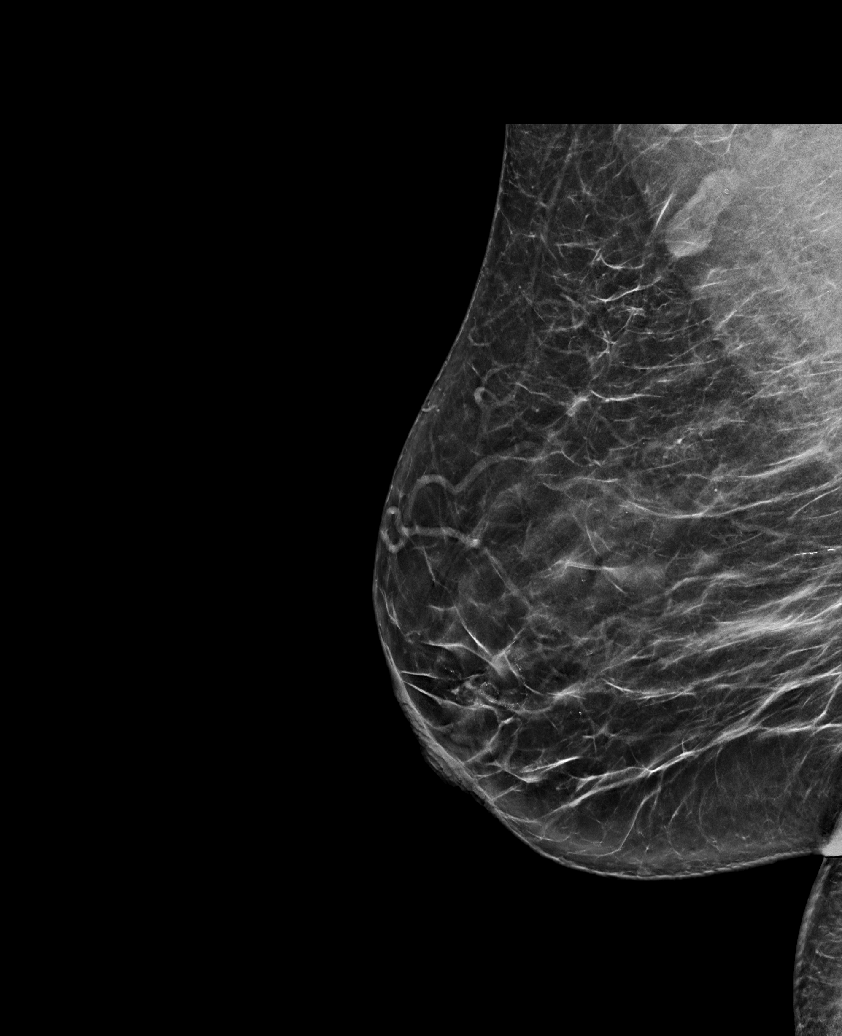

[L MLO synth-2D (1 of 2)]
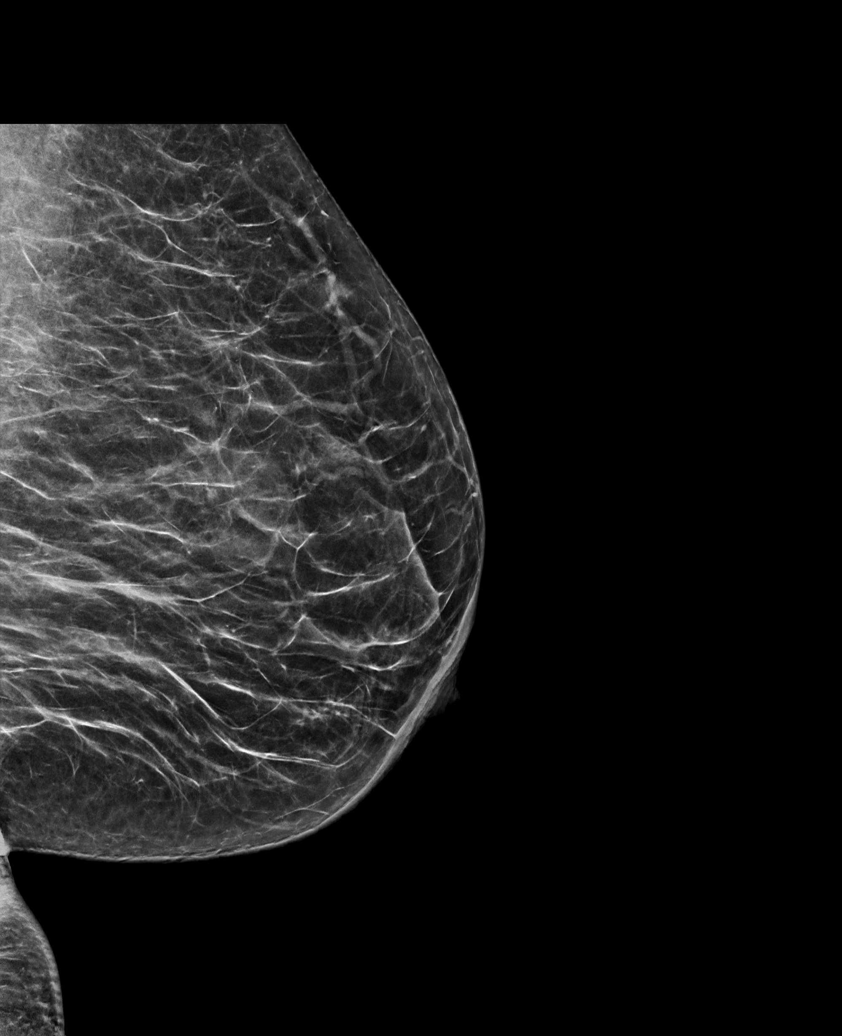

[L MLO synth-2D (2 of 2)]
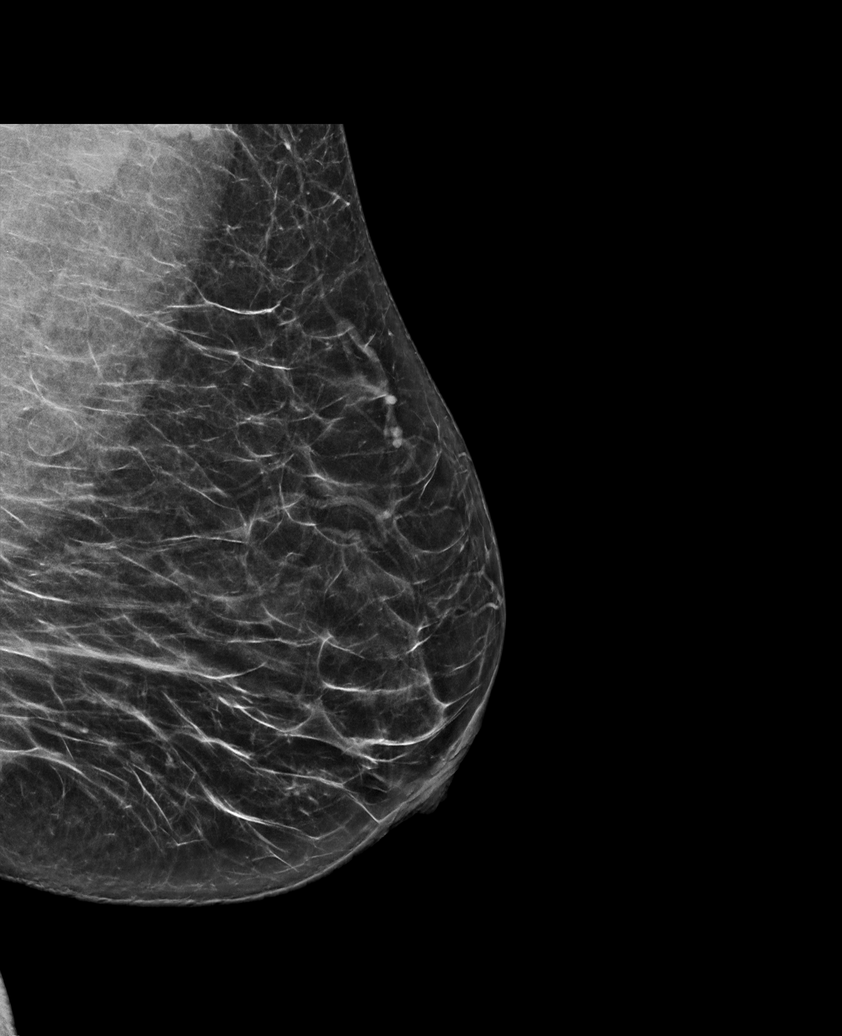

[6 of 36 positions shown; findings below may reference images not displayed]

ACR Breast Density Category b: There are scattered areas of
fibroglandular density.
FINDINGS: There are no findings suspicious for malignancy. Images were
processed with CAD.
IMPRESSION: No mammographic evidence of malignancy. A result letter of this
screening mammogram will be mailed directly to the patient.

RECOMMENDATION:
Screening mammogram in one year. (Code:Y5-G-EJ6)

BI-RADS CATEGORY  1: Negative.

## 2021-06-29 ENCOUNTER — Other Ambulatory Visit: Payer: Self-pay | Admitting: Family Medicine

## 2021-06-29 DIAGNOSIS — Z1231 Encounter for screening mammogram for malignant neoplasm of breast: Secondary | ICD-10-CM

## 2021-07-27 ENCOUNTER — Ambulatory Visit
Admission: RE | Admit: 2021-07-27 | Discharge: 2021-07-27 | Disposition: A | Discharge status: Home / Self Care | Payer: 59 | Source: Ambulatory Visit

## 2021-07-27 DIAGNOSIS — Z1231 Encounter for screening mammogram for malignant neoplasm of breast: Secondary | ICD-10-CM

## 2021-08-22 ENCOUNTER — Ambulatory Visit (INDEPENDENT_AMBULATORY_CARE_PROVIDER_SITE_OTHER): Payer: 59 | Admitting: Family Medicine

## 2021-08-22 ENCOUNTER — Encounter: Payer: Self-pay | Admitting: Family Medicine

## 2021-08-22 VITALS — BP 124/84 | HR 90 | Temp 98.4°F | Ht 66.5 in | Wt 244.8 lb

## 2021-08-22 DIAGNOSIS — E559 Vitamin D deficiency, unspecified: Secondary | ICD-10-CM

## 2021-08-22 DIAGNOSIS — Z8632 Personal history of gestational diabetes: Secondary | ICD-10-CM

## 2021-08-22 DIAGNOSIS — E6609 Other obesity due to excess calories: Secondary | ICD-10-CM

## 2021-08-22 DIAGNOSIS — Z1211 Encounter for screening for malignant neoplasm of colon: Secondary | ICD-10-CM

## 2021-08-22 DIAGNOSIS — Z13220 Encounter for screening for lipoid disorders: Secondary | ICD-10-CM | POA: Diagnosis not present

## 2021-08-22 DIAGNOSIS — Z6838 Body mass index (BMI) 38.0-38.9, adult: Secondary | ICD-10-CM

## 2021-08-22 DIAGNOSIS — Z131 Encounter for screening for diabetes mellitus: Secondary | ICD-10-CM

## 2021-08-22 DIAGNOSIS — R779 Abnormality of plasma protein, unspecified: Secondary | ICD-10-CM

## 2021-08-22 DIAGNOSIS — D649 Anemia, unspecified: Secondary | ICD-10-CM | POA: Diagnosis not present

## 2021-08-22 DIAGNOSIS — Z0000 Encounter for general adult medical examination without abnormal findings: Secondary | ICD-10-CM | POA: Diagnosis not present

## 2021-08-22 LAB — COMPREHENSIVE METABOLIC PANEL
ALT: 14 U/L (ref 0–35)
AST: 15 U/L (ref 0–37)
Albumin: 3.8 g/dL (ref 3.5–5.2)
Alkaline Phosphatase: 57 U/L (ref 39–117)
BUN: 13 mg/dL (ref 6–23)
CO2: 25 mEq/L (ref 19–32)
Calcium: 9 mg/dL (ref 8.4–10.5)
Chloride: 104 mEq/L (ref 96–112)
Creatinine, Ser: 0.81 mg/dL (ref 0.40–1.20)
GFR: 87.43 mL/min (ref >60.00)
Glucose, Bld: 84 mg/dL (ref 70–99)
Potassium: 4.3 mEq/L (ref 3.5–5.1)
Sodium: 135 mEq/L (ref 135–145)
Total Bilirubin: 0.6 mg/dL (ref 0.2–1.2)
Total Protein: 9 g/dL — ABNORMAL HIGH (ref 6.0–8.3)

## 2021-08-22 LAB — CBC WITH DIFFERENTIAL/PLATELET
Basophils Absolute: 0 10*3/uL (ref 0.0–0.1)
Basophils Relative: 0.7 % (ref 0.0–3.0)
Eosinophils Absolute: 0.2 10*3/uL (ref 0.0–0.7)
Eosinophils Relative: 3.2 % (ref 0.0–5.0)
HCT: 36.1 % (ref 36.0–46.0)
Hemoglobin: 11.6 g/dL — ABNORMAL LOW (ref 12.0–15.0)
Lymphocytes Relative: 38.6 % (ref 12.0–46.0)
Lymphs Abs: 2 10*3/uL (ref 0.7–4.0)
MCHC: 32 g/dL (ref 30.0–36.0)
MCV: 92.9 fl (ref 78.0–100.0)
Monocytes Absolute: 0.5 10*3/uL (ref 0.1–1.0)
Monocytes Relative: 10.4 % (ref 3.0–12.0)
Neutro Abs: 2.4 10*3/uL (ref 1.4–7.7)
Neutrophils Relative %: 47.1 % (ref 43.0–77.0)
Platelets: 294 10*3/uL (ref 150.0–400.0)
RBC: 3.89 Mil/uL (ref 3.87–5.11)
RDW: 12.5 % (ref 11.5–15.5)
WBC: 5.1 10*3/uL (ref 4.0–10.5)

## 2021-08-22 LAB — LIPID PANEL
Cholesterol: 105 mg/dL (ref 0–200)
HDL: 36.5 mg/dL — ABNORMAL LOW (ref >39.00)
LDL Cholesterol: 60 mg/dL (ref 0–99)
NonHDL: 68.37
Total CHOL/HDL Ratio: 3
Triglycerides: 42 mg/dL (ref 0.0–149.0)
VLDL: 8.4 mg/dL (ref 0.0–40.0)

## 2021-08-22 LAB — HEMOGLOBIN A1C: Hgb A1c MFr Bld: 4.9 % (ref 4.6–6.5)

## 2021-08-22 LAB — TSH: TSH: 0.8 u[IU]/mL (ref 0.35–5.50)

## 2021-08-22 NOTE — Progress Notes (Addendum)
Kara Medina DOB: 1976/04/27 Encounter date: 08/22/2021  This is a 46 y.o. female who presents for complete physical   History of present illness/Additional concerns: Last visit was 04/2020. Mammogram 07/27/21: normal  Son is doing better, in [place]. He is doing well. Hasn't had to go to hospital.   She is worried about weight. Trying to stay focused on losing weight. Loses focus and falls off wagon. Feels like she needs some help with this - either someone or something in past. Thinks biggest issue is needing more movement. Not a snacker, drinks water, but feels like she should do more. In fall when she cut herself off from eating in evening, she noted huge difference.   Follows with Dr. Garwin Brothers regularly for gyn care. Just had mammogram. Had pap last year and this was normal.   Tremors still present. Notes more with activity - like holding mug.   Past Medical History:  Diagnosis Date   Anemia    Gestational diabetes    diet controlled   Postpartum care following vaginal delivery (7/27) 02/09/2016   Pregnancy induced hypertension    Trigeminal neuralgia    2012 corrected with surgery   Past Surgical History:  Procedure Laterality Date   APPENDECTOMY  2008   TRIGEMINAL NERVE DECOMPRESSION  2012   Allergies  Allergen Reactions   Amoxicillin Hives    Has patient had a PCN reaction causing immediate rash, facial/tongue/throat swelling, SOB or lightheadedness with hypotension: Yes Has patient had a PCN reaction causing severe rash involving mucus membranes or skin necrosis: No Has patient had a PCN reaction that required hospitalization No Has patient had a PCN reaction occurring within the last 10 years: No If all of the above answers are "NO", then may proceed with Cephalosporin use.    Current Meds  Medication Sig   Multiple Vitamin (MULTIVITAMIN ADULT PO) Take by mouth.   Social History   Tobacco Use   Smoking status: Never   Smokeless tobacco: Never  Substance  Use Topics   Alcohol use: No   Family History  Problem Relation Age of Onset   Cancer Other    Hypertension Other    Healthy Father    Cancer Paternal Grandfather        stomach, lung   Hypertension Mother    Breast cancer Mother 62   Hypertension Maternal Grandmother    Arthritis Maternal Grandmother    Hypertension Maternal Grandfather    Other Brother        ?prediabetic   Diabetes Paternal Grandmother    Hypertension Paternal Grandmother    Autism Son      Review of Systems  Constitutional:  Negative for activity change, appetite change, chills, fatigue, fever and unexpected weight change.  HENT:  Negative for congestion, ear pain, hearing loss, sinus pressure, sinus pain, sore throat and trouble swallowing.   Eyes:  Negative for pain and visual disturbance.  Respiratory:  Negative for cough, chest tightness, shortness of breath and wheezing.   Cardiovascular:  Negative for chest pain, palpitations and leg swelling.  Gastrointestinal:  Negative for abdominal pain, blood in stool, constipation, diarrhea, nausea and vomiting.  Genitourinary:  Negative for difficulty urinating and menstrual problem.  Musculoskeletal:  Negative for arthralgias and back pain.  Skin:  Negative for rash.  Neurological:  Negative for dizziness, weakness, numbness and headaches.  Hematological:  Negative for adenopathy. Does not bruise/bleed easily.  Psychiatric/Behavioral:  Negative for sleep disturbance and suicidal ideas. The patient is not nervous/anxious.  CBC:  Lab Results  Component Value Date   WBC 5.3 04/21/2020   HGB 11.4 (L) 04/21/2020   HCT 35.2 04/21/2020   MCH 30.3 04/21/2020   MCHC 32.4 04/21/2020   RDW 11.0 04/21/2020   PLT 306 04/21/2020   MPV 10.9 04/21/2020   CMP: Lab Results  Component Value Date   NA 137 04/21/2020   K 4.9 04/21/2020   CL 104 04/21/2020   CO2 29 04/21/2020   ANIONGAP 7 02/09/2016   GLUCOSE 92 04/21/2020   BUN 14 04/21/2020   CREATININE 0.76  04/21/2020   GFRAA >60 02/09/2016   CALCIUM 9.3 04/21/2020   PROT 7.9 04/21/2020   BILITOT 0.3 04/21/2020   ALKPHOS 83 02/09/2016   ALT 14 04/21/2020   AST 15 04/21/2020   LIPID: Lab Results  Component Value Date   CHOL 104 04/21/2020   TRIG 70 04/21/2020   HDL 37 (L) 04/21/2020   LDLCALC 52 04/21/2020    Objective:  BP 124/84 (BP Location: Right Arm, Patient Position: Sitting, Cuff Size: Large)    Pulse 90    Temp 98.4 F (36.9 C) (Oral)    Ht 5' 6.5" (1.689 m)    Wt 244 lb 12.8 oz (111 kg)    LMP 07/28/2021 (Exact Date)    SpO2 94%    BMI 38.92 kg/m   Weight: 244 lb 12.8 oz (111 kg)   BP Readings from Last 3 Encounters:  08/22/21 124/84  04/21/20 (!) 132/94  02/09/16 (!) 151/94   Wt Readings from Last 3 Encounters:  08/22/21 244 lb 12.8 oz (111 kg)  04/21/20 250 lb 6.4 oz (113.6 kg)  02/08/16 269 lb (122 kg)    Physical Exam Constitutional:      General: She is not in acute distress.    Appearance: She is well-developed.  HENT:     Head: Normocephalic and atraumatic.     Right Ear: External ear normal.     Left Ear: External ear normal.     Mouth/Throat:     Pharynx: No oropharyngeal exudate.  Eyes:     Conjunctiva/sclera: Conjunctivae normal.     Pupils: Pupils are equal, round, and reactive to light.  Neck:     Thyroid: No thyromegaly.  Cardiovascular:     Rate and Rhythm: Normal rate and regular rhythm.     Heart sounds: Normal heart sounds. No murmur heard.   No friction rub. No gallop.  Pulmonary:     Effort: Pulmonary effort is normal.     Breath sounds: Normal breath sounds.  Abdominal:     General: Bowel sounds are normal. There is no distension.     Palpations: Abdomen is soft. There is no mass.     Tenderness: There is no abdominal tenderness. There is no guarding.     Hernia: No hernia is present.  Musculoskeletal:        General: No tenderness or deformity. Normal range of motion.     Cervical back: Normal range of motion and neck supple.   Lymphadenopathy:     Cervical: No cervical adenopathy.  Skin:    General: Skin is warm and dry.     Findings: No rash.  Neurological:     Mental Status: She is alert and oriented to person, place, and time.     Deep Tendon Reflexes: Reflexes normal.     Reflex Scores:      Tricep reflexes are 2+ on the right side and 2+ on the left  side.      Bicep reflexes are 2+ on the right side and 2+ on the left side.      Brachioradialis reflexes are 2+ on the right side and 2+ on the left side.      Patellar reflexes are 2+ on the right side and 2+ on the left side. Psychiatric:        Speech: Speech normal.        Behavior: Behavior normal.        Thought Content: Thought content normal.    Assessment/Plan: Health Maintenance Due  Topic Date Due   URINE MICROALBUMIN  Never done   HEMOGLOBIN A1C  10/20/2020   COLONOSCOPY (Pts 45-59yr Insurance coverage will need to be confirmed)  Never done   Health Maintenance reviewed.   1. Preventative health care Keep working on regular exercise, healthy eating.   2. Vitamin D deficiency Checking bloodwork today.   3. Anemia, unspecified type Recheck bloodwork.  - CBC with Differential/Platelet; Future - CBC with Differential/Platelet  4. Screening for diabetes mellitus - Comprehensive metabolic panel; Future - Hemoglobin A1c; Future - Hemoglobin A1c - Comprehensive metabolic panel  5. Lipid screening - Lipid panel; Future - Lipid panel  6. Screening for colon cancer After discussion of colon cancer screening options; she has elected for cologuard.  - Cologuard  7. Class 2 obesity due to excess calories without serious comorbidity with body mass index (BMI) of 38.0 to 38.9 in adult We discussed medication treatment options to help with weight loss, specifically GLP1 wegovy and saxenda. She is going to consider and will make decision pending bloodwork.  - TSH; Future - TSH  8. History of gestational diabetes - Hemoglobin A1c;  Future - Hemoglobin A1c   Return for pending lab or imaging results. Encouraged her to conisder sleep eval. She had eval in past and was positive for sleep apnea. We discussed benefits of treating sleep apnea. She would like to work on losing weight and see if this helps with snoring. If not helping or if having difficulty withweight loss, then she will more strongly consider.  JMicheline Rough MD

## 2021-08-22 NOTE — Patient Instructions (Signed)
Look up wegovy and saxenda. Kara Medina is weekly injection, saxenda is daily.

## 2021-08-23 NOTE — Addendum Note (Signed)
Addended by: Agnes Lawrence on: 08/23/2021 04:29 PM   Modules accepted: Orders

## 2021-08-27 ENCOUNTER — Other Ambulatory Visit: Payer: Self-pay | Admitting: Family Medicine

## 2021-08-27 DIAGNOSIS — R779 Abnormality of plasma protein, unspecified: Secondary | ICD-10-CM

## 2021-08-27 DIAGNOSIS — D649 Anemia, unspecified: Secondary | ICD-10-CM

## 2021-09-08 LAB — COLOGUARD: COLOGUARD: NEGATIVE

## 2021-10-29 ENCOUNTER — Other Ambulatory Visit (INDEPENDENT_AMBULATORY_CARE_PROVIDER_SITE_OTHER): Payer: 59

## 2021-10-29 DIAGNOSIS — D649 Anemia, unspecified: Secondary | ICD-10-CM

## 2021-10-29 DIAGNOSIS — R779 Abnormality of plasma protein, unspecified: Secondary | ICD-10-CM

## 2021-10-29 LAB — CBC WITH DIFFERENTIAL/PLATELET
Basophils Absolute: 0 10*3/uL (ref 0.0–0.1)
Basophils Relative: 0.7 % (ref 0.0–3.0)
Eosinophils Absolute: 0.1 10*3/uL (ref 0.0–0.7)
Eosinophils Relative: 2 % (ref 0.0–5.0)
HCT: 33.9 % — ABNORMAL LOW (ref 36.0–46.0)
Hemoglobin: 11.1 g/dL — ABNORMAL LOW (ref 12.0–15.0)
Lymphocytes Relative: 37.5 % (ref 12.0–46.0)
Lymphs Abs: 2.1 10*3/uL (ref 0.7–4.0)
MCHC: 32.7 g/dL (ref 30.0–36.0)
MCV: 91.6 fl (ref 78.0–100.0)
Monocytes Absolute: 0.6 10*3/uL (ref 0.1–1.0)
Monocytes Relative: 9.9 % (ref 3.0–12.0)
Neutro Abs: 2.8 10*3/uL (ref 1.4–7.7)
Neutrophils Relative %: 49.9 % (ref 43.0–77.0)
Platelets: 380 10*3/uL (ref 150.0–400.0)
RBC: 3.7 Mil/uL — ABNORMAL LOW (ref 3.87–5.11)
RDW: 12.8 % (ref 11.5–15.5)
WBC: 5.6 10*3/uL (ref 4.0–10.5)

## 2021-10-29 LAB — COMPREHENSIVE METABOLIC PANEL
ALT: 18 U/L (ref 0–35)
AST: 16 U/L (ref 0–37)
Albumin: 3.7 g/dL (ref 3.5–5.2)
Alkaline Phosphatase: 57 U/L (ref 39–117)
BUN: 15 mg/dL (ref 6–23)
CO2: 29 mEq/L (ref 19–32)
Calcium: 9 mg/dL (ref 8.4–10.5)
Chloride: 103 mEq/L (ref 96–112)
Creatinine, Ser: 0.83 mg/dL (ref 0.40–1.20)
GFR: 84.79 mL/min (ref >60.00)
Glucose, Bld: 96 mg/dL (ref 70–99)
Potassium: 4.1 mEq/L (ref 3.5–5.1)
Sodium: 136 mEq/L (ref 135–145)
Total Bilirubin: 0.4 mg/dL (ref 0.2–1.2)
Total Protein: 8.9 g/dL — ABNORMAL HIGH (ref 6.0–8.3)

## 2021-10-29 LAB — IBC + FERRITIN
Ferritin: 149.7 ng/mL (ref 10.0–291.0)
Iron: 85 ug/dL (ref 42–145)
Saturation Ratios: 29.6 % (ref 20.0–50.0)
TIBC: 287 ug/dL (ref 250.0–450.0)
Transferrin: 205 mg/dL — ABNORMAL LOW (ref 212.0–360.0)

## 2021-10-29 LAB — FOLATE: Folate: 14.2 ng/mL (ref >5.9)

## 2021-10-29 LAB — VITAMIN B12: Vitamin B-12: 435 pg/mL (ref 211–911)

## 2022-05-10 ENCOUNTER — Ambulatory Visit (INDEPENDENT_AMBULATORY_CARE_PROVIDER_SITE_OTHER): Payer: 59

## 2022-05-10 ENCOUNTER — Telehealth: Payer: Self-pay | Admitting: Family Medicine

## 2022-05-10 DIAGNOSIS — Z23 Encounter for immunization: Secondary | ICD-10-CM | POA: Diagnosis not present

## 2022-05-10 NOTE — Telephone Encounter (Signed)
Pt would like to transfer her care to Dr. Volanda Napoleon since her daughter and her sister Devonne Doughty are both patients of Dr. Volanda Napoleon.   Please advise.

## 2022-05-10 NOTE — Telephone Encounter (Signed)
Ok

## 2022-05-24 ENCOUNTER — Ambulatory Visit (INDEPENDENT_AMBULATORY_CARE_PROVIDER_SITE_OTHER): Payer: 59 | Admitting: Family Medicine

## 2022-05-24 VITALS — BP 150/110 | HR 112 | Temp 99.3°F | Ht 66.5 in | Wt 243.4 lb

## 2022-05-24 DIAGNOSIS — R030 Elevated blood-pressure reading, without diagnosis of hypertension: Secondary | ICD-10-CM

## 2022-05-24 DIAGNOSIS — N393 Stress incontinence (female) (male): Secondary | ICD-10-CM

## 2022-05-24 DIAGNOSIS — I160 Hypertensive urgency: Secondary | ICD-10-CM | POA: Diagnosis not present

## 2022-05-24 DIAGNOSIS — E6609 Other obesity due to excess calories: Secondary | ICD-10-CM

## 2022-05-24 DIAGNOSIS — Z6838 Body mass index (BMI) 38.0-38.9, adult: Secondary | ICD-10-CM | POA: Diagnosis not present

## 2022-05-24 MED ORDER — TOLTERODINE TARTRATE 1 MG PO TABS: 1.0000 mg | ORAL_TABLET | Freq: Two times a day (BID) | ORAL | 2 refills | Status: DC

## 2022-05-24 NOTE — Progress Notes (Signed)
Subjective:    Patient ID: Kara Medina, female    DOB: 09/27/1975, 46 y.o.   MRN: [id number]  Chief Complaint  Patient presents with   Establish Care    Wants to discuss wt, bg concern. Tired of being over wt. Fam hx of DM BP and strokes.     HPI Patient was seen today for TOC, previously seen by Dr. Ethlyn Gallery.  Wt concerns: -seen at Bariatric clinic -tried juices, teas, phentermine, vit B12 -lost 30 lbs, then hit plateau -was walking for exercise.  Weak Bladder: -leakage if coughs, laughs, or bends over -wearing bladder control pad x 2-3 yrs -has never been worked up. -pt has 4 children including a set of twins.  HTN: -not currently on meds -h/o pregnancy induced HTN   Past Medical History:  Diagnosis Date   Anemia    Gestational diabetes    diet controlled   Postpartum care following vaginal delivery (7/27) 02/09/2016   Pregnancy induced hypertension    Trigeminal neuralgia    2012 corrected with surgery    Allergies  Allergen Reactions   Amoxicillin Hives    Has patient had a PCN reaction causing immediate rash, facial/tongue/throat swelling, SOB or lightheadedness with hypotension: Yes Has patient had a PCN reaction causing severe rash involving mucus membranes or skin necrosis: No Has patient had a PCN reaction that required hospitalization No Has patient had a PCN reaction occurring within the last 10 years: No If all of the above answers are "NO", then may proceed with Cephalosporin use.     ROS General: Denies fever, chills, night sweats, changes in weight, changes in appetite +changes in weight HEENT: Denies headaches, ear pain, changes in vision, rhinorrhea, sore throat CV: Denies CP, palpitations, SOB, orthopnea Pulm: Denies SOB, cough, wheezing GI: Denies abdominal pain, nausea, vomiting, diarrhea, constipation GU: Denies dysuria, hematuria, frequency, vaginal discharge +bladder leakage Msk: Denies muscle cramps, joint pains Neuro: Denies  weakness, numbness, tingling Skin: Denies rashes, bruising Psych: Denies depression, anxiety, hallucinations      Objective:    Blood pressure (!) 150/110, pulse (!) 112, temperature 99.3 F (37.4 C), temperature source Oral, height 5' 6.5" (1.689 m), weight 243 lb 6.4 oz (110.4 kg), SpO2 98 %.  Gen. Pleasant, well-nourished, in no distress, normal affect   HEENT: [place]/AT, face symmetric, conjunctiva clear, no scleral icterus, PERRLA, EOMI, nares patent without drainage Neck: No JVD, no thyromegaly, no carotid bruits Lungs: no accessory muscle use, CTAB, no wheezes or rales Cardiovascular: tachycardia, no m/r/g, no peripheral edema Musculoskeletal: No deformities, no cyanosis or clubbing, normal tone Neuro:  A&Ox3, CN II-XII intact, normal gait Skin:  Warm, no lesions/ rash   Wt Readings from Last 3 Encounters:  05/24/22 243 lb 6.4 oz (110.4 kg)  08/22/21 244 lb 12.8 oz (111 kg)  04/21/20 250 lb 6.4 oz (113.6 kg)    Lab Results  Component Value Date   WBC 5.6 10/29/2021   HGB 11.1 (L) 10/29/2021   HCT 33.9 (L) 10/29/2021   PLT 380.0 10/29/2021   GLUCOSE 96 10/29/2021   CHOL 105 08/22/2021   TRIG 42.0 08/22/2021   HDL 36.50 (L) 08/22/2021   LDLCALC 60 08/22/2021   ALT 18 10/29/2021   AST 16 10/29/2021   NA 136 10/29/2021   K 4.1 10/29/2021   CL 103 10/29/2021   CREATININE 0.83 10/29/2021   BUN 15 10/29/2021   CO2 29 10/29/2021   TSH 0.80 08/22/2021   HGBA1C 4.9 08/22/2021    Assessment/Plan:  Stress incontinence -discussed limiting caffeine intake, bladder training, etc -discussed pelvic floor PT.  Pt open to this -discussed r/b/a of medications for incontinence.   -will start detrol -consider f/u with Urology for continued or worsened sx  - Plan: tolterodine (DETROL) 1 MG tablet, Ambulatory referral to Physical Therapy  Hypertensive Urgency -asymptomatic.  BP 150/120.  Controlled at prior visits, 124/84 on 08/22/21. -Recheck bp -h/o pregnancy induced  HTN -not currently on meds -Lifestyle modifications advised -Patient to obtain BP cuff to monitor blood pressure at home.  For elevations consistently greater than 140/90 start medication. -Will have patient follow-up in 2-4 weeks, sooner if needed for BP recheck. -given strict precautions  Class 2 obesity due to excess calories without serious comorbidity with body mass index (BMI) of 38.0 to 38.9 in adult -Body mass index is 38.7 kg/m. -lifestyle modifications -consider wt management referral.   F/u 2-4 wks  Grier Mitts, MD

## 2022-05-24 NOTE — Patient Instructions (Signed)
You should consider obtaining a blood pressure cuff from local drugstore, online, or big box store such as [employer], [employer], etc so that you can check your blood pressure at home.  Your blood pressure should be less than 140/90 consistently.

## 2022-06-11 ENCOUNTER — Encounter: Payer: Self-pay | Admitting: Family Medicine

## 2022-06-21 ENCOUNTER — Ambulatory Visit: Payer: 59 | Admitting: Family Medicine

## 2022-08-23 ENCOUNTER — Ambulatory Visit (INDEPENDENT_AMBULATORY_CARE_PROVIDER_SITE_OTHER): Payer: 59 | Admitting: Family Medicine

## 2022-08-23 VITALS — BP 162/100 | HR 111 | Temp 98.9°F | Ht 66.5 in | Wt 237.4 lb

## 2022-08-23 DIAGNOSIS — R002 Palpitations: Secondary | ICD-10-CM | POA: Diagnosis not present

## 2022-08-23 DIAGNOSIS — R000 Tachycardia, unspecified: Secondary | ICD-10-CM | POA: Diagnosis not present

## 2022-08-23 DIAGNOSIS — R9431 Abnormal electrocardiogram [ECG] [EKG]: Secondary | ICD-10-CM

## 2022-08-23 DIAGNOSIS — R040 Epistaxis: Secondary | ICD-10-CM | POA: Diagnosis not present

## 2022-08-23 DIAGNOSIS — I160 Hypertensive urgency: Secondary | ICD-10-CM

## 2022-08-23 DIAGNOSIS — N939 Abnormal uterine and vaginal bleeding, unspecified: Secondary | ICD-10-CM

## 2022-08-23 MED ORDER — METOPROLOL TARTRATE 25 MG PO TABS: 25.0000 mg | ORAL_TABLET | Freq: Two times a day (BID) | ORAL | 3 refills | Status: DC

## 2022-08-23 MED ORDER — BLOOD PRESSURE CUFF MISC: 1.0000 | Freq: Every day | 0 refills | Status: AC

## 2022-08-23 NOTE — Progress Notes (Unsigned)
Established Patient Office Visit   Subjective  Patient ID: Kara Medina, female    DOB: 01/12/1976  Age: 47 y.o. MRN: [id number]  Chief Complaint  Patient presents with  . Epistaxis    Pt reports continues nosebleed that started 3 wks ago. Last episode of nosebleed was 2 days ago.  . Palpitations    Pt c/o of feeling fast heart beat started 3 wks ago. Sx is current today.  . Menstrual Problem    Pt c/o of ongoing menstrual cycle that started 2 wks ago. Sx is current today.     Patient is a 47 year old female last seen on 05/24/2022 and advised to follow-up in 2-4 weeks for HTN.  Patient presents today with epistaxis daily x 3 weeks.  Last episode was 2 days ago.  Pt stated noticing fast heartbeat 3 wks ago.  Notices more with exertion such as picking up items or doing the laundry.  Denies CP.  Not checking bp.  Eating out more, but increased intake of vegetables.  Menses started late January and has continued x 2 wks plus.  Not on bp meds or anticoagulation therapy.  Epistaxis   Palpitations    {History (Optional):23778}  Review of Systems  HENT:  Positive for nosebleeds.   Cardiovascular:  Positive for palpitations.   Negative unless stated above    Objective:     BP (!) 162/100 (BP Location: Right Arm, Cuff Size: Large)   Pulse (!) 111   Temp 98.9 F (37.2 C) (Oral)   Ht 5' 6.5" (1.689 m)   Wt 237 lb 6.4 oz (107.7 kg)   LMP 08/09/2022 (Exact Date)   SpO2 99%   BMI 37.74 kg/m  {Vitals History (Optional):23777}  Physical Exam Constitutional:      General: She is not in acute distress.    Appearance: Normal appearance.  HENT:     Head: Normocephalic and atraumatic.     Nose: Nose normal.     Mouth/Throat:     Mouth: Mucous membranes are moist.  Eyes:     Extraocular Movements: Extraocular movements intact.     Conjunctiva/sclera: Conjunctivae normal.     Pupils: Pupils are equal, round, and reactive to light.  Cardiovascular:     Rate and Rhythm: Normal  rate and regular rhythm.     Heart sounds: Normal heart sounds. No murmur heard.    No gallop.  Pulmonary:     Effort: Pulmonary effort is normal. No respiratory distress.     Breath sounds: Normal breath sounds. No wheezing, rhonchi or rales.  Skin:    General: Skin is warm and dry.  Neurological:     Mental Status: She is alert and oriented to person, place, and time.     No results found for any visits on 08/23/22.    Assessment & Plan:  Tachycardia -     EKG 12-Lead -     Comprehensive metabolic panel -     TSH -     T4, free -     Metoprolol Tartrate; Take 1 tablet (25 mg total) by mouth 2 (two) times daily.  Dispense: 60 tablet; Refill: 3  Hypertensive urgency -     CBC with Differential/Platelet -     Iron, TIBC and Ferritin Panel -     Comprehensive metabolic panel -     TSH -     T4, free -     Metoprolol Tartrate; Take 1 tablet (25 mg total) by mouth 2 (  two) times daily.  Dispense: 60 tablet; Refill: 3  Epistaxis -     CBC with Differential/Platelet -     Iron, TIBC and Ferritin Panel  Palpitations  Abnormal uterine bleeding (AUB)  Pt with hypertensive urgency likely causing epistaxis.  Increased bleeding likely causing palpitations/tachycardia.  Obtain labs to further evaluate.  EKG this visit: NSR, HR mid to upper 90s, LVH by criteria-  Return in about 1 month (around 09/21/2022).   Billie Ruddy, MD

## 2022-08-24 LAB — COMPREHENSIVE METABOLIC PANEL
AG Ratio: 0.2 (calc) — ABNORMAL LOW (ref 1.0–2.5)
ALT: 15 U/L (ref 6–29)
AST: 20 U/L (ref 10–35)
Albumin: 2.4 g/dL — ABNORMAL LOW (ref 3.6–5.1)
Alkaline phosphatase (APISO): 27 U/L — ABNORMAL LOW (ref 31–125)
BUN: 17 mg/dL (ref 7–25)
CO2: 24 mmol/L (ref 20–32)
Calcium: 8 mg/dL — ABNORMAL LOW (ref 8.6–10.2)
Chloride: 99 mmol/L (ref 98–110)
Creat: 0.95 mg/dL (ref 0.50–0.99)
Globulin: 12 g/dL (calc) — ABNORMAL HIGH (ref 1.9–3.7)
Glucose, Bld: 64 mg/dL — ABNORMAL LOW (ref 65–99)
Potassium: 4.2 mmol/L (ref 3.5–5.3)
Sodium: 124 mmol/L — ABNORMAL LOW (ref 135–146)
Total Bilirubin: 0.2 mg/dL (ref 0.2–1.2)
Total Protein: 14.4 g/dL — ABNORMAL HIGH (ref 6.1–8.1)

## 2022-08-24 LAB — CBC WITH DIFFERENTIAL/PLATELET
Absolute Monocytes: 984 cells/uL — ABNORMAL HIGH (ref 200–950)
Basophils Absolute: 32 cells/uL (ref 0–200)
Basophils Relative: 0.4 %
Eosinophils Absolute: 168 cells/uL (ref 15–500)
Eosinophils Relative: 2.1 %
HCT: 22.7 % — ABNORMAL LOW (ref 35.0–45.0)
Hemoglobin: 7.5 g/dL — ABNORMAL LOW (ref 11.7–15.5)
Lymphs Abs: 3104 cells/uL (ref 850–3900)
MCH: 30.6 pg (ref 27.0–33.0)
MCHC: 33 g/dL (ref 32.0–36.0)
MCV: 92.7 fL (ref 80.0–100.0)
MPV: 10.3 fL (ref 7.5–12.5)
Monocytes Relative: 12.3 %
Neutro Abs: 3712 cells/uL (ref 1500–7800)
Neutrophils Relative %: 46.4 %
Platelets: 266 10*3/uL (ref 140–400)
RBC: 2.45 10*6/uL — ABNORMAL LOW (ref 3.80–5.10)
RDW: 13 % (ref 11.0–15.0)
Total Lymphocyte: 38.8 %
WBC: 8 10*3/uL (ref 3.8–10.8)

## 2022-08-24 LAB — IRON,TIBC AND FERRITIN PANEL
%SAT: 28 % (calc) (ref 16–45)
Ferritin: 79 ng/mL (ref 16–232)
Iron: 59 ug/dL (ref 40–190)
TIBC: 214 mcg/dL (calc) — ABNORMAL LOW (ref 250–450)

## 2022-08-24 LAB — SPECIMEN COMPROMISED

## 2022-08-24 LAB — TSH: TSH: 1.36 mIU/L

## 2022-08-24 LAB — T4, FREE: Free T4: 0.8 ng/dL (ref 0.8–1.8)

## 2022-08-27 ENCOUNTER — Encounter: Payer: Self-pay | Admitting: Family Medicine

## 2022-08-27 ENCOUNTER — Other Ambulatory Visit: Payer: Self-pay | Admitting: Family Medicine

## 2022-08-27 DIAGNOSIS — R040 Epistaxis: Secondary | ICD-10-CM

## 2022-08-27 DIAGNOSIS — R899 Unspecified abnormal finding in specimens from other organs, systems and tissues: Secondary | ICD-10-CM

## 2022-08-27 DIAGNOSIS — I160 Hypertensive urgency: Secondary | ICD-10-CM

## 2022-08-28 ENCOUNTER — Encounter: Payer: Self-pay | Admitting: Family Medicine

## 2022-08-29 ENCOUNTER — Ambulatory Visit: Payer: 59 | Attending: Internal Medicine | Admitting: Internal Medicine

## 2022-08-29 ENCOUNTER — Telehealth: Payer: Self-pay

## 2022-08-29 ENCOUNTER — Encounter: Payer: Self-pay | Admitting: Internal Medicine

## 2022-08-29 ENCOUNTER — Telehealth (INDEPENDENT_AMBULATORY_CARE_PROVIDER_SITE_OTHER): Payer: 59 | Admitting: Family Medicine

## 2022-08-29 ENCOUNTER — Other Ambulatory Visit (INDEPENDENT_AMBULATORY_CARE_PROVIDER_SITE_OTHER): Payer: 59

## 2022-08-29 VITALS — BP 183/107 | HR 100 | Ht 66.0 in | Wt 236.8 lb

## 2022-08-29 DIAGNOSIS — R899 Unspecified abnormal finding in specimens from other organs, systems and tissues: Secondary | ICD-10-CM | POA: Diagnosis not present

## 2022-08-29 DIAGNOSIS — R040 Epistaxis: Secondary | ICD-10-CM

## 2022-08-29 DIAGNOSIS — E871 Hypo-osmolality and hyponatremia: Secondary | ICD-10-CM | POA: Diagnosis not present

## 2022-08-29 DIAGNOSIS — I160 Hypertensive urgency: Secondary | ICD-10-CM

## 2022-08-29 DIAGNOSIS — D649 Anemia, unspecified: Secondary | ICD-10-CM

## 2022-08-29 DIAGNOSIS — D509 Iron deficiency anemia, unspecified: Secondary | ICD-10-CM | POA: Diagnosis not present

## 2022-08-29 DIAGNOSIS — I517 Cardiomegaly: Secondary | ICD-10-CM

## 2022-08-29 LAB — COMPREHENSIVE METABOLIC PANEL
ALT: 15 U/L (ref 0–35)
AST: 20 U/L (ref 0–37)
Albumin: 2.3 g/dL — ABNORMAL LOW (ref 3.5–5.2)
Alkaline Phosphatase: 30 U/L — ABNORMAL LOW (ref 39–117)
BUN: 15 mg/dL (ref 6–23)
CO2: 28 mEq/L (ref 19–32)
Calcium: 8.4 mg/dL (ref 8.4–10.5)
Chloride: 101 mEq/L (ref 96–112)
Creatinine, Ser: 0.95 mg/dL (ref 0.40–1.20)
GFR: 71.69 mL/min (ref >60.00)
Glucose, Bld: 81 mg/dL (ref 70–99)
Potassium: 3.9 mEq/L (ref 3.5–5.1)
Sodium: 126 mEq/L — ABNORMAL LOW (ref 135–145)
Total Bilirubin: 0.2 mg/dL (ref 0.2–1.2)
Total Protein: 14.4 g/dL — ABNORMAL HIGH (ref 6.0–8.3)

## 2022-08-29 LAB — CBC WITH DIFFERENTIAL/PLATELET
Basophils Absolute: 0 10*3/uL (ref 0.0–0.1)
Basophils Relative: 0.4 % (ref 0.0–3.0)
Eosinophils Absolute: 0.1 10*3/uL (ref 0.0–0.7)
Eosinophils Relative: 2.3 % (ref 0.0–5.0)
HCT: 22.5 % — CL (ref 36.0–46.0)
Hemoglobin: 7.6 g/dL — CL (ref 12.0–15.0)
Lymphocytes Relative: 39 % (ref 12.0–46.0)
Lymphs Abs: 2.4 10*3/uL (ref 0.7–4.0)
MCHC: 33.6 g/dL (ref 30.0–36.0)
MCV: 95 fl (ref 78.0–100.0)
Monocytes Absolute: 0.7 10*3/uL (ref 0.1–1.0)
Monocytes Relative: 11.3 % (ref 3.0–12.0)
Neutro Abs: 2.9 10*3/uL (ref 1.4–7.7)
Neutrophils Relative %: 47 % (ref 43.0–77.0)
Platelets: 310 10*3/uL (ref 150.0–400.0)
RBC: 2.37 Mil/uL — ABNORMAL LOW (ref 3.87–5.11)
RDW: 14.8 % (ref 11.5–15.5)
WBC: 6.2 10*3/uL (ref 4.0–10.5)

## 2022-08-29 LAB — T4, FREE: Free T4: 0.87 ng/dL (ref 0.60–1.60)

## 2022-08-29 LAB — TSH: TSH: 1.35 u[IU]/mL (ref 0.35–5.50)

## 2022-08-29 MED ORDER — AMLODIPINE BESYLATE 5 MG PO TABS: 5.0000 mg | ORAL_TABLET | Freq: Every day | ORAL | 3 refills | Status: DC

## 2022-08-29 NOTE — Progress Notes (Signed)
Cardiology Office Note:    Date:  08/29/2022   ID:  EDDYE WIESENFELD, DOB 09-05-1975, MRN [id number]  PCP:  Billie Ruddy, MD   [place] Providers Cardiologist:  Werner Lean, MD     Referring MD: Billie Ruddy, MD   CC: HTN Consulted for the evaluation of HTN urgency at the behest of Dr. Volanda Napoleon  History of Present Illness:    Kara Medina is a 47 y.o. female with a hx of gestation HTN and gestational DM.  Patient notes that she is feeling bad.   With even light activity she has palpitations. No syncope. Feels fatigue. She cannot do any activity without a racing heart. She had long standing high BP in the past.  She has just started metoprolol for BP with her PCP. Sx since at least December.  Started back walking two days ago.  Has had no chest pain, chest pressure, chest tightness, chest stinging .    No shortness of breath, DOE .  No PND or orthopnea.  No weight gain, leg swelling , or abdominal swelling.   Patient reports prior cardiac testing including EKG No visual changes.  Rare headaches.  Heavy menstrual bleeding.  Past Medical History:  Diagnosis Date   Anemia    Gestational diabetes    diet controlled   Postpartum care following vaginal delivery (7/27) 02/09/2016   Pregnancy induced hypertension    Trigeminal neuralgia    2012 corrected with surgery    Past Surgical History:  Procedure Laterality Date   APPENDECTOMY  2008   TRIGEMINAL NERVE DECOMPRESSION  2012    Current Medications: Current Meds  Medication Sig   amLODipine (NORVASC) 5 MG tablet Take 1 tablet (5 mg total) by mouth daily.   Blood Pressure Monitoring (BLOOD PRESSURE CUFF) MISC 1 Device by Does not apply route daily.   ibuprofen (ADVIL) 200 MG tablet Take 400 mg by mouth every 6 (six) hours as needed for headache or moderate pain.   metoprolol tartrate (LOPRESSOR) 25 MG tablet Take 1 tablet (25 mg total) by mouth 2 (two) times daily.   Multiple Vitamin  (MULTIVITAMIN ADULT PO) Take by mouth.     Allergies:   Amoxicillin   Social History   Socioeconomic History   Marital status: Married    Spouse name: Not on file   Number of children: Not on file   Years of education: Not on file   Highest education level: Bachelor's degree (e.g., BA, AB, BS)  Occupational History   Not on file  Tobacco Use   Smoking status: Never   Smokeless tobacco: Never  Vaping Use   Vaping Use: Never used  Substance and Sexual Activity   Alcohol use: No   Drug use: No   Sexual activity: Yes  Other Topics Concern   Not on file  Social History Narrative   Not on file   Social Determinants of Health   Financial Resource Strain: Low Risk  (08/23/2022)   Overall Financial Resource Strain (CARDIA)    Difficulty of Paying Living Expenses: Not hard at all  Food Insecurity: No Food Insecurity (08/23/2022)   Hunger Vital Sign    Worried About Running Out of Food in the Last Year: Never true    Ran Out of Food in the Last Year: Never true  Transportation Needs: No Transportation Needs (08/23/2022)   PRAPARE - Hydrologist (Medical): No    Lack of Transportation (Non-Medical): No  Physical Activity: Insufficiently Active (08/23/2022)   Exercise Vital Sign    Days of Exercise per Week: 2 days    Minutes of Exercise per Session: 30 min  Stress: Stress Concern Present (08/23/2022)   [place]    Feeling of Stress : To some extent  Social Connections: Unknown (08/23/2022)   Social Connection and Isolation Panel [NHANES]    Frequency of Communication with Friends and Family: More than three times a week    Frequency of Social Gatherings with Friends and Family: Once a week    Attends Religious Services: Patient refused    Marine scientist or Organizations: Yes    Attends Music therapist: More than 4 times per year    Marital Status: Married     Family  History: The patient's family history includes Arthritis in her maternal grandmother; Autism in her son; Breast cancer (age of onset: 62) in her mother; Cancer in her paternal grandfather and another family member; Diabetes in her paternal grandmother; Healthy in her father; Hypertension in her maternal grandfather, maternal grandmother, mother, paternal grandmother, and another family member; Other in her brother.  ROS:   Please see the history of present illness.     All other systems reviewed and are negative.  EKGs/Labs/Other Studies Reviewed:    The following studies were reviewed today:  EKG:  EKG is  ordered today.  The ekg ordered today demonstrates  08/23/2022: SR 95 with LVH with secondary repolarization    Recent Labs: 08/29/2022: ALT 15; BUN 15; Creatinine, Ser 0.95; Hemoglobin 7.6 Repeated and verified X2.; Platelets 310.0; Potassium 3.9; Sodium 126; TSH 1.35  Recent Lipid Panel    Component Value Date/Time   CHOL 105 08/22/2021 1048   TRIG 42.0 08/22/2021 1048   HDL 36.50 (L) 08/22/2021 1048   CHOLHDL 3 08/22/2021 1048   VLDL 8.4 08/22/2021 1048   LDLCALC 60 08/22/2021 1048   LDLCALC 52 04/21/2020 1543    Risk Assessment/Calculations:    HYPERTENSION CONTROL Vitals:   08/29/22 1534 08/29/22 1546  BP: (!) 172/90 (!) 183/107    The patient's blood pressure is elevated above target today.  In order to address the patient's elevated BP: A new medication was prescribed today.        Physical Exam:    VS:  BP (!) 183/107 Comment: left arm - dynamat  Pulse 100   Ht 5' 6"$  (1.676 m)   Wt 236 lb 12.8 oz (107.4 kg)   LMP 08/09/2022 (Exact Date)   SpO2 98%   BMI 38.22 kg/m     Wt Readings from Last 3 Encounters:  08/29/22 236 lb 12.8 oz (107.4 kg)  08/23/22 237 lb 6.4 oz (107.7 kg)  05/24/22 243 lb 6.4 oz (110.4 kg)    Gen: no distress, morbid obesity   Neck: No JVD Cardiac: No Rubs or Gallops, no murmur, RRR +2 radial pulses Respiratory: Clear to  auscultation bilaterally, normal effort, normal  respiratory rate GI: Soft, nontender, non-distended  MS: No  edema;  moves all extremities Integument: Skin feels warm Neuro:  At time of evaluation, alert and oriented to person/place/time/situation  Psych: Normal affect, patient feels   ASSESSMENT:    1. Hypertensive urgency   2. Iron deficiency anemia, unspecified iron deficiency anemia type   3. LVH (left ventricular hypertrophy)     PLAN:    Hypertensive Urgency Hx of gestation HTN  Hx of gestational DM - I  predict she will end up needing 2.8 medications for control - adding norvasc 5 mg - she will call with AMB BP in two weeks; will change in labetalol - Pharm D visit  Euvolemic hyponatremia - managed principally by Grier Mitts MD (see prior documentation)  - avoid diuretic HTN therapy, at least until this resolves  Normocytic anemia Due to blood loss - managed principally by Grier Mitts MD (see prior documentation)  - offered anemia panel, pt will wait to f/u with PCP - palpitations and heart racing are likely compensatory from this  3-4 months me or APP     Medication Adjustments/Labs and Tests Ordered: Current medicines are reviewed at length with the patient today.  Concerns regarding medicines are outlined above.  Orders Placed This Encounter  Procedures   AMB Referral to [facility]   ECHOCARDIOGRAM COMPLETE   Meds ordered this encounter  Medications   amLODipine (NORVASC) 5 MG tablet    Sig: Take 1 tablet (5 mg total) by mouth daily.    Dispense:  90 tablet    Refill:  3    Patient Instructions  Medication Instructions:  Your physician has recommended you make the following change in your medication:  START: amlodipine (Norvasc) 5 mg by mouth once daily Please monitor your BP and send in readings every 2 weeks   *If you need a refill on your cardiac medications before your next appointment, please call your pharmacy*   Lab  Work: NONE If you have labs (blood work) drawn today and your tests are completely normal, you will receive your results only by: [place] (if you have MyChart) OR A paper copy in the mail If you have any lab test that is abnormal or we need to change your treatment, we will call you to review the results.   Testing/Procedures: Your physician has requested that you have an echocardiogram. Echocardiography is a painless test that uses sound waves to create images of your heart. It provides your doctor with information about the size and shape of your heart and how well your heart's chambers and valves are working. This procedure takes approximately one hour. There are no restrictions for this procedure. Please do NOT wear cologne, perfume, aftershave, or lotions (deodorant is allowed). Please arrive 15 minutes prior to your appointment time.  Your physician has requested that you see the Pharmacy Clinic for Hypertension.   Follow-Up: At [facility], you and your health needs are our priority.  As part of our continuing mission to provide you with exceptional heart care, we have created designated Provider Care Teams.  These Care Teams include your primary Cardiologist (physician) and Advanced Practice Providers (APPs -  Physician Assistants and Nurse Practitioners) who all work together to provide you with the care you need, when you need it.   Your next appointment:   3 month(s)  Provider:   Werner Lean, MD  or Nicholes Rough, PA-C, Ambrose Pancoast, NP, or Christen Bame, NP         Signed, Werner Lean, MD  08/29/2022 [place]

## 2022-08-29 NOTE — Telephone Encounter (Signed)
CRITICAL VALUE STICKER  CRITICAL VALUE: Hemoglobin of 7.6 Hematocrit of 22.5  RECEIVER (on-site recipient of call):  DATE & TIME NOTIFIED: 08/29/22   MESSENGER (representative from lab): Berna Bue from [facility]  MD NOTIFIED:  Dr. Volanda Napoleon  TIME OF NOTIFICATION:  RESPONSE:

## 2022-08-29 NOTE — Patient Instructions (Signed)
Medication Instructions:  Your physician has recommended you make the following change in your medication:  START: amlodipine (Norvasc) 5 mg by mouth once daily Please monitor your BP and send in readings every 2 weeks   *If you need a refill on your cardiac medications before your next appointment, please call your pharmacy*   Lab Work: NONE If you have labs (blood work) drawn today and your tests are completely normal, you will receive your results only by: [place] (if you have MyChart) OR A paper copy in the mail If you have any lab test that is abnormal or we need to change your treatment, we will call you to review the results.   Testing/Procedures: Your physician has requested that you have an echocardiogram. Echocardiography is a painless test that uses sound waves to create images of your heart. It provides your doctor with information about the size and shape of your heart and how well your heart's chambers and valves are working. This procedure takes approximately one hour. There are no restrictions for this procedure. Please do NOT wear cologne, perfume, aftershave, or lotions (deodorant is allowed). Please arrive 15 minutes prior to your appointment time.  Your physician has requested that you see the Pharmacy Clinic for Hypertension.   Follow-Up: At [facility], you and your health needs are our priority.  As part of our continuing mission to provide you with exceptional heart care, we have created designated Provider Care Teams.  These Care Teams include your primary Cardiologist (physician) and Advanced Practice Providers (APPs -  Physician Assistants and Nurse Practitioners) who all work together to provide you with the care you need, when you need it.   Your next appointment:   3 month(s)  Provider:   Werner Lean, MD  or Nicholes Rough, PA-C, Ambrose Pancoast, NP, or Christen Bame, NP

## 2022-08-29 NOTE — Telephone Encounter (Signed)
Pt called regarding repeat critical lab results: Hgb 7.6, Hct 22.5.  Hyponatremia again noted and elevated total protein.  Given symptomatic anemia patient advised to proceed to nearest ED.  Patient states she was made aware of some of the results at her cardiology visit today.  Pt confused regarding sense of urgency as it was made to seem like she could take iron pill or get an iron infusion.  Pt advised per chart review mild anemia, Hgb 10.1-11.6 since 2017 and total protein elevation since 2023.   Pt been advised to proceed to local ED.  Declines at this time.   On day of service, 17 minutes spent reviewing the chart, counseling and/or coordinating care for plan and treatment of diagnosis below.    Grier Mitts, MD

## 2022-08-30 LAB — IRON,TIBC AND FERRITIN PANEL
%SAT: 29 % (calc) (ref 16–45)
Ferritin: 77 ng/mL (ref 16–232)
Iron: 65 ug/dL (ref 40–190)
TIBC: 225 mcg/dL (calc) — ABNORMAL LOW (ref 250–450)

## 2022-09-02 ENCOUNTER — Telehealth: Payer: Self-pay | Admitting: Hematology and Oncology

## 2022-09-02 NOTE — Telephone Encounter (Signed)
Scheduled appt per 2/16 referral. Pt is aware of appt date and time. Pt is aware to arrive 15 mins prior to appt time and to bring and updated insurance card. Pt is aware of appt location.   °

## 2022-09-04 ENCOUNTER — Inpatient Hospital Stay: Payer: 59

## 2022-09-04 ENCOUNTER — Inpatient Hospital Stay: Payer: 59 | Attending: Hematology and Oncology | Admitting: Hematology and Oncology

## 2022-09-04 ENCOUNTER — Encounter: Payer: Self-pay | Admitting: Hematology and Oncology

## 2022-09-04 VITALS — BP 157/89 | HR 95 | Temp 98.1°F | Resp 16 | Ht 66.0 in | Wt 233.3 lb

## 2022-09-04 DIAGNOSIS — D649 Anemia, unspecified: Secondary | ICD-10-CM

## 2022-09-04 DIAGNOSIS — Z803 Family history of malignant neoplasm of breast: Secondary | ICD-10-CM | POA: Insufficient documentation

## 2022-09-04 LAB — SAMPLE TO BLOOD BANK

## 2022-09-04 LAB — COMPREHENSIVE METABOLIC PANEL
ALT: 14 U/L (ref 0–44)
AST: 18 U/L (ref 15–41)
Albumin: 2.4 g/dL — ABNORMAL LOW (ref 3.5–5.0)
Alkaline Phosphatase: 29 U/L — ABNORMAL LOW (ref 38–126)
Anion gap: <0 — ABNORMAL LOW (ref 5–15)
BUN: 12 mg/dL (ref 6–20)
CO2: 26 mmol/L (ref 22–32)
Calcium: 7.9 mg/dL — ABNORMAL LOW (ref 8.9–10.3)
Chloride: 100 mmol/L (ref 98–111)
Creatinine, Ser: 0.83 mg/dL (ref 0.44–1.00)
GFR, Estimated: >60 mL/min (ref >60)
Glucose, Bld: 89 mg/dL (ref 70–99)
Potassium: 3.7 mmol/L (ref 3.5–5.1)
Sodium: 123 mmol/L — ABNORMAL LOW (ref 135–145)
Total Bilirubin: 0.2 mg/dL — ABNORMAL LOW (ref 0.3–1.2)
Total Protein: >12 g/dL — ABNORMAL HIGH (ref 6.5–8.1)

## 2022-09-04 LAB — CBC WITH DIFFERENTIAL/PLATELET
Abs Immature Granulocytes: 0.05 10*3/uL (ref 0.00–0.07)
Basophils Absolute: 0 10*3/uL (ref 0.0–0.1)
Basophils Relative: 1 %
Eosinophils Absolute: 0.1 10*3/uL (ref 0.0–0.5)
Eosinophils Relative: 2 %
HCT: 24.4 % — ABNORMAL LOW (ref 36.0–46.0)
Hemoglobin: 7.8 g/dL — ABNORMAL LOW (ref 12.0–15.0)
Immature Granulocytes: 1 %
Lymphocytes Relative: 41 %
Lymphs Abs: 2.6 10*3/uL (ref 0.7–4.0)
MCH: 31 pg (ref 26.0–34.0)
MCHC: 32 g/dL (ref 30.0–36.0)
MCV: 96.8 fL (ref 80.0–100.0)
Monocytes Absolute: 0.6 10*3/uL (ref 0.1–1.0)
Monocytes Relative: 9 %
Neutro Abs: 2.9 10*3/uL (ref 1.7–7.7)
Neutrophils Relative %: 46 %
Platelets: 299 10*3/uL (ref 150–400)
RBC: 2.52 MIL/uL — ABNORMAL LOW (ref 3.87–5.11)
RDW: 14.9 % (ref 11.5–15.5)
WBC: 6.3 10*3/uL (ref 4.0–10.5)
nRBC: 0 % (ref 0.0–0.2)

## 2022-09-04 LAB — LACTATE DEHYDROGENASE: LDH: 106 U/L (ref 98–192)

## 2022-09-04 LAB — TYPE AND SCREEN
ABO/RH(D): O POS
Antibody Screen: NEGATIVE

## 2022-09-04 LAB — RETICULOCYTES
Immature Retic Fract: 29.3 % — ABNORMAL HIGH (ref 2.3–15.9)
RBC.: 2.45 MIL/uL — ABNORMAL LOW (ref 3.87–5.11)
Retic Count, Absolute: 72.5 10*3/uL (ref 19.0–186.0)
Retic Ct Pct: 3 % (ref 0.4–3.1)

## 2022-09-04 LAB — IRON AND IRON BINDING CAPACITY (CC-WL,HP ONLY)
Iron: 77 ug/dL (ref 28–170)
Saturation Ratios: 34 % — ABNORMAL HIGH (ref 10.4–31.8)
TIBC: 224 ug/dL — ABNORMAL LOW (ref 250–450)
UIBC: 147 ug/dL — ABNORMAL LOW (ref 148–442)

## 2022-09-04 LAB — FERRITIN: Ferritin: 78 ng/mL (ref 11–307)

## 2022-09-04 LAB — VITAMIN B12: Vitamin B-12: 556 pg/mL (ref 180–914)

## 2022-09-04 NOTE — Progress Notes (Signed)
[facility] CONSULT NOTE  Patient Care Team: Billie Ruddy, MD as PCP - General (Family Medicine) Werner Lean, MD as PCP - Cardiology (Cardiology) Servando Salina, MD as Consulting Physician (Obstetrics and Gynecology)  CHIEF COMPLAINTS/PURPOSE OF CONSULTATION:  Symptomatic anemia.  ASSESSMENT & PLAN:  No problem-specific Assessment & Plan notes found for this encounter.  Orders Placed This Encounter  Procedures   CBC with Differential/Platelet    Standing Status:   Standing    Number of Occurrences:   22    Standing Expiration Date:   09/05/2023   Comprehensive metabolic panel    Standing Status:   Standing    Number of Occurrences:   33    Standing Expiration Date:   09/05/2023   SPEP with reflex to IFE    Standing Status:   Future    Number of Occurrences:   1    Standing Expiration Date:   09/04/2023   Kappa/lambda light chains    Standing Status:   Future    Number of Occurrences:   1    Standing Expiration Date:   09/04/2023   UPEP/TP, 24-Hr Urine    Standing Status:   Future    Standing Expiration Date:   09/05/2023   IgG, IgA, IgM    Standing Status:   Future    Number of Occurrences:   1    Standing Expiration Date:   09/05/2023   Lactate dehydrogenase    Standing Status:   Future    Number of Occurrences:   1    Standing Expiration Date:   09/05/2023   Reticulocytes    Standing Status:   Future    Number of Occurrences:   1    Standing Expiration Date:   09/05/2023   Iron and Iron Binding Capacity ([facility] only)    Standing Status:   Future    Number of Occurrences:   1    Standing Expiration Date:   09/05/2023   Ferritin    Standing Status:   Future    Number of Occurrences:   1    Standing Expiration Date:   09/04/2023   Vitamin B12    Standing Status:   Future    Number of Occurrences:   1    Standing Expiration Date:   09/04/2023   Folate RBC    Standing Status:   Future    Number of Occurrences:   1    Standing  Expiration Date:   09/05/2023   Sample to Blood Bank    Standing Status:   Future    Number of Occurrences:   1    Standing Expiration Date:   09/05/2023   This is a very pleasant 47 year old female patient with a past medical history significant for gestational diabetes and trigeminal neuralgia corrected with surgery, hypertension referred to hematology for evaluation and recommendations regarding severe normocytic normochromic anemia.  Patient reports recent episodes of epistaxis which went on for weeks as well as some menorrhagia but denies any other blood in her stool or melena that could contribute to the severe anemia.  She continues to have palpitations with exertion, feels fatigued, some stiffness in her joints, otherwise denies any major issues.  We have reviewed labs from recent blood work done about a week ago which shows severe normocytic normochromic anemia, no leukopenia or thrombocytopenia, no hypercalcemia or elevated creatinine, increased total protein noted.  Given new onset anemia, very elevated total protein, recommended workup for  myeloma, nutritional deficiencies such as [id number], folic acid repeat iron stores as well as hemolysis labs today. Labs today show stable hemoglobin compared to last week hence there is no urgent need for blood transfusion especially since patient does not report any significant worsening of symptoms. I will plan to see her back in about a week or do a televisit to review additional recommendations.  At this time there is no role for iron infusion.  She apparently started taking some oral iron, this is reasonable especially with her recent history of heavy menstruation however have also ordered repeat ferritin so we will await these labs to discuss further role of oral iron supplementation. She expressed understanding of all the recommendations.  Thank you for consulting [place] in the care of this patient.  Please do not hesitate to contact us with any additional  questions or concerns  HISTORY OF PRESENTING ILLNESS:  Kara Medina 47 y.o. female is here because of severe anemia.  This is a very pleasant 47 yr old female patient with PMH of HTN referred to hematology for severe anemia.  Since January, she has been noticing palpitations which have been quite bothersome, followed up with cardiology.  She also reports about 2 to 3 weeks worth of epistaxis daily which lasted for about 20 minutes with some blood clots as well as menorrhagia with her.  Lasting for almost 2 weeks.  She wondered if her anemia is related to this.  She was recently started on metoprolol by her cardiologist for palpitations however these continue to be present especially on exertion.  Today, she mostly reports palpitations on exertion, had to pause when walking, continues to feel fatigued. No dizziness or fall. No hematochezia or melena ( not sure). No new B symptoms.  She denies any bone pains although she reports some bilateral shoulder stiffness for the past few months. Mother had breast cancer, she is a survivor. She otherwise feels well.  Rest of the pertinent 10 point ROS reviewed and negative.  She works for [employer], denies history of smoking, occasionally drinks alcohol.  Rest of the pertinent 10 point ROS reviewed and negative  MEDICAL HISTORY:  Past Medical History:  Diagnosis Date   Anemia    Gestational diabetes    diet controlled   Postpartum care following vaginal delivery (7/27) 02/09/2016   Pregnancy induced hypertension    Trigeminal neuralgia    2012 corrected with surgery    SURGICAL HISTORY: Past Surgical History:  Procedure Laterality Date   APPENDECTOMY  2008   TRIGEMINAL NERVE DECOMPRESSION  2012    SOCIAL HISTORY: Social History   Socioeconomic History   Marital status: Married    Spouse name: Not on file   Number of children: Not on file   Years of education: Not on file   Highest education level: Bachelor's degree (e.g., BA, AB,  BS)  Occupational History   Not on file  Tobacco Use   Smoking status: Never   Smokeless tobacco: Never  Vaping Use   Vaping Use: Never used  Substance and Sexual Activity   Alcohol use: No   Drug use: No   Sexual activity: Yes  Other Topics Concern   Not on file  Social History Narrative   Not on file   Social Determinants of Health   Financial Resource Strain: Low Risk  (08/23/2022)   Overall Financial Resource Strain (CARDIA)    Difficulty of Paying Living Expenses: Not hard at all  Food Insecurity: No Food  Insecurity (08/23/2022)   Hunger Vital Sign    Worried About Running Out of Food in the Last Year: Never true    Ran Out of Food in the Last Year: Never true  Transportation Needs: No Transportation Needs (08/23/2022)   PRAPARE - Hydrologist (Medical): No    Lack of Transportation (Non-Medical): No  Physical Activity: Insufficiently Active (08/23/2022)   Exercise Vital Sign    Days of Exercise per Week: 2 days    Minutes of Exercise per Session: 30 min  Stress: Stress Concern Present (08/23/2022)   [place]    Feeling of Stress : To some extent  Social Connections: Unknown (08/23/2022)   Social Connection and Isolation Panel [NHANES]    Frequency of Communication with Friends and Family: More than three times a week    Frequency of Social Gatherings with Friends and Family: Once a week    Attends Religious Services: Patient refused    Marine scientist or Organizations: Yes    Attends Music therapist: More than 4 times per year    Marital Status: Married  Human resources officer Violence: Not on file    FAMILY HISTORY: Family History  Problem Relation Age of Onset   Cancer Other    Hypertension Other    Healthy Father    Cancer Paternal Grandfather        stomach, lung   Hypertension Mother    Breast cancer Mother 64   Hypertension Maternal Grandmother     Arthritis Maternal Grandmother    Hypertension Maternal Grandfather    Other Brother        ?prediabetic   Diabetes Paternal Grandmother    Hypertension Paternal Grandmother    Autism Son     ALLERGIES:  is allergic to amoxicillin.  MEDICATIONS:  Current Outpatient Medications  Medication Sig Dispense Refill   amLODipine (NORVASC) 5 MG tablet Take 1 tablet (5 mg total) by mouth daily. 90 tablet 3   Blood Pressure Monitoring (BLOOD PRESSURE CUFF) MISC 1 Device by Does not apply route daily. 1 each 0   ibuprofen (ADVIL) 200 MG tablet Take 400 mg by mouth every 6 (six) hours as needed for headache or moderate pain.     metoprolol tartrate (LOPRESSOR) 25 MG tablet Take 1 tablet (25 mg total) by mouth 2 (two) times daily. 60 tablet 3   Multiple Vitamin (MULTIVITAMIN ADULT PO) Take by mouth.     No current facility-administered medications for this visit.     PHYSICAL EXAMINATION: ECOG PERFORMANCE STATUS: 0 - Asymptomatic  Vitals:   09/04/22 1005  BP: (!) 157/89  Pulse: 95  Resp: 16  Temp: 98.1 F (36.7 C)  SpO2: 100%   Filed Weights   09/04/22 1005  Weight: 233 lb 4.8 oz (105.8 kg)    GENERAL:alert, no distress and comfortable SKIN: skin color, texture, turgor are normal, no rashes or significant lesions EYES: normal, conjunctiva are pink and non-injected, sclera clear OROPHARYNX:no exudate, no erythema and lips, buccal mucosa, and tongue normal  NECK: supple, thyroid normal size, non-tender, without nodularity LYMPH:  no palpable lymphadenopathy in the cervical, axillary LUNGS: clear to auscultation and percussion with normal breathing effort HEART: regular rate & rhythm and no murmurs and no lower extremity edema ABDOMEN:abdomen soft, non-tender and normal bowel sounds Musculoskeletal:no cyanosis of digits and no clubbing  PSYCH: alert & oriented x 3 with fluent speech NEURO: no focal motor/sensory  deficits   LABORATORY DATA:  I have reviewed the data as  listed Lab Results  Component Value Date   WBC 6.3 09/04/2022   HGB 7.8 (L) 09/04/2022   HCT 24.4 (L) 09/04/2022   MCV 96.8 09/04/2022   PLT 299 09/04/2022     Chemistry      Component Value Date/Time   NA 123 (L) 09/04/2022 1037   K 3.7 09/04/2022 1037   CL 100 09/04/2022 1037   CO2 26 09/04/2022 1037   BUN 12 09/04/2022 1037   CREATININE 0.83 09/04/2022 1037   CREATININE 0.95 08/23/2022 1631      Component Value Date/Time   CALCIUM 7.9 (L) 09/04/2022 1037   ALKPHOS 29 (L) 09/04/2022 1037   AST 18 09/04/2022 1037   ALT 14 09/04/2022 1037   BILITOT 0.2 (L) 09/04/2022 1037     CBC showed normocytic normochromic anemia, no evidence of iron deficiency. CMP showed high TP, low albumin, no hypercalcemia or CKD.  RADIOGRAPHIC STUDIES: I have personally reviewed the radiological images as listed and agreed with the findings in the report. No results found.  All questions were answered. The patient knows to call the clinic with any problems, questions or concerns. I spent 45 minutes in the care of this patient including H and P, review of records, counseling and coordination of care.     Benay Pike, MD 09/04/2022 12:06 PM

## 2022-09-05 LAB — KAPPA/LAMBDA LIGHT CHAINS
Kappa free light chain: 1009.1 mg/L — ABNORMAL HIGH (ref 3.3–19.4)
Kappa, lambda light chain ratio: 110.89 — ABNORMAL HIGH (ref 0.26–1.65)
Lambda free light chains: 9.1 mg/L (ref 5.7–26.3)

## 2022-09-05 LAB — IGG, IGA, IGM
IgA: 127 mg/dL (ref 87–352)
IgG (Immunoglobin G), Serum: 13932 mg/dL — ABNORMAL HIGH (ref 586–1602)
IgM (Immunoglobulin M), Srm: 21 mg/dL — ABNORMAL LOW (ref 26–217)

## 2022-09-06 ENCOUNTER — Other Ambulatory Visit: Payer: Self-pay

## 2022-09-06 ENCOUNTER — Other Ambulatory Visit: Payer: Self-pay | Admitting: Family Medicine

## 2022-09-06 DIAGNOSIS — Z1231 Encounter for screening mammogram for malignant neoplasm of breast: Secondary | ICD-10-CM

## 2022-09-06 DIAGNOSIS — D649 Anemia, unspecified: Secondary | ICD-10-CM | POA: Diagnosis not present

## 2022-09-09 ENCOUNTER — Inpatient Hospital Stay (HOSPITAL_BASED_OUTPATIENT_CLINIC_OR_DEPARTMENT_OTHER): Payer: 59 | Admitting: Hematology and Oncology

## 2022-09-09 ENCOUNTER — Telehealth: Payer: Self-pay | Admitting: *Deleted

## 2022-09-09 ENCOUNTER — Other Ambulatory Visit: Payer: Self-pay | Admitting: *Deleted

## 2022-09-09 DIAGNOSIS — D472 Monoclonal gammopathy: Secondary | ICD-10-CM

## 2022-09-09 DIAGNOSIS — D649 Anemia, unspecified: Secondary | ICD-10-CM | POA: Diagnosis not present

## 2022-09-09 LAB — UPEP/TP, 24-HR URINE
Albumin, U: 33.3 %
Alpha 1, Urine: 3 %
Alpha 2, Urine: 3.9 %
Beta, Urine: 6.6 %
Gamma Globulin, Urine: 53.3 %
M-Spike, mg/24 hr: 3033.1 mg/24 hr — ABNORMAL HIGH
M-spike, %: 47.2 % — ABNORMAL HIGH
Total Protein, Urine-Ur/day: 6426 mg/24 hr — ABNORMAL HIGH (ref 30–150)
Total Protein, Urine: 214.2 mg/dL
Total Volume: 3000

## 2022-09-09 LAB — FOLATE RBC
Folate, Hemolysate: 256 ng/mL
Folate, RBC: 1049 ng/mL (ref >498)
Hematocrit: 24.4 % — ABNORMAL LOW (ref 34.0–46.6)

## 2022-09-09 MED ORDER — LIDOCAINE HCL 2 % IJ SOLN: 20.0000 mL | Freq: Once | INTRAMUSCULAR | Status: DC

## 2022-09-09 NOTE — Progress Notes (Signed)
[facility] CONSULT NOTE  Patient Care Team: Billie Ruddy, MD as PCP - General (Family Medicine) Werner Lean, MD as PCP - Cardiology (Cardiology) Servando Salina, MD as Consulting Physician (Obstetrics and Gynecology)  CHIEF COMPLAINTS/PURPOSE OF CONSULTATION:  IgG Kappa Multiple myeloma  ASSESSMENT & PLAN:   This is a very pleasant 47 year old female patient with a past medical history significant for gestational diabetes and trigeminal neuralgia corrected with surgery, hypertension referred to hematology for evaluation and recommendations regarding severe normocytic normochromic anemia.   She is here for telephone visit.  We have reviewed her labs which showed severe anemia but not severe enough to warrant blood transfusion.  Kappa lambda ratio was noted at thousand and significant elevation of IgG noted suggestive of IgG kappa monoclonal protein.  SPEP and UPEP are pending.  No evidence of elevated creatinine or hypercalcemia. I recommended that we proceed with bone survey and bone marrow biopsy ASAP to investigate working diagnosis of multiple myeloma. We have briefly discussed about the multiple myeloma, treatment intent, options for treatment.  She will return to clinic in about a week or 10 days to review the bone survey results and to discuss any additional recommendations.   HISTORY OF PRESENTING ILLNESS:  Kara Medina 47 y.o. female is here because of severe anemia.  This is a very pleasant 47 yr old female patient with PMH of HTN referred to hematology for severe anemia.  Since January, she has been noticing palpitations which have been quite bothersome, followed up with cardiology.  She also reports about 2 to 3 weeks worth of epistaxis daily which lasted for about 20 minutes with some blood clots as well as menorrhagia with her.  Lasting for almost 2 weeks.  She wondered if her anemia is related to this.  During her last visit, she reported  fatigue and palpitations mostly.  She is here for telephone visit today. MEDICAL HISTORY:  Past Medical History:  Diagnosis Date   Anemia    Gestational diabetes    diet controlled   Postpartum care following vaginal delivery (7/27) 02/09/2016   Pregnancy induced hypertension    Trigeminal neuralgia    2012 corrected with surgery    SURGICAL HISTORY: Past Surgical History:  Procedure Laterality Date   APPENDECTOMY  2008   TRIGEMINAL NERVE DECOMPRESSION  2012    SOCIAL HISTORY: Social History   Socioeconomic History   Marital status: Married    Spouse name: Not on file   Number of children: Not on file   Years of education: Not on file   Highest education level: Bachelor's degree (e.g., BA, AB, BS)  Occupational History   Not on file  Tobacco Use   Smoking status: Never   Smokeless tobacco: Never  Vaping Use   Vaping Use: Never used  Substance and Sexual Activity   Alcohol use: No   Drug use: No   Sexual activity: Yes  Other Topics Concern   Not on file  Social History Narrative   Not on file   Social Determinants of Health   Financial Resource Strain: Low Risk  (08/23/2022)   Overall Financial Resource Strain (CARDIA)    Difficulty of Paying Living Expenses: Not hard at all  Food Insecurity: No Food Insecurity (08/23/2022)   Hunger Vital Sign    Worried About Running Out of Food in the Last Year: Never true    Ran Out of Food in the Last Year: Never true  Transportation Needs: No Transportation  Needs (08/23/2022)   PRAPARE - Hydrologist (Medical): No    Lack of Transportation (Non-Medical): No  Physical Activity: Insufficiently Active (08/23/2022)   Exercise Vital Sign    Days of Exercise per Week: 2 days    Minutes of Exercise per Session: 30 min  Stress: Stress Concern Present (08/23/2022)   [place]    Feeling of Stress : To some extent  Social Connections: Unknown  (08/23/2022)   Social Connection and Isolation Panel [NHANES]    Frequency of Communication with Friends and Family: More than three times a week    Frequency of Social Gatherings with Friends and Family: Once a week    Attends Religious Services: Patient refused    Marine scientist or Organizations: Yes    Attends Music therapist: More than 4 times per year    Marital Status: Married  Human resources officer Violence: Not on file    FAMILY HISTORY: Family History  Problem Relation Age of Onset   Cancer Other    Hypertension Other    Healthy Father    Cancer Paternal Grandfather        stomach, lung   Hypertension Mother    Breast cancer Mother 59   Hypertension Maternal Grandmother    Arthritis Maternal Grandmother    Hypertension Maternal Grandfather    Other Brother        ?prediabetic   Diabetes Paternal Grandmother    Hypertension Paternal Grandmother    Autism Son     ALLERGIES:  is allergic to amoxicillin.  MEDICATIONS:  Current Outpatient Medications  Medication Sig Dispense Refill   amLODipine (NORVASC) 5 MG tablet Take 1 tablet (5 mg total) by mouth daily. 90 tablet 3   Blood Pressure Monitoring (BLOOD PRESSURE CUFF) MISC 1 Device by Does not apply route daily. 1 each 0   ibuprofen (ADVIL) 200 MG tablet Take 400 mg by mouth every 6 (six) hours as needed for headache or moderate pain.     metoprolol tartrate (LOPRESSOR) 25 MG tablet Take 1 tablet (25 mg total) by mouth 2 (two) times daily. 60 tablet 3   Multiple Vitamin (MULTIVITAMIN ADULT PO) Take by mouth.     No current facility-administered medications for this visit.   Facility-Administered Medications Ordered in Other Visits  Medication Dose Route Frequency Provider Last Rate Last Admin   [START ON 09/10/2022] lidocaine (XYLOCAINE) 2 % (with pres) injection 400 mg  20 mL Intradermal Once Causey, Charlestine Massed, NP         PHYSICAL EXAMINATION: ECOG PERFORMANCE STATUS: 0 -  Asymptomatic  There were no vitals filed for this visit.  There were no vitals filed for this visit.   Physical exam deferred, telephone visit   LABORATORY DATA:  I have reviewed the data as listed Lab Results  Component Value Date   WBC 6.3 09/04/2022   HGB 7.8 (L) 09/04/2022   HCT 24.4 (L) 09/04/2022   HCT 24.4 (L) 09/04/2022   MCV 96.8 09/04/2022   PLT 299 09/04/2022     Chemistry      Component Value Date/Time   NA 123 (L) 09/04/2022 1037   K 3.7 09/04/2022 1037   CL 100 09/04/2022 1037   CO2 26 09/04/2022 1037   BUN 12 09/04/2022 1037   CREATININE 0.83 09/04/2022 1037   CREATININE 0.95 08/23/2022 1631      Component Value Date/Time   CALCIUM 7.9 (  L) 09/04/2022 1037   ALKPHOS 29 (L) 09/04/2022 1037   AST 18 09/04/2022 1037   ALT 14 09/04/2022 1037   BILITOT 0.2 (L) 09/04/2022 1037     CBC showed normocytic normochromic anemia, no evidence of iron deficiency. CMP showed high TP, low albumin, no hypercalcemia or CKD.  I connected with  DALLAS PUPILLO on 09/09/22 by a telephone application and verified that I am speaking with the correct person using two identifiers.   I discussed the limitations of evaluation and management by telemedicine. The patient expressed understanding and agreed to proceed.   RADIOGRAPHIC STUDIES: I have personally reviewed the radiological images as listed and agreed with the findings in the report. No results found.  All questions were answered. The patient knows to call the clinic with any problems, questions or concerns. I spent 20 minutes in the care of this patient including H and P, review of records, counseling and coordination of care.     Benay Pike, MD 09/09/2022 2:38 PM

## 2022-09-09 NOTE — Telephone Encounter (Signed)
This RN coordinated appointment for BMBX with bed in infusion,LCCNP and flow cytometry from [facility] lab for 09/10/2022.  Informed pt who verbalized understanding of above- procedure explained.  Order for lab post marrow for CBC entered as well as lidocaine.

## 2022-09-09 NOTE — Addendum Note (Signed)
Addended by: Henreitta Leber E on: 09/09/2022 01:03 PM   Modules accepted: Orders

## 2022-09-09 NOTE — Progress Notes (Signed)
Reordered post clarifying dose with pharmacy

## 2022-09-10 ENCOUNTER — Inpatient Hospital Stay (HOSPITAL_BASED_OUTPATIENT_CLINIC_OR_DEPARTMENT_OTHER): Payer: 59 | Admitting: Adult Health

## 2022-09-10 ENCOUNTER — Other Ambulatory Visit: Payer: Self-pay | Admitting: *Deleted

## 2022-09-10 ENCOUNTER — Inpatient Hospital Stay: Payer: 59

## 2022-09-10 VITALS — BP 154/98 | HR 106 | Temp 98.8°F | Resp 18

## 2022-09-10 DIAGNOSIS — D472 Monoclonal gammopathy: Secondary | ICD-10-CM | POA: Diagnosis not present

## 2022-09-10 DIAGNOSIS — D649 Anemia, unspecified: Secondary | ICD-10-CM

## 2022-09-10 LAB — COMPREHENSIVE METABOLIC PANEL
ALT: 16 U/L (ref 0–44)
AST: 23 U/L (ref 15–41)
Albumin: 2.2 g/dL — ABNORMAL LOW (ref 3.5–5.0)
Alkaline Phosphatase: 27 U/L — ABNORMAL LOW (ref 38–126)
Anion gap: <0 — ABNORMAL LOW (ref 5–15)
BUN: 11 mg/dL (ref 6–20)
CO2: 26 mmol/L (ref 22–32)
Calcium: 7.9 mg/dL — ABNORMAL LOW (ref 8.9–10.3)
Chloride: 101 mmol/L (ref 98–111)
Creatinine, Ser: 0.9 mg/dL (ref 0.44–1.00)
GFR, Estimated: >60 mL/min (ref >60)
Glucose, Bld: 85 mg/dL (ref 70–99)
Potassium: 3.9 mmol/L (ref 3.5–5.1)
Sodium: 125 mmol/L — ABNORMAL LOW (ref 135–145)
Total Bilirubin: 0.2 mg/dL — ABNORMAL LOW (ref 0.3–1.2)
Total Protein: 14.2 g/dL — ABNORMAL HIGH (ref 6.5–8.1)

## 2022-09-10 LAB — CBC WITH DIFFERENTIAL/PLATELET
Abs Immature Granulocytes: 0.06 10*3/uL (ref 0.00–0.07)
Basophils Absolute: 0 10*3/uL (ref 0.0–0.1)
Basophils Relative: 1 %
Eosinophils Absolute: 0.1 10*3/uL (ref 0.0–0.5)
Eosinophils Relative: 2 %
HCT: 22.7 % — ABNORMAL LOW (ref 36.0–46.0)
Hemoglobin: 7.3 g/dL — ABNORMAL LOW (ref 12.0–15.0)
Immature Granulocytes: 1 %
Lymphocytes Relative: 36 %
Lymphs Abs: 2.1 10*3/uL (ref 0.7–4.0)
MCH: 31.1 pg (ref 26.0–34.0)
MCHC: 32.2 g/dL (ref 30.0–36.0)
MCV: 96.6 fL (ref 80.0–100.0)
Monocytes Absolute: 0.8 10*3/uL (ref 0.1–1.0)
Monocytes Relative: 13 %
Neutro Abs: 2.8 10*3/uL (ref 1.7–7.7)
Neutrophils Relative %: 47 %
Platelets: 258 10*3/uL (ref 150–400)
RBC: 2.35 MIL/uL — ABNORMAL LOW (ref 3.87–5.11)
RDW: 15 % (ref 11.5–15.5)
WBC: 5.8 10*3/uL (ref 4.0–10.5)
nRBC: 0.3 % — ABNORMAL HIGH (ref 0.0–0.2)

## 2022-09-10 MED ORDER — LIDOCAINE HCL 2 % IJ SOLN
INTRAMUSCULAR | Status: AC
  Filled 2022-09-10: qty 20

## 2022-09-10 NOTE — Progress Notes (Signed)
INDICATION: MGUS r/o myeloma, refractory anemia  Brief examination was performed. ENT: adequate airway clearance Heart: regular rate and rhythm.No Murmurs Lungs: clear to auscultation, no wheezes, normal respiratory effort  Bone Marrow Biopsy and Aspiration Procedure Note   Informed consent was obtained and potential risks including bleeding, infection and pain were reviewed with the patient.  The patient's name, date of birth, identification, consent and allergies were verified prior to the start of procedure and time out was performed.  The left posterior iliac crest was chosen as the site of biopsy.  The skin was prepped with ChloraPrep.   16 cc of 2% lidocaine was used to provide local anaesthesia.   10 cc of bone marrow aspirate was obtained followed by 1cm biopsy.  Pressure was applied to the biopsy site and bandage was placed over the biopsy site. Patient was made to lie on the back for 30 mins prior to discharge.  The procedure was tolerated well. COMPLICATIONS: None BLOOD LOSS: none The patient was discharged home in stable condition with a 1 week follow up to review results.  Patient was provided with post bone marrow biopsy instructions and instructed to call if there was any bleeding or worsening pain.  Specimens sent for flow cytometry, cytogenetics and additional studies.  Signed Scot Dock, NP

## 2022-09-10 NOTE — Patient Instructions (Signed)
Bone Marrow Aspiration and Bone Marrow Biopsy, Adult, Care After The following information offers guidance on how to care for yourself after your procedure. Your health care provider may also give you more specific instructions. If you have problems or questions, contact your health care provider. What can I expect after the procedure? After the procedure, it is common to have: Mild pain and tenderness. Swelling. Bruising. Follow these instructions at home: Incision care  Follow instructions from your health care provider about how to take care of the incision site. Make sure you: Wash your hands with soap and water for at least 20 seconds before and after you change your bandage (dressing). If soap and water are not available, use hand sanitizer. Change your dressing as told by your health care provider. Leave stitches (sutures), skin glue, or adhesive strips in place. These skin closures may need to stay in place for 2 weeks or longer. If adhesive strip edges start to loosen and curl up, you may trim the loose edges. Do not remove adhesive strips completely unless your health care provider tells you to do that. Check your incision site every day for signs of infection. Check for: More redness, swelling, or pain. Fluid or blood. Warmth. Pus or a bad smell. Activity Return to your normal activities as told by your health care provider. Ask your health care provider what activities are safe for you. Do not lift anything that is heavier than 10 lb (4.5 kg), or the limit that you are told, until your health care provider says that it is safe. If you were given a sedative during the procedure, it can affect you for several hours. Do not drive or operate machinery until your health care provider says that it is safe. General instructions  Take over-the-counter and prescription medicines only as told by your health care provider. Do not take baths, swim, or use a hot tub until your health care  provider approves. Ask your health care provider if you may take showers. You may only be allowed to take sponge baths. If directed, put ice on the affected area. To do this: Put ice in a plastic bag. Place a towel between your skin and the bag. Leave the ice on for 20 minutes, 2-3 times a day. If your skin turns bright red, remove the ice right away to prevent skin damage. The risk of skin damage is higher if you cannot feel pain, heat, or cold. Contact a health care provider if: You have signs of infection. Your pain is not controlled with medicine. You have cancer, and a temperature of 100.4F (38C) or higher. Get help right away if: You have a temperature of 101F (38.3C) or higher, or as told by your health care provider. You have bleeding from the incision site that cannot be controlled. This information is not intended to replace advice given to you by your health care provider. Make sure you discuss any questions you have with your health care provider. Document Revised: 11/05/2021 Document Reviewed: 11/05/2021 Elsevier Patient Education  2023 Elsevier Inc.  

## 2022-09-10 NOTE — Progress Notes (Signed)
Pt here today for bone marrow biopsy performed by Wilber Bihari, NP. Minimal bleeding at site and stable vital signs upon discharge. No further concerns from patient and stable upon discharge.

## 2022-09-11 LAB — PROTEIN ELECTROPHORESIS, SERUM, WITH REFLEX
A/G Ratio: 0.3 — ABNORMAL LOW (ref 0.7–1.7)
Albumin ELP: 3.9 g/dL (ref 2.9–4.4)
Alpha-1-Globulin: 0.3 g/dL (ref 0.0–0.4)
Alpha-2-Globulin: 0.8 g/dL (ref 0.4–1.0)
Beta Globulin: 1.2 g/dL (ref 0.7–1.3)
Gamma Globulin: 9 g/dL — ABNORMAL HIGH (ref 0.4–1.8)
Globulin, Total: 11.3 g/dL — ABNORMAL HIGH (ref 2.2–3.9)
M-Spike, %: 8.4 g/dL — ABNORMAL HIGH
SPEP Interpretation: 0
Total Protein ELP: 15.2 g/dL — ABNORMAL HIGH (ref 6.0–8.5)

## 2022-09-11 LAB — IMMUNOFIXATION REFLEX, SERUM
IgA: 157 mg/dL (ref 87–352)
IgG (Immunoglobin G), Serum: 14718 mg/dL — ABNORMAL HIGH (ref 586–1602)
IgM (Immunoglobulin M), Srm: 35 mg/dL (ref 26–217)

## 2022-09-12 ENCOUNTER — Other Ambulatory Visit: Payer: Self-pay | Admitting: Hematology and Oncology

## 2022-09-12 DIAGNOSIS — C9000 Multiple myeloma not having achieved remission: Secondary | ICD-10-CM

## 2022-09-12 DIAGNOSIS — D472 Monoclonal gammopathy: Secondary | ICD-10-CM

## 2022-09-12 LAB — SURGICAL PATHOLOGY

## 2022-09-12 NOTE — Progress Notes (Signed)
START ON PATHWAY REGIMEN - Multiple Myeloma and Other Plasma Cell Dyscrasias   DaraVRd (Daratumumab IV + Bortezomib SUBQ + Lenalidomide PO + Dexamethasone IV/PO) q21 Days (Induction Schema):   A cycle is every 21 days:     Lenalidomide      Dexamethasone      Bortezomib      Daratumumab    DaraVRd (Daratumumab IV + Bortezomib SUBQ + Lenalidomide PO + Dexamethasone IV/PO) q21 Days (Consolidation Schema):   A cycle is every 21 days:     Lenalidomide      Dexamethasone      Bortezomib      Daratumumab   **Always confirm dose/schedule in your pharmacy ordering system**  Patient Characteristics: Multiple Myeloma, Newly Diagnosed, Transplant Eligible, Unknown or Awaiting Test Results Disease Classification: Multiple Myeloma R-ISS Staging: Unknown Therapeutic Status: Newly Diagnosed Is Patient Eligible for Transplant<= Transplant Eligible Risk Status: Awaiting Test Results Intent of Therapy: Non-Curative / Palliative Intent, Discussed with Patient 

## 2022-09-13 ENCOUNTER — Other Ambulatory Visit: Payer: Self-pay

## 2022-09-16 ENCOUNTER — Ambulatory Visit: Payer: 59

## 2022-09-16 ENCOUNTER — Other Ambulatory Visit: Payer: Self-pay | Admitting: *Deleted

## 2022-09-16 ENCOUNTER — Inpatient Hospital Stay: Payer: 59 | Attending: Hematology and Oncology | Admitting: Hematology and Oncology

## 2022-09-16 ENCOUNTER — Other Ambulatory Visit: Payer: Self-pay | Admitting: Hematology

## 2022-09-16 ENCOUNTER — Inpatient Hospital Stay: Payer: 59

## 2022-09-16 DIAGNOSIS — Z803 Family history of malignant neoplasm of breast: Secondary | ICD-10-CM | POA: Insufficient documentation

## 2022-09-16 DIAGNOSIS — D649 Anemia, unspecified: Secondary | ICD-10-CM

## 2022-09-16 DIAGNOSIS — C9000 Multiple myeloma not having achieved remission: Secondary | ICD-10-CM | POA: Diagnosis not present

## 2022-09-16 DIAGNOSIS — Z5112 Encounter for antineoplastic immunotherapy: Secondary | ICD-10-CM | POA: Diagnosis not present

## 2022-09-16 DIAGNOSIS — Z79899 Other long term (current) drug therapy: Secondary | ICD-10-CM | POA: Insufficient documentation

## 2022-09-16 DIAGNOSIS — D472 Monoclonal gammopathy: Secondary | ICD-10-CM | POA: Diagnosis not present

## 2022-09-16 DIAGNOSIS — Z800 Family history of malignant neoplasm of digestive organs: Secondary | ICD-10-CM | POA: Insufficient documentation

## 2022-09-16 DIAGNOSIS — Z801 Family history of malignant neoplasm of trachea, bronchus and lung: Secondary | ICD-10-CM | POA: Diagnosis not present

## 2022-09-16 DIAGNOSIS — D500 Iron deficiency anemia secondary to blood loss (chronic): Secondary | ICD-10-CM

## 2022-09-16 LAB — CBC WITH DIFFERENTIAL (CANCER CENTER ONLY)
Abs Immature Granulocytes: 0.09 10*3/uL — ABNORMAL HIGH (ref 0.00–0.07)
Basophils Absolute: 0 10*3/uL (ref 0.0–0.1)
Basophils Relative: 1 %
Eosinophils Absolute: 0.1 10*3/uL (ref 0.0–0.5)
Eosinophils Relative: 2 %
HCT: 20.3 % — ABNORMAL LOW (ref 36.0–46.0)
Hemoglobin: 6.5 g/dL — CL (ref 12.0–15.0)
Immature Granulocytes: 2 %
Lymphocytes Relative: 39 %
Lymphs Abs: 2.4 10*3/uL (ref 0.7–4.0)
MCH: 31.3 pg (ref 26.0–34.0)
MCHC: 32 g/dL (ref 30.0–36.0)
MCV: 97.6 fL (ref 80.0–100.0)
Monocytes Absolute: 0.8 10*3/uL (ref 0.1–1.0)
Monocytes Relative: 13 %
Neutro Abs: 2.7 10*3/uL (ref 1.7–7.7)
Neutrophils Relative %: 43 %
Platelet Count: 238 10*3/uL (ref 150–400)
RBC: 2.08 MIL/uL — ABNORMAL LOW (ref 3.87–5.11)
RDW: 15.1 % (ref 11.5–15.5)
WBC Count: 6.1 10*3/uL (ref 4.0–10.5)
nRBC: 0.3 % — ABNORMAL HIGH (ref 0.0–0.2)

## 2022-09-16 LAB — CMP (CANCER CENTER ONLY)
ALT: 13 U/L (ref 0–44)
AST: 18 U/L (ref 15–41)
Albumin: 2.2 g/dL — ABNORMAL LOW (ref 3.5–5.0)
Alkaline Phosphatase: 26 U/L — ABNORMAL LOW (ref 38–126)
Anion gap: <0 — ABNORMAL LOW (ref 5–15)
BUN: 17 mg/dL (ref 6–20)
CO2: 26 mmol/L (ref 22–32)
Calcium: 7.9 mg/dL — ABNORMAL LOW (ref 8.9–10.3)
Chloride: 102 mmol/L (ref 98–111)
Creatinine: 1 mg/dL (ref 0.44–1.00)
GFR, Estimated: >60 mL/min (ref >60)
Glucose, Bld: 77 mg/dL (ref 70–99)
Potassium: 3.8 mmol/L (ref 3.5–5.1)
Sodium: 125 mmol/L — ABNORMAL LOW (ref 135–145)
Total Bilirubin: 0.2 mg/dL — ABNORMAL LOW (ref 0.3–1.2)
Total Protein: >12 g/dL — ABNORMAL HIGH (ref 6.5–8.1)

## 2022-09-16 LAB — SAMPLE TO BLOOD BANK

## 2022-09-16 LAB — PREPARE RBC (CROSSMATCH)

## 2022-09-16 LAB — PRETREATMENT RBC PHENOTYPE

## 2022-09-16 MED ORDER — DEXAMETHASONE 4 MG PO TABS: ORAL_TABLET | ORAL | 5 refills | Status: DC

## 2022-09-16 MED ORDER — LIDOCAINE-PRILOCAINE 2.5-2.5 % EX CREA: TOPICAL_CREAM | CUTANEOUS | 3 refills | Status: DC

## 2022-09-16 MED ORDER — ONDANSETRON HCL 8 MG PO TABS: 8.0000 mg | ORAL_TABLET | Freq: Three times a day (TID) | ORAL | 1 refills | Status: DC | PRN

## 2022-09-16 MED ORDER — ACYCLOVIR 400 MG PO TABS: 400.0000 mg | ORAL_TABLET | Freq: Two times a day (BID) | ORAL | 5 refills | Status: DC

## 2022-09-16 MED ORDER — PROCHLORPERAZINE MALEATE 10 MG PO TABS: 10.0000 mg | ORAL_TABLET | Freq: Four times a day (QID) | ORAL | 1 refills | Status: DC | PRN

## 2022-09-16 MED ORDER — DEXAMETHASONE 4 MG PO TABS: 40.0000 mg | ORAL_TABLET | Freq: Once | ORAL | 0 refills | Status: AC

## 2022-09-16 NOTE — Progress Notes (Signed)
[facility] CONSULT NOTE  Patient Care Team: Billie Ruddy, MD as PCP - General (Family Medicine) Werner Lean, MD as PCP - Cardiology (Cardiology) Servando Salina, MD as Consulting Physician (Obstetrics and Gynecology)  CHIEF COMPLAINTS/PURPOSE OF CONSULTATION:  IgG Kappa Multiple myeloma  ASSESSMENT & PLAN:   This is a very pleasant 47 year old female patient with a past medical history significant for gestational diabetes and trigeminal neuralgia corrected with surgery, hypertension referred to hematology for evaluation and recommendations regarding severe normocytic normochromic anemia.   We have reviewed her labs which showed severe anemia and elevated total protein.  SPEP showed 8.4 g/dL, IgG kappa.  Kappa lambda ratio at 110.89 No evidence of endorgan damage so far.  BMB confirmed Ig G kappa MM, bone survey pending scheduled for Wednesday. I did review the BMB findings briefly, FISH and cytogenetics pending. I reviewed pathology findings with Dr Gari Crown from pathology. We will start her on dara VRD without revelimid and will start revelimid at 15 mg dose once her anemia improves. I will also refer her to BMT at [facility] for further recommendations.  I have discussed this in detail with the patient.  She is on board with all the recommendations.  She understands that auto transplant is standard of care for multiple myeloma after induction chemotherapy    HISTORY OF PRESENTING ILLNESS:  Kara Medina 47 y.o. female is here because of severe anemia.  This is a very pleasant 47 yr old female patient with PMH of HTN referred to hematology for severe anemia.  Lab evaluation consistent with IgG kappa multiple myeloma.  Bone marrow biopsy also confirms suspected diagnosis of multiple myeloma.  Bone survey pending.  FISH and cytogenetics pending.  Today she just complains of ongoing fatigue, no chest pain, shortness of breath or dizziness.  She is here today with  her husband.  Rest of the pertinent 10 point ROS reviewed and negative  MEDICAL HISTORY:  Past Medical History:  Diagnosis Date   Anemia    Gestational diabetes    diet controlled   Postpartum care following vaginal delivery (7/27) 02/09/2016   Pregnancy induced hypertension    Trigeminal neuralgia    2012 corrected with surgery    SURGICAL HISTORY: Past Surgical History:  Procedure Laterality Date   APPENDECTOMY  2008   TRIGEMINAL NERVE DECOMPRESSION  2012    SOCIAL HISTORY: Social History   Socioeconomic History   Marital status: Married    Spouse name: Not on file   Number of children: Not on file   Years of education: Not on file   Highest education level: Bachelor's degree (e.g., BA, AB, BS)  Occupational History   Not on file  Tobacco Use   Smoking status: Never   Smokeless tobacco: Never  Vaping Use   Vaping Use: Never used  Substance and Sexual Activity   Alcohol use: No   Drug use: No   Sexual activity: Yes  Other Topics Concern   Not on file  Social History Narrative   Not on file   Social Determinants of Health   Financial Resource Strain: Low Risk  (08/23/2022)   Overall Financial Resource Strain (CARDIA)    Difficulty of Paying Living Expenses: Not hard at all  Food Insecurity: No Food Insecurity (08/23/2022)   Hunger Vital Sign    Worried About Running Out of Food in the Last Year: Never true    Ran Out of Food in the Last Year: Never true  Transportation  Needs: No Transportation Needs (08/23/2022)   PRAPARE - Hydrologist (Medical): No    Lack of Transportation (Non-Medical): No  Physical Activity: Insufficiently Active (08/23/2022)   Exercise Vital Sign    Days of Exercise per Week: 2 days    Minutes of Exercise per Session: 30 min  Stress: Stress Concern Present (08/23/2022)   [place]    Feeling of Stress : To some extent  Social Connections: Unknown  (08/23/2022)   Social Connection and Isolation Panel [NHANES]    Frequency of Communication with Friends and Family: More than three times a week    Frequency of Social Gatherings with Friends and Family: Once a week    Attends Religious Services: Patient refused    Marine scientist or Organizations: Yes    Attends Music therapist: More than 4 times per year    Marital Status: Married  Human resources officer Violence: Not on file    FAMILY HISTORY: Family History  Problem Relation Age of Onset   Cancer Other    Hypertension Other    Healthy Father    Cancer Paternal Grandfather        stomach, lung   Hypertension Mother    Breast cancer Mother 62   Hypertension Maternal Grandmother    Arthritis Maternal Grandmother    Hypertension Maternal Grandfather    Other Brother        ?prediabetic   Diabetes Paternal Grandmother    Hypertension Paternal Grandmother    Autism Son     ALLERGIES:  is allergic to amoxicillin.  MEDICATIONS:  Current Outpatient Medications  Medication Sig Dispense Refill   dexamethasone (DECADRON) 4 MG tablet Take 10 tablets (40 mg total) by mouth once for 1 dose. 10 tablet 0   acyclovir (ZOVIRAX) 400 MG tablet Take 1 tablet (400 mg total) by mouth 2 (two) times daily. 60 tablet 5   amLODipine (NORVASC) 5 MG tablet Take 1 tablet (5 mg total) by mouth daily. 90 tablet 3   Blood Pressure Monitoring (BLOOD PRESSURE CUFF) MISC 1 Device by Does not apply route daily. 1 each 0   dexamethasone (DECADRON) 4 MG tablet Take 5 tabs (20 mg) weekly the day after daratumumab for 12 weeks. Take with breakfast. 20 tablet 5   ibuprofen (ADVIL) 200 MG tablet Take 400 mg by mouth every 6 (six) hours as needed for headache or moderate pain.     lidocaine-prilocaine (EMLA) cream Apply to affected area once 30 g 3   metoprolol tartrate (LOPRESSOR) 25 MG tablet Take 1 tablet (25 mg total) by mouth 2 (two) times daily. 60 tablet 3   Multiple Vitamin (MULTIVITAMIN  ADULT PO) Take by mouth.     ondansetron (ZOFRAN) 8 MG tablet Take 1 tablet (8 mg total) by mouth every 8 (eight) hours as needed for nausea or vomiting. 30 tablet 1   prochlorperazine (COMPAZINE) 10 MG tablet Take 1 tablet (10 mg total) by mouth every 6 (six) hours as needed for nausea or vomiting. 30 tablet 1   No current facility-administered medications for this visit.     PHYSICAL EXAMINATION: ECOG PERFORMANCE STATUS: 0 - Asymptomatic  Vitals:   09/16/22 1141  BP: (!) 148/90  Pulse: 100  Resp: 16  Temp: 98.1 F (36.7 C)  SpO2: 99%    Filed Weights   09/16/22 1141  Weight: 231 lb 9.6 oz (105.1 kg)     Physical  exam deferred, telephone visit   LABORATORY DATA:  I have reviewed the data as listed Lab Results  Component Value Date   WBC 6.1 09/16/2022   HGB 6.5 (LL) 09/16/2022   HCT 20.3 (L) 09/16/2022   MCV 97.6 09/16/2022   PLT 238 09/16/2022     Chemistry      Component Value Date/Time   NA 125 (L) 09/16/2022 1126   K 3.8 09/16/2022 1126   CL 102 09/16/2022 1126   CO2 26 09/16/2022 1126   BUN 17 09/16/2022 1126   CREATININE 1.00 09/16/2022 1126   CREATININE 0.95 08/23/2022 1631      Component Value Date/Time   CALCIUM 7.9 (L) 09/16/2022 1126   ALKPHOS 26 (L) 09/16/2022 1126   AST 18 09/16/2022 1126   ALT 13 09/16/2022 1126   BILITOT 0.2 (L) 09/16/2022 1126        RADIOGRAPHIC STUDIES: I have personally reviewed the radiological images as listed and agreed with the findings in the report. No results found.  All questions were answered. The patient knows to call the clinic with any problems, questions or concerns. I spent 30 minutes in the care of this patient including H and P, review of records, counseling and coordination of care.     Benay Pike, MD 09/16/2022 4:22 PM

## 2022-09-17 ENCOUNTER — Inpatient Hospital Stay: Payer: 59

## 2022-09-17 ENCOUNTER — Encounter (HOSPITAL_COMMUNITY): Payer: Self-pay | Admitting: Hematology and Oncology

## 2022-09-17 DIAGNOSIS — C9000 Multiple myeloma not having achieved remission: Secondary | ICD-10-CM

## 2022-09-17 DIAGNOSIS — D649 Anemia, unspecified: Secondary | ICD-10-CM

## 2022-09-17 DIAGNOSIS — Z5112 Encounter for antineoplastic immunotherapy: Secondary | ICD-10-CM | POA: Diagnosis not present

## 2022-09-17 LAB — BETA 2 MICROGLOBULIN, SERUM: Beta-2 Microglobulin: 5.7 mg/L — ABNORMAL HIGH (ref 0.6–2.4)

## 2022-09-17 MED ORDER — DIPHENHYDRAMINE HCL 25 MG PO CAPS
25.0000 mg | ORAL_CAPSULE | Freq: Once | ORAL | Status: AC
  Administered 2022-09-17: 25 mg via ORAL
  Filled 2022-09-17: qty 1

## 2022-09-17 MED ORDER — SODIUM CHLORIDE 0.9% IV SOLUTION
250.0000 mL | Freq: Once | INTRAVENOUS | Status: AC
  Administered 2022-09-17: 250 mL via INTRAVENOUS

## 2022-09-17 MED ORDER — ACETAMINOPHEN 325 MG PO TABS
650.0000 mg | ORAL_TABLET | Freq: Once | ORAL | Status: AC
  Administered 2022-09-17: 650 mg via ORAL
  Filled 2022-09-17: qty 2

## 2022-09-17 NOTE — Patient Instructions (Signed)

## 2022-09-18 ENCOUNTER — Telehealth: Payer: Self-pay | Admitting: Hematology and Oncology

## 2022-09-18 ENCOUNTER — Ambulatory Visit (HOSPITAL_COMMUNITY)
Admission: RE | Admit: 2022-09-18 | Discharge: 2022-09-18 | Disposition: A | Discharge status: Home / Self Care | Payer: 59 | Source: Ambulatory Visit | Attending: Hematology and Oncology | Admitting: Hematology and Oncology

## 2022-09-18 ENCOUNTER — Other Ambulatory Visit: Payer: Self-pay | Admitting: *Deleted

## 2022-09-18 ENCOUNTER — Encounter: Payer: Self-pay | Admitting: Hematology and Oncology

## 2022-09-18 DIAGNOSIS — D472 Monoclonal gammopathy: Secondary | ICD-10-CM

## 2022-09-18 DIAGNOSIS — D649 Anemia, unspecified: Secondary | ICD-10-CM | POA: Diagnosis not present

## 2022-09-18 LAB — TYPE AND SCREEN
ABO/RH(D): O POS
Antibody Screen: NEGATIVE
Unit division: 0
Unit division: 0

## 2022-09-18 LAB — BPAM RBC
Blood Product Expiration Date: 202403312359
Blood Product Expiration Date: 202403312359
ISSUE DATE / TIME: 202403050846
ISSUE DATE / TIME: 202403051050
Unit Type and Rh: 5100
Unit Type and Rh: 5100

## 2022-09-18 NOTE — Telephone Encounter (Signed)
Spoke with patient confirming appointment change

## 2022-09-19 ENCOUNTER — Other Ambulatory Visit: Payer: Self-pay | Admitting: *Deleted

## 2022-09-19 DIAGNOSIS — C9000 Multiple myeloma not having achieved remission: Secondary | ICD-10-CM

## 2022-09-19 DIAGNOSIS — D649 Anemia, unspecified: Secondary | ICD-10-CM

## 2022-09-19 DIAGNOSIS — D472 Monoclonal gammopathy: Secondary | ICD-10-CM

## 2022-09-19 MED FILL — Dexamethasone Sodium Phosphate Inj 100 MG/10ML: INTRAMUSCULAR | Qty: 2 | Status: AC

## 2022-09-20 ENCOUNTER — Other Ambulatory Visit: Payer: Self-pay | Admitting: *Deleted

## 2022-09-20 ENCOUNTER — Inpatient Hospital Stay: Payer: 59

## 2022-09-20 ENCOUNTER — Ambulatory Visit: Payer: 59

## 2022-09-20 VITALS — BP 151/99 | HR 91 | Temp 98.7°F | Resp 17 | Wt 232.2 lb

## 2022-09-20 DIAGNOSIS — C9000 Multiple myeloma not having achieved remission: Secondary | ICD-10-CM

## 2022-09-20 DIAGNOSIS — D649 Anemia, unspecified: Secondary | ICD-10-CM

## 2022-09-20 DIAGNOSIS — Z5112 Encounter for antineoplastic immunotherapy: Secondary | ICD-10-CM | POA: Diagnosis not present

## 2022-09-20 DIAGNOSIS — D472 Monoclonal gammopathy: Secondary | ICD-10-CM

## 2022-09-20 LAB — CBC WITH DIFFERENTIAL (CANCER CENTER ONLY)
Abs Immature Granulocytes: 0.06 10*3/uL (ref 0.00–0.07)
Basophils Absolute: 0 10*3/uL (ref 0.0–0.1)
Basophils Relative: 0 %
Eosinophils Absolute: 0.1 10*3/uL (ref 0.0–0.5)
Eosinophils Relative: 1 %
HCT: 25.8 % — ABNORMAL LOW (ref 36.0–46.0)
Hemoglobin: 8.1 g/dL — ABNORMAL LOW (ref 12.0–15.0)
Immature Granulocytes: 1 %
Lymphocytes Relative: 42 %
Lymphs Abs: 3.4 10*3/uL (ref 0.7–4.0)
MCH: 29.8 pg (ref 26.0–34.0)
MCHC: 31.4 g/dL (ref 30.0–36.0)
MCV: 94.9 fL (ref 80.0–100.0)
Monocytes Absolute: 0.6 10*3/uL (ref 0.1–1.0)
Monocytes Relative: 8 %
Neutro Abs: 3.9 10*3/uL (ref 1.7–7.7)
Neutrophils Relative %: 48 %
Platelet Count: 254 10*3/uL (ref 150–400)
RBC: 2.72 MIL/uL — ABNORMAL LOW (ref 3.87–5.11)
RDW: 18.2 % — ABNORMAL HIGH (ref 11.5–15.5)
WBC Count: 8.1 10*3/uL (ref 4.0–10.5)
nRBC: 0.2 % (ref 0.0–0.2)

## 2022-09-20 LAB — COMPREHENSIVE METABOLIC PANEL
ALT: 16 U/L (ref 0–44)
AST: 17 U/L (ref 15–41)
Albumin: 2.2 g/dL — ABNORMAL LOW (ref 3.5–5.0)
Alkaline Phosphatase: 33 U/L — ABNORMAL LOW (ref 38–126)
Anion gap: <0 — ABNORMAL LOW (ref 5–15)
BUN: 23 mg/dL — ABNORMAL HIGH (ref 6–20)
CO2: 26 mmol/L (ref 22–32)
Calcium: 7.5 mg/dL — ABNORMAL LOW (ref 8.9–10.3)
Chloride: 102 mmol/L (ref 98–111)
Creatinine, Ser: 1 mg/dL (ref 0.44–1.00)
GFR, Estimated: >60 mL/min (ref >60)
Glucose, Bld: 105 mg/dL — ABNORMAL HIGH (ref 70–99)
Potassium: 3.7 mmol/L (ref 3.5–5.1)
Sodium: 125 mmol/L — ABNORMAL LOW (ref 135–145)
Total Bilirubin: 0.1 mg/dL — ABNORMAL LOW (ref 0.3–1.2)
Total Protein: 14.1 g/dL — ABNORMAL HIGH (ref 6.5–8.1)

## 2022-09-20 LAB — SAMPLE TO BLOOD BANK

## 2022-09-20 LAB — PREGNANCY, URINE: Preg Test, Ur: NEGATIVE

## 2022-09-20 MED ORDER — MONTELUKAST SODIUM 10 MG PO TABS
10.0000 mg | ORAL_TABLET | Freq: Once | ORAL | Status: AC
  Administered 2022-09-20: 10 mg via ORAL
  Filled 2022-09-20: qty 1

## 2022-09-20 MED ORDER — SODIUM CHLORIDE 0.9 % IV SOLN: Freq: Once | INTRAVENOUS | Status: AC

## 2022-09-20 MED ORDER — ACETAMINOPHEN 325 MG PO TABS
650.0000 mg | ORAL_TABLET | Freq: Once | ORAL | Status: AC
  Administered 2022-09-20: 650 mg via ORAL
  Filled 2022-09-20: qty 2

## 2022-09-20 MED ORDER — SODIUM CHLORIDE 0.9 % IV SOLN
16.0000 mg/kg | Freq: Once | INTRAVENOUS | Status: AC
  Administered 2022-09-20: 1700 mg via INTRAVENOUS
  Filled 2022-09-20: qty 5

## 2022-09-20 MED ORDER — DIPHENHYDRAMINE HCL 25 MG PO CAPS
50.0000 mg | ORAL_CAPSULE | Freq: Once | ORAL | Status: AC
  Administered 2022-09-20: 50 mg via ORAL
  Filled 2022-09-20: qty 2

## 2022-09-20 MED ORDER — SODIUM CHLORIDE 0.9 % IV SOLN
20.0000 mg | Freq: Once | INTRAVENOUS | Status: AC
  Administered 2022-09-20: 20 mg via INTRAVENOUS
  Filled 2022-09-20: qty 20

## 2022-09-20 MED ORDER — BORTEZOMIB CHEMO SQ INJECTION 3.5 MG (2.5MG/ML)
1.3000 mg/m2 | Freq: Once | INTRAMUSCULAR | Status: AC
  Administered 2022-09-20: 3 mg via SUBCUTANEOUS
  Filled 2022-09-20: qty 1.2

## 2022-09-20 NOTE — Patient Instructions (Signed)
[facility]  Discharge Instructions: Thank you for choosing [facility] to provide your oncology and hematology care.   If you have a lab appointment with the [facility], please go directly to the [facility] and check in at the registration area.   Wear comfortable clothing and clothing appropriate for easy access to any Portacath or PICC line.   We strive to give you quality time with your provider. You may need to reschedule your appointment if you arrive late (15 or more minutes).  Arriving late affects you and other patients whose appointments are after yours.  Also, if you miss three or more appointments without notifying the office, you may be dismissed from the clinic at the provider's discretion.      For prescription refill requests, have your pharmacy contact our office and allow 72 hours for refills to be completed.    Today you received the following chemotherapy and/or immunotherapy agents: bortezomib & daratumumab      To help prevent nausea and vomiting after your treatment, we encourage you to take your nausea medication as directed.  BELOW ARE SYMPTOMS THAT SHOULD BE REPORTED IMMEDIATELY: *FEVER GREATER THAN 100.4 F (38 C) OR HIGHER *CHILLS OR SWEATING *NAUSEA AND VOMITING THAT IS NOT CONTROLLED WITH YOUR NAUSEA MEDICATION *UNUSUAL SHORTNESS OF BREATH *UNUSUAL BRUISING OR BLEEDING *URINARY PROBLEMS (pain or burning when urinating, or frequent urination) *BOWEL PROBLEMS (unusual diarrhea, constipation, pain near the anus) TENDERNESS IN MOUTH AND THROAT WITH OR WITHOUT PRESENCE OF ULCERS (sore throat, sores in mouth, or a toothache) UNUSUAL RASH, SWELLING OR PAIN  UNUSUAL VAGINAL DISCHARGE OR ITCHING   Items with * indicate a potential emergency and should be followed up as soon as possible or go to the Emergency Department if any problems should occur.  Please show the CHEMOTHERAPY ALERT CARD or IMMUNOTHERAPY  ALERT CARD at check-in to the Emergency Department and triage nurse.  Should you have questions after your visit or need to cancel or reschedule your appointment, please contact [facility]  Dept: [phone]  and follow the prompts.  Office hours are 8:00 a.m. to 4:30 p.m. Monday - Friday. Please note that voicemails left after 4:00 p.m. may not be returned until the following business day.  We are closed weekends and major holidays. You have access to a nurse at all times for urgent questions. Please call the main number to the clinic Dept: [phone] and follow the prompts.   For any non-urgent questions, you may also contact your provider using MyChart. We now offer e-Visits for anyone 52 and older to request care online for non-urgent symptoms. For details visit [email or web address].   Also download the MyChart app! Go to the app store, search "MyChart", open the app, select [facility], and log in with your MyChart username and password.  Bortezomib Injection What is this medication? BORTEZOMIB (bor TEZ oh mib) treats lymphoma. It may also be used to treat multiple myeloma, a type of bone marrow cancer. It works by blocking a protein that causes cancer cells to grow and multiply. This helps to slow or stop the spread of cancer cells. This medicine may be used for other purposes; ask your health care provider or pharmacist if you have questions. COMMON BRAND NAME(S): Velcade What should I tell my care team before I take this medication? They need to know if you have any of these conditions: Dehydration Diabetes Heart disease Liver  disease Tingling of the fingers or toes or other nerve disorder An unusual or allergic reaction to bortezomib, other medications, foods, dyes, or preservatives If you or your partner are pregnant or trying to get pregnant Breastfeeding How should I use this medication? This medication is injected into a vein or under  the skin. It is given by your care team in a hospital or clinic setting. Talk to your care team about the use of this medication in children. Special care may be needed. Overdosage: If you think you have taken too much of this medicine contact a poison control center or emergency room at once. NOTE: This medicine is only for you. Do not share this medicine with others. What if I miss a dose? Keep appointments for follow-up doses. It is important not to miss your dose. Call your care team if you are unable to keep an appointment. What may interact with this medication? Ketoconazole Rifampin This list may not describe all possible interactions. Give your health care provider a list of all the medicines, herbs, non-prescription drugs, or dietary supplements you use. Also tell them if you smoke, drink alcohol, or use illegal drugs. Some items may interact with your medicine. What should I watch for while using this medication? Your condition will be monitored carefully while you are receiving this medication. You may need blood work while taking this medication. This medication may affect your coordination, reaction time, or judgment. Do not drive or operate machinery until you know how this medication affects you. Sit up or stand slowly to reduce the risk of dizzy or fainting spells. Drinking alcohol with this medication can increase the risk of these side effects. This medication may increase your risk of getting an infection. Call your care team for advice if you get a fever, chills, sore throat, or other symptoms of a cold or flu. Do not treat yourself. Try to avoid being around people who are sick. Check with your care team if you have severe diarrhea, nausea, and vomiting, or if you sweat a lot. The loss of too much body fluid may make it dangerous for you to take this medication. Talk to your care team if you may be pregnant. Serious birth defects can occur if you take this medication during  pregnancy and for 7 months after the last dose. You will need a negative pregnancy test before starting this medication. Contraception is recommended while taking this medication and for 7 months after the last dose. Your care team can help you find the option that works for you. If your partner can get pregnant, use a condom during sex while taking this medication and for 4 months after the last dose. Do not breastfeed while taking this medication and for 2 months after the last dose. This medication may cause infertility. Talk to your care team if you are concerned about your fertility. What side effects may I notice from receiving this medication? Side effects that you should report to your care team as soon as possible: Allergic reactions--skin rash, itching, hives, swelling of the face, lips, tongue, or throat Bleeding--bloody or black, tar-like stools, vomiting blood or brown material that looks like coffee grounds, red or dark brown urine, small red or purple spots on skin, unusual bruising or bleeding Bleeding in the brain--severe headache, stiff neck, confusion, dizziness, change in vision, numbness or weakness of the face, arm, or leg, trouble speaking, trouble walking, vomiting Bowel blockage--stomach cramping, unable to have a bowel movement or pass gas,  loss of appetite, vomiting Heart failure--shortness of breath, swelling of the ankles, feet, or hands, sudden weight gain, unusual weakness or fatigue Infection--fever, chills, cough, sore throat, wounds that don't heal, pain or trouble when passing urine, general feeling of discomfort or being unwell Liver injury--right upper belly pain, loss of appetite, nausea, light-colored stool, dark yellow or brown urine, yellowing skin or eyes, unusual weakness or fatigue Low blood pressure--dizziness, feeling faint or lightheaded, blurry vision Lung injury--shortness of breath or trouble breathing, cough, spitting up blood, chest pain, fever Pain,  tingling, or numbness in the hands or feet Severe or prolonged diarrhea Stomach pain, bloody diarrhea, pale skin, unusual weakness or fatigue, decrease in the amount of urine, which may be signs of hemolytic uremic syndrome Sudden and severe headache, confusion, change in vision, seizures, which may be signs of posterior reversible encephalopathy syndrome (PRES) TTP--purple spots on the skin or inside the mouth, pale skin, yellowing skin or eyes, unusual weakness or fatigue, fever, fast or irregular heartbeat, confusion, change in vision, trouble speaking, trouble walking Tumor lysis syndrome (TLS)--nausea, vomiting, diarrhea, decrease in the amount of urine, dark urine, unusual weakness or fatigue, confusion, muscle pain or cramps, fast or irregular heartbeat, joint pain Side effects that usually do not require medical attention (report to your care team if they continue or are bothersome): Constipation Diarrhea Fatigue Loss of appetite Nausea This list may not describe all possible side effects. Call your doctor for medical advice about side effects. You may report side effects to FDA at 1-800-FDA-1088. Where should I keep my medication? This medication is given in a hospital or clinic. It will not be stored at home. NOTE: This sheet is a summary. It may not cover all possible information. If you have questions about this medicine, talk to your doctor, pharmacist, or health care provider.  2023 Elsevier/Gold Standard (2021-11-28 00:00:00)  Daratumumab; Hyaluronidase Injection What is this medication? DARATUMUMAB; HYALURONIDASE (dar a toom ue mab; hye al ur ON i dase) treats multiple myeloma, a type of bone marrow cancer. Daratumumab works by blocking a protein that causes cancer cells to grow and multiply. This helps to slow or stop the spread of cancer cells. Hyaluronidase works by increasing the absorption of other medications in the body to help them work better. This medication may also be  used treat amyloidosis, a condition that causes the buildup of a protein (amyloid) in your body. It works by reducing the buildup of this protein, which decreases symptoms. It is a combination medication that contains a monoclonal antibody. This medicine may be used for other purposes; ask your health care provider or pharmacist if you have questions. COMMON BRAND NAME(S): DARZALEX FASPRO What should I tell my care team before I take this medication? They need to know if you have any of these conditions: Heart disease Infection, such as chickenpox, cold sores, herpes, hepatitis B Lung or breathing disease An unusual or allergic reaction to daratumumab, hyaluronidase, other medications, foods, dyes, or preservatives Pregnant or trying to get pregnant Breast-feeding How should I use this medication? This medication is injected under the skin. It is given by your care team in a hospital or clinic setting. Talk to your care team about the use of this medication in children. Special care may be needed. Overdosage: If you think you have taken too much of this medicine contact a poison control center or emergency room at once. NOTE: This medicine is only for you. Do not share this medicine with others. What  if I miss a dose? Keep appointments for follow-up doses. It is important not to miss your dose. Call your care team if you are unable to keep an appointment. What may interact with this medication? Interactions have not been studied. This list may not describe all possible interactions. Give your health care provider a list of all the medicines, herbs, non-prescription drugs, or dietary supplements you use. Also tell them if you smoke, drink alcohol, or use illegal drugs. Some items may interact with your medicine. What should I watch for while using this medication? Your condition will be monitored carefully while you are receiving this medication. This medication can cause serious allergic  reactions. To reduce your risk, your care team may give you other medication to take before receiving this one. Be sure to follow the directions from your care team. This medication can affect the results of blood tests to match your blood type. These changes can last for up to 6 months after the final dose. Your care team will do blood tests to match your blood type before you start treatment. Tell all of your care team that you are being treated with this medication before receiving a blood transfusion. This medication can affect the results of some tests used to determine treatment response; extra tests may be needed to evaluate response. Talk to your care team if you wish to become pregnant or think you are pregnant. This medication can cause serious birth defects if taken during pregnancy and for 3 months after the last dose. A reliable form of contraception is recommended while taking this medication and for 3 months after the last dose. Talk to your care team about effective forms of contraception. Do not breast-feed while taking this medication. What side effects may I notice from receiving this medication? Side effects that you should report to your care team as soon as possible: Allergic reactions--skin rash, itching, hives, swelling of the face, lips, tongue, or throat Heart rhythm changes--fast or irregular heartbeat, dizziness, feeling faint or lightheaded, chest pain, trouble breathing Infection--fever, chills, cough, sore throat, wounds that don't heal, pain or trouble when passing urine, general feeling of discomfort or being unwell Infusion reactions--chest pain, shortness of breath or trouble breathing, feeling faint or lightheaded Sudden eye pain or change in vision such as blurry vision, seeing halos around lights, vision loss Unusual bruising or bleeding Side effects that usually do not require medical attention (report to your care team if they continue or are  bothersome): Constipation Diarrhea Fatigue Nausea Pain, tingling, or numbness in the hands or feet Swelling of the ankles, hands, or feet This list may not describe all possible side effects. Call your doctor for medical advice about side effects. You may report side effects to FDA at 1-800-FDA-1088. Where should I keep my medication? This medication is given in a hospital or clinic. It will not be stored at home. NOTE: This sheet is a summary. It may not cover all possible information. If you have questions about this medicine, talk to your doctor, pharmacist, or health care provider.  2023 Elsevier/Gold Standard (2021-10-24 00:00:00)

## 2022-09-23 ENCOUNTER — Inpatient Hospital Stay: Payer: 59

## 2022-09-23 ENCOUNTER — Encounter (HOSPITAL_COMMUNITY): Payer: Self-pay | Admitting: Hematology and Oncology

## 2022-09-23 ENCOUNTER — Ambulatory Visit: Payer: 59 | Admitting: Family Medicine

## 2022-09-23 ENCOUNTER — Inpatient Hospital Stay: Payer: 59 | Admitting: Hematology and Oncology

## 2022-09-23 ENCOUNTER — Encounter: Payer: Self-pay | Admitting: Hematology and Oncology

## 2022-09-23 VITALS — BP 158/91 | HR 82 | Temp 98.2°F | Resp 16 | Wt 231.2 lb

## 2022-09-23 DIAGNOSIS — C9000 Multiple myeloma not having achieved remission: Secondary | ICD-10-CM

## 2022-09-23 DIAGNOSIS — D472 Monoclonal gammopathy: Secondary | ICD-10-CM

## 2022-09-23 DIAGNOSIS — Z5112 Encounter for antineoplastic immunotherapy: Secondary | ICD-10-CM | POA: Diagnosis not present

## 2022-09-23 MED ORDER — PROCHLORPERAZINE MALEATE 10 MG PO TABS
10.0000 mg | ORAL_TABLET | Freq: Once | ORAL | Status: AC
  Administered 2022-09-23: 10 mg via ORAL
  Filled 2022-09-23: qty 1

## 2022-09-23 MED ORDER — BORTEZOMIB CHEMO SQ INJECTION 3.5 MG (2.5MG/ML)
1.3000 mg/m2 | Freq: Once | INTRAMUSCULAR | Status: AC
  Administered 2022-09-23: 3 mg via SUBCUTANEOUS
  Filled 2022-09-23: qty 1.2

## 2022-09-23 NOTE — Patient Instructions (Signed)
[facility]  Discharge Instructions: Thank you for choosing [facility] to provide your oncology and hematology care.   If you have a lab appointment with the Cancer Center, please go directly to the Cancer Center and check in at the registration area.   Wear comfortable clothing and clothing appropriate for easy access to any Portacath or PICC line.   We strive to give you quality time with your provider. You may need to reschedule your appointment if you arrive late (15 or more minutes).  Arriving late affects you and other patients whose appointments are after yours.  Also, if you miss three or more appointments without notifying the office, you may be dismissed from the clinic at the provider's discretion.      For prescription refill requests, have your pharmacy contact our office and allow 72 hours for refills to be completed.    Today you received the following chemotherapy and/or immunotherapy agents: Velcade     To help prevent nausea and vomiting after your treatment, we encourage you to take your nausea medication as directed.  BELOW ARE SYMPTOMS THAT SHOULD BE REPORTED IMMEDIATELY: *FEVER GREATER THAN 100.4 F (38 C) OR HIGHER *CHILLS OR SWEATING *NAUSEA AND VOMITING THAT IS NOT CONTROLLED WITH YOUR NAUSEA MEDICATION *UNUSUAL SHORTNESS OF BREATH *UNUSUAL BRUISING OR BLEEDING *URINARY PROBLEMS (pain or burning when urinating, or frequent urination) *BOWEL PROBLEMS (unusual diarrhea, constipation, pain near the anus) TENDERNESS IN MOUTH AND THROAT WITH OR WITHOUT PRESENCE OF ULCERS (sore throat, sores in mouth, or a toothache) UNUSUAL RASH, SWELLING OR PAIN  UNUSUAL VAGINAL DISCHARGE OR ITCHING   Items with * indicate a potential emergency and should be followed up as soon as possible or go to the Emergency Department if any problems should occur.  Please show the CHEMOTHERAPY ALERT CARD or IMMUNOTHERAPY ALERT CARD at check-in  to the Emergency Department and triage nurse.  Should you have questions after your visit or need to cancel or reschedule your appointment, please contact [facility]  Dept: [phone]  and follow the prompts.  Office hours are 8:00 a.m. to 4:30 p.m. Monday - Friday. Please note that voicemails left after 4:00 p.m. may not be returned until the following business day.  We are closed weekends and major holidays. You have access to a nurse at all times for urgent questions. Please call the main number to the clinic Dept: [phone] and follow the prompts.   For any non-urgent questions, you may also contact your provider using MyChart. We now offer e-Visits for anyone 18 and older to request care online for non-urgent symptoms. For details visit [email or web address].   Also download the MyChart app! Go to the app store, search "MyChart", open the app, select [facility], and log in with your MyChart username and password.   

## 2022-09-23 NOTE — Telephone Encounter (Signed)
-----   Message from Alvera Singh, RN sent at 09/20/2022  5:43 PM EST ----- Regarding: First time 09/20/22- patient tolerated her Darzalex well with no issues. Dr. Chryl Heck

## 2022-09-23 NOTE — Telephone Encounter (Signed)
Pt was seen in infusion room today for Velcade & [facility] assessment done therefore chemo f/u call was not done.

## 2022-09-24 ENCOUNTER — Inpatient Hospital Stay: Payer: 59

## 2022-09-24 ENCOUNTER — Ambulatory Visit: Payer: 59

## 2022-09-25 NOTE — Progress Notes (Signed)
[facility] CONSULT NOTE  Patient Care Team: Billie Ruddy, MD as PCP - General (Family Medicine) Werner Lean, MD as PCP - Cardiology (Cardiology) Servando Salina, MD as Consulting Physician (Obstetrics and Gynecology)  CHIEF COMPLAINTS/PURPOSE OF CONSULTATION:  IgG Kappa Multiple myeloma  ASSESSMENT & PLAN:   This is a very pleasant 47 year old female patient with a past medical history significant for gestational diabetes and trigeminal neuralgia corrected with surgery, hypertension referred to hematology for evaluation and recommendations regarding severe normocytic normochromic anemia.   We have reviewed her labs which showed severe anemia and elevated total protein.  SPEP showed 8.4 g/dL, IgG kappa.  Kappa lambda ratio at 110.89 No evidence of endorgan damage so far.  BMB confirmed Ig G kappa MM, bone survey pending scheduled for Wednesday. FISH for multiple myeloma showed a gain of chromosome 11, characterized as standard risk.  Cytogenetics showed normal female karyotype I reviewed pathology findings with Dr Gari Crown from pathology. We started her on daratumumab with Velcade and dexamethasone prior to her FISH results being available.  Now that she is deemed to be standard risk, we can consider doing VRD without daratumumab which is a category 1 preferred regimen for transplant candidates. We have also referred to [facility] transplant for further evaluation since she is a transplant candidate.  She started cycle 1 day 1 of Darzalex/Velcade with dexamethasone on 09/20/2022. Ok to proceed with daratumumab as planned today. From second cycle, we will transition to VRD since she is a standard risk. For pain, prescribed Tramadol 50 mg Q 12 PRN and flexeril 5 mg PO TID PRN. We will also start her on zometa given bone mets, no dental problems reported. 4 mg every 4 weeks.   I have discussed this in detail with the patient.  She is on board with all the  recommendations.  She understands that auto transplant is standard of care for multiple myeloma after induction chemotherapy    HISTORY OF PRESENTING ILLNESS:  Kara Medina 47 y.o. female is here because of severe anemia.  This is a very pleasant 47 yr old female patient with PMH of HTN referred to hematology for severe anemia.  Lab evaluation consistent with IgG kappa multiple myeloma.  Bone marrow biopsy also confirms suspected diagnosis of multiple myeloma.  Bone survey pending.  FISH standard risk, cytogenetics normal female karyotype  Started first cycle of daratumumab with Velcade on 09/20/2022  She has started some pain in the right sided chest cage.The pain is rated at 7/10. She said the pain has radiated to the front. Other than this pain, she has noted some injection site skin reaction with velcade. Otherwise she felt well.  MEDICAL HISTORY:  Past Medical History:  Diagnosis Date   Anemia    Gestational diabetes    diet controlled   Postpartum care following vaginal delivery (7/27) 02/09/2016   Pregnancy induced hypertension    Trigeminal neuralgia    2012 corrected with surgery    SURGICAL HISTORY: Past Surgical History:  Procedure Laterality Date   APPENDECTOMY  2008   TRIGEMINAL NERVE DECOMPRESSION  2012    SOCIAL HISTORY: Social History   Socioeconomic History   Marital status: Married    Spouse name: Not on file   Number of children: Not on file   Years of education: Not on file   Highest education level: Bachelor's degree (e.g., BA, AB, BS)  Occupational History   Not on file  Tobacco Use   Smoking status:  Never   Smokeless tobacco: Never  Vaping Use   Vaping Use: Never used  Substance and Sexual Activity   Alcohol use: No   Drug use: No   Sexual activity: Yes  Other Topics Concern   Not on file  Social History Narrative   Not on file   Social Determinants of Health   Financial Resource Strain: Low Risk  (08/23/2022)   Overall Financial  Resource Strain (CARDIA)    Difficulty of Paying Living Expenses: Not hard at all  Food Insecurity: No Food Insecurity (08/23/2022)   Hunger Vital Sign    Worried About Running Out of Food in the Last Year: Never true    Ran Out of Food in the Last Year: Never true  Transportation Needs: No Transportation Needs (08/23/2022)   PRAPARE - Hydrologist (Medical): No    Lack of Transportation (Non-Medical): No  Physical Activity: Insufficiently Active (08/23/2022)   Exercise Vital Sign    Days of Exercise per Week: 2 days    Minutes of Exercise per Session: 30 min  Stress: Stress Concern Present (08/23/2022)   [place]    Feeling of Stress : To some extent  Social Connections: Unknown (08/23/2022)   Social Connection and Isolation Panel [NHANES]    Frequency of Communication with Friends and Family: More than three times a week    Frequency of Social Gatherings with Friends and Family: Once a week    Attends Religious Services: Patient declined    Marine scientist or Organizations: Yes    Attends Music therapist: More than 4 times per year    Marital Status: Married  Human resources officer Violence: Not on file    FAMILY HISTORY: Family History  Problem Relation Age of Onset   Cancer Other    Hypertension Other    Healthy Father    Cancer Paternal Grandfather        stomach, lung   Hypertension Mother    Breast cancer Mother 54   Hypertension Maternal Grandmother    Arthritis Maternal Grandmother    Hypertension Maternal Grandfather    Other Brother        ?prediabetic   Diabetes Paternal Grandmother    Hypertension Paternal Grandmother    Autism Son     ALLERGIES:  is allergic to amoxicillin.  MEDICATIONS:  Current Outpatient Medications  Medication Sig Dispense Refill   acyclovir (ZOVIRAX) 400 MG tablet Take 1 tablet (400 mg total) by mouth 2 (two) times daily. 60  tablet 5   amLODipine (NORVASC) 5 MG tablet Take 1 tablet (5 mg total) by mouth daily. 90 tablet 3   Blood Pressure Monitoring (BLOOD PRESSURE CUFF) MISC 1 Device by Does not apply route daily. 1 each 0   dexamethasone (DECADRON) 4 MG tablet Take 5 tabs (20 mg) weekly the day after daratumumab for 12 weeks. Take with breakfast. 20 tablet 5   ibuprofen (ADVIL) 200 MG tablet Take 400 mg by mouth every 6 (six) hours as needed for headache or moderate pain.     lidocaine-prilocaine (EMLA) cream Apply to affected area once 30 g 3   metoprolol tartrate (LOPRESSOR) 25 MG tablet Take 1 tablet (25 mg total) by mouth 2 (two) times daily. 60 tablet 3   Multiple Vitamin (MULTIVITAMIN ADULT PO) Take by mouth.     ondansetron (ZOFRAN) 8 MG tablet Take 1 tablet (8 mg total) by mouth every 8 (  eight) hours as needed for nausea or vomiting. 30 tablet 1   prochlorperazine (COMPAZINE) 10 MG tablet Take 1 tablet (10 mg total) by mouth every 6 (six) hours as needed for nausea or vomiting. 30 tablet 1   No current facility-administered medications for this visit.     PHYSICAL EXAMINATION: ECOG PERFORMANCE STATUS: 0 - Asymptomatic  Vitals:   09/27/22 0833  BP: (!) 145/85  Pulse: (!) 102  Resp: 14  Temp: 97.9 F (36.6 C)  SpO2: 100%     Filed Weights   09/27/22 0833  Weight: 229 lb 11.2 oz (104.2 kg)     Physical Exam Constitutional:      Appearance: Normal appearance.  Cardiovascular:     Rate and Rhythm: Normal rate and regular rhythm.     Pulses: Normal pulses.     Heart sounds: Normal heart sounds.  Pulmonary:     Effort: Pulmonary effort is normal.     Breath sounds: Normal breath sounds.  Musculoskeletal:     Cervical back: Normal range of motion and neck supple. No rigidity.     Comments: Severe tenderness right rib cage below nipple area.  Lymphadenopathy:     Cervical: No cervical adenopathy.  Skin:    General: Skin is warm and dry.  Neurological:     General: No focal deficit  present.     Mental Status: She is alert.       LABORATORY DATA:  I have reviewed the data as listed Lab Results  Component Value Date   WBC 5.3 09/27/2022   HGB 9.4 (L) 09/27/2022   HCT 29.4 (L) 09/27/2022   MCV 93.0 09/27/2022   PLT 271 09/27/2022     Chemistry      Component Value Date/Time   NA 125 (L) 09/27/2022 0802   K 3.8 09/27/2022 0802   CL 102 09/27/2022 0802   CO2 25 09/27/2022 0802   BUN 19 09/27/2022 0802   CREATININE 0.81 09/27/2022 0802   CREATININE 0.95 08/23/2022 1631      Component Value Date/Time   CALCIUM 7.7 (L) 09/27/2022 0802   ALKPHOS 34 (L) 09/27/2022 0802   AST 18 09/27/2022 0802   ALT 23 09/27/2022 0802   BILITOT 0.2 (L) 09/27/2022 0802        RADIOGRAPHIC STUDIES: I have personally reviewed the radiological images as listed and agreed with the findings in the report. DG Bone Survey Met  Result Date: 09/18/2022 CLINICAL DATA:  Myeloma EXAM: METASTATIC BONE SURVEY COMPARISON:  None Available. FINDINGS: Skull: Single lateral view of the skull demonstrates multiple small lucent lesions along the skull. No displaced or depressed skull fracture on this lateral view. Clear paranasal sinuses. Surgical changes identified along the occipital region. Shoulders: Single view of each shoulder is without fracture or dislocation. Preserved bone mineralization. Slight degenerative changes of the right AC joint. There is slight asymmetry of the left AC joint with slight elevation of the distal clavicle. Please correlate for any previous injury. Humerus: Single view of each humerus is without fracture or dislocation. There is presence of some small lucent lesions subcortical along the shaft of the humeri, left-greater-than-right. Forearms: Single view each forearm is without fracture or dislocation. Preserved joint spaces and bone mineralization. No lucent or sclerotic bone lesion. Slight negative ulnar areas on the right. Cervical spine: Two views of the cervical  spine demonstrates preserved vertebral body height, disc height and alignment. Listhesis. Preserved prevertebral soft tissues. No lucent or sclerotic bone lesion. Chest x-ray:  Single view of the chest is without consolidation, pneumothorax or effusion. No edema. Normal cardiopericardial silhouette. Pelvis: Single view of the pelvis is without fracture or dislocation. Preserved joint spaces. There is focal area sclerosis along the iliac portion of the right sacroiliac joint inferiorly. There are some rounded densities in the pelvis which are indeterminate although possibly vascular. Overlapping colonic stool. Lucencies along the iliac bones could be related to overlapping stool versus true bony lesion. Femur: Single view of each femur is without fracture or dislocation. Preserved joint spaces and preserved adjacent joint spaces. There are 2 lucent lesions along the medullary space of the left femoral shaft. Tibia and fibula: Single view of each tibia and fibula is without fracture or dislocation. Preserved adjacent joint spaces and bone mineralization. No definite lucent or sclerotic bone lesion. Thoracic spine: Two views of the thoracic spine demonstrates dextroconvex curvature of the midthoracic spine. Preserved vertebral body height, disc height and alignment otherwise. Mild scattered endplate osteophytes. No definite lucent or sclerotic bone lesion of the thoracic spine. The extreme upper portion of the thoracic spine is not well seen on the lateral view. Lumbar spine: 5 lumbar-type vertebral bodies. Preserved vertebral body height. Slight levoconvex curvature of the mid lumbar spine. No listhesis. Mild endplate osteophytes greatest at T12-L1 where there are some disc height loss. No sclerotic bone lesions are identified. Questionable lucent lesion along the L2 and L3 levels versus overlapping bowel gas. IMPRESSION: Lucent lesions are identified along the skull, humeri, left femur. Questionable areas along the  lumbar spine and pelvis. Curvature of the spine with mild degenerative changes. Sclerotic lesion along the right iliac bone near the right SI joint may be a bone island. In retrospect this is present on the old CT scan of 05/03/2006. Asymmetric widening of the left AC joint. Please correlate for any history of injury Electronically Signed   By: Jill Side M.D.   On: 09/18/2022 10:10    All questions were answered. The patient knows to call the clinic with any problems, questions or concerns. I spent 40 minutes in the care of this patient including H and P, review of records, counseling and coordination of care.     Benay Pike, MD 09/27/2022 9:03 AM

## 2022-09-26 MED FILL — Dexamethasone Sodium Phosphate Inj 100 MG/10ML: INTRAMUSCULAR | Qty: 2 | Status: AC

## 2022-09-27 ENCOUNTER — Encounter: Payer: Self-pay | Admitting: Hematology and Oncology

## 2022-09-27 ENCOUNTER — Other Ambulatory Visit: Payer: Self-pay | Admitting: *Deleted

## 2022-09-27 ENCOUNTER — Inpatient Hospital Stay (HOSPITAL_BASED_OUTPATIENT_CLINIC_OR_DEPARTMENT_OTHER): Payer: 59 | Admitting: Hematology and Oncology

## 2022-09-27 ENCOUNTER — Other Ambulatory Visit: Payer: Self-pay | Admitting: Hematology and Oncology

## 2022-09-27 ENCOUNTER — Inpatient Hospital Stay: Payer: 59

## 2022-09-27 VITALS — BP 141/86 | HR 98 | Temp 98.5°F | Resp 18

## 2022-09-27 DIAGNOSIS — D472 Monoclonal gammopathy: Secondary | ICD-10-CM

## 2022-09-27 DIAGNOSIS — C9000 Multiple myeloma not having achieved remission: Secondary | ICD-10-CM

## 2022-09-27 DIAGNOSIS — Z5112 Encounter for antineoplastic immunotherapy: Secondary | ICD-10-CM | POA: Diagnosis not present

## 2022-09-27 LAB — CBC WITH DIFFERENTIAL (CANCER CENTER ONLY)
Abs Immature Granulocytes: 0.04 10*3/uL (ref 0.00–0.07)
Basophils Absolute: 0 10*3/uL (ref 0.0–0.1)
Basophils Relative: 0 %
Eosinophils Absolute: 0.1 10*3/uL (ref 0.0–0.5)
Eosinophils Relative: 2 %
HCT: 29.4 % — ABNORMAL LOW (ref 36.0–46.0)
Hemoglobin: 9.4 g/dL — ABNORMAL LOW (ref 12.0–15.0)
Immature Granulocytes: 1 %
Lymphocytes Relative: 41 %
Lymphs Abs: 2.2 10*3/uL (ref 0.7–4.0)
MCH: 29.7 pg (ref 26.0–34.0)
MCHC: 32 g/dL (ref 30.0–36.0)
MCV: 93 fL (ref 80.0–100.0)
Monocytes Absolute: 0.5 10*3/uL (ref 0.1–1.0)
Monocytes Relative: 10 %
Neutro Abs: 2.4 10*3/uL (ref 1.7–7.7)
Neutrophils Relative %: 46 %
Platelet Count: 271 10*3/uL (ref 150–400)
RBC: 3.16 MIL/uL — ABNORMAL LOW (ref 3.87–5.11)
RDW: 17.7 % — ABNORMAL HIGH (ref 11.5–15.5)
WBC Count: 5.3 10*3/uL (ref 4.0–10.5)
nRBC: 0 % (ref 0.0–0.2)

## 2022-09-27 LAB — CMP (CANCER CENTER ONLY)
ALT: 23 U/L (ref 0–44)
AST: 18 U/L (ref 15–41)
Albumin: 2.3 g/dL — ABNORMAL LOW (ref 3.5–5.0)
Alkaline Phosphatase: 34 U/L — ABNORMAL LOW (ref 38–126)
Anion gap: <0 — ABNORMAL LOW (ref 5–15)
BUN: 19 mg/dL (ref 6–20)
CO2: 25 mmol/L (ref 22–32)
Calcium: 7.7 mg/dL — ABNORMAL LOW (ref 8.9–10.3)
Chloride: 102 mmol/L (ref 98–111)
Creatinine: 0.81 mg/dL (ref 0.44–1.00)
GFR, Estimated: >60 mL/min (ref >60)
Glucose, Bld: 84 mg/dL (ref 70–99)
Potassium: 3.8 mmol/L (ref 3.5–5.1)
Sodium: 125 mmol/L — ABNORMAL LOW (ref 135–145)
Total Bilirubin: 0.2 mg/dL — ABNORMAL LOW (ref 0.3–1.2)
Total Protein: >12 g/dL — ABNORMAL HIGH (ref 6.5–8.1)

## 2022-09-27 MED ORDER — DIPHENHYDRAMINE HCL 25 MG PO CAPS
50.0000 mg | ORAL_CAPSULE | Freq: Once | ORAL | Status: AC
  Administered 2022-09-27: 50 mg via ORAL
  Filled 2022-09-27: qty 2

## 2022-09-27 MED ORDER — BORTEZOMIB CHEMO SQ INJECTION 3.5 MG (2.5MG/ML)
1.3000 mg/m2 | Freq: Once | INTRAMUSCULAR | Status: AC
  Administered 2022-09-27: 3 mg via SUBCUTANEOUS
  Filled 2022-09-27: qty 1.2

## 2022-09-27 MED ORDER — TRAMADOL HCL 50 MG PO TABS: 50.0000 mg | ORAL_TABLET | Freq: Two times a day (BID) | ORAL | 0 refills | Status: AC | PRN

## 2022-09-27 MED ORDER — MONTELUKAST SODIUM 10 MG PO TABS
10.0000 mg | ORAL_TABLET | Freq: Once | ORAL | Status: AC
  Administered 2022-09-27: 10 mg via ORAL
  Filled 2022-09-27: qty 1

## 2022-09-27 MED ORDER — ACETAMINOPHEN 325 MG PO TABS
650.0000 mg | ORAL_TABLET | Freq: Once | ORAL | Status: AC
  Administered 2022-09-27: 650 mg via ORAL
  Filled 2022-09-27: qty 2

## 2022-09-27 MED ORDER — SODIUM CHLORIDE 0.9 % IV SOLN
16.0000 mg/kg | Freq: Once | INTRAVENOUS | Status: AC
  Administered 2022-09-27: 1700 mg via INTRAVENOUS
  Filled 2022-09-27: qty 5

## 2022-09-27 MED ORDER — SODIUM CHLORIDE 0.9 % IV SOLN: Freq: Once | INTRAVENOUS | Status: AC

## 2022-09-27 MED ORDER — CYCLOBENZAPRINE HCL 5 MG PO TABS: 5.0000 mg | ORAL_TABLET | Freq: Three times a day (TID) | ORAL | 0 refills | Status: AC | PRN

## 2022-09-27 MED ORDER — SODIUM CHLORIDE 0.9 % IV SOLN
20.0000 mg | Freq: Once | INTRAVENOUS | Status: AC
  Administered 2022-09-27: 20 mg via INTRAVENOUS
  Filled 2022-09-27: qty 20

## 2022-09-27 NOTE — Patient Instructions (Signed)
[facility]  Discharge Instructions: Thank you for choosing [facility] to provide your oncology and hematology care.   If you have a lab appointment with the [facility], please go directly to the [facility] and check in at the registration area.   Wear comfortable clothing and clothing appropriate for easy access to any Portacath or PICC line.   We strive to give you quality time with your provider. You may need to reschedule your appointment if you arrive late (15 or more minutes).  Arriving late affects you and other patients whose appointments are after yours.  Also, if you miss three or more appointments without notifying the office, you may be dismissed from the clinic at the provider's discretion.      For prescription refill requests, have your pharmacy contact our office and allow 72 hours for refills to be completed.    Today you received the following chemotherapy and/or immunotherapy agents: Velcade & Darzalex       To help prevent nausea and vomiting after your treatment, we encourage you to take your nausea medication as directed.  BELOW ARE SYMPTOMS THAT SHOULD BE REPORTED IMMEDIATELY: *FEVER GREATER THAN 100.4 F (38 C) OR HIGHER *CHILLS OR SWEATING *NAUSEA AND VOMITING THAT IS NOT CONTROLLED WITH YOUR NAUSEA MEDICATION *UNUSUAL SHORTNESS OF BREATH *UNUSUAL BRUISING OR BLEEDING *URINARY PROBLEMS (pain or burning when urinating, or frequent urination) *BOWEL PROBLEMS (unusual diarrhea, constipation, pain near the anus) TENDERNESS IN MOUTH AND THROAT WITH OR WITHOUT PRESENCE OF ULCERS (sore throat, sores in mouth, or a toothache) UNUSUAL RASH, SWELLING OR PAIN  UNUSUAL VAGINAL DISCHARGE OR ITCHING   Items with * indicate a potential emergency and should be followed up as soon as possible or go to the Emergency Department if any problems should occur.  Please show the CHEMOTHERAPY ALERT CARD or IMMUNOTHERAPY ALERT  CARD at check-in to the Emergency Department and triage nurse.  Should you have questions after your visit or need to cancel or reschedule your appointment, please contact [facility]  Dept: [phone]  and follow the prompts.  Office hours are 8:00 a.m. to 4:30 p.m. Monday - Friday. Please note that voicemails left after 4:00 p.m. may not be returned until the following business day.  We are closed weekends and major holidays. You have access to a nurse at all times for urgent questions. Please call the main number to the clinic Dept: [phone] and follow the prompts.   For any non-urgent questions, you may also contact your provider using MyChart. We now offer e-Visits for anyone 49 and older to request care online for non-urgent symptoms. For details visit [email or web address].   Also download the MyChart app! Go to the app store, search "MyChart", open the app, select [facility], and log in with your MyChart username and password.

## 2022-09-30 ENCOUNTER — Inpatient Hospital Stay: Payer: 59

## 2022-09-30 VITALS — BP 142/95 | HR 79 | Temp 97.9°F | Resp 17

## 2022-09-30 DIAGNOSIS — C9000 Multiple myeloma not having achieved remission: Secondary | ICD-10-CM

## 2022-09-30 DIAGNOSIS — Z5112 Encounter for antineoplastic immunotherapy: Secondary | ICD-10-CM | POA: Diagnosis not present

## 2022-09-30 DIAGNOSIS — D472 Monoclonal gammopathy: Secondary | ICD-10-CM

## 2022-09-30 MED ORDER — PROCHLORPERAZINE MALEATE 10 MG PO TABS
10.0000 mg | ORAL_TABLET | Freq: Once | ORAL | Status: AC
  Administered 2022-09-30: 10 mg via ORAL
  Filled 2022-09-30: qty 1

## 2022-09-30 MED ORDER — BORTEZOMIB CHEMO SQ INJECTION 3.5 MG (2.5MG/ML)
1.3000 mg/m2 | Freq: Once | INTRAMUSCULAR | Status: AC
  Administered 2022-09-30: 3 mg via SUBCUTANEOUS
  Filled 2022-09-30: qty 1.2

## 2022-09-30 NOTE — Patient Instructions (Signed)
[facility]  Discharge Instructions: Thank you for choosing [facility] to provide your oncology and hematology care.   If you have a lab appointment with the Cancer Center, please go directly to the Cancer Center and check in at the registration area.   Wear comfortable clothing and clothing appropriate for easy access to any Portacath or PICC line.   We strive to give you quality time with your provider. You may need to reschedule your appointment if you arrive late (15 or more minutes).  Arriving late affects you and other patients whose appointments are after yours.  Also, if you miss three or more appointments without notifying the office, you may be dismissed from the clinic at the provider's discretion.      For prescription refill requests, have your pharmacy contact our office and allow 72 hours for refills to be completed.    Today you received the following chemotherapy and/or immunotherapy agents: Velcade     To help prevent nausea and vomiting after your treatment, we encourage you to take your nausea medication as directed.  BELOW ARE SYMPTOMS THAT SHOULD BE REPORTED IMMEDIATELY: *FEVER GREATER THAN 100.4 F (38 C) OR HIGHER *CHILLS OR SWEATING *NAUSEA AND VOMITING THAT IS NOT CONTROLLED WITH YOUR NAUSEA MEDICATION *UNUSUAL SHORTNESS OF BREATH *UNUSUAL BRUISING OR BLEEDING *URINARY PROBLEMS (pain or burning when urinating, or frequent urination) *BOWEL PROBLEMS (unusual diarrhea, constipation, pain near the anus) TENDERNESS IN MOUTH AND THROAT WITH OR WITHOUT PRESENCE OF ULCERS (sore throat, sores in mouth, or a toothache) UNUSUAL RASH, SWELLING OR PAIN  UNUSUAL VAGINAL DISCHARGE OR ITCHING   Items with * indicate a potential emergency and should be followed up as soon as possible or go to the Emergency Department if any problems should occur.  Please show the CHEMOTHERAPY ALERT CARD or IMMUNOTHERAPY ALERT CARD at check-in  to the Emergency Department and triage nurse.  Should you have questions after your visit or need to cancel or reschedule your appointment, please contact [facility]  Dept: [phone]  and follow the prompts.  Office hours are 8:00 a.m. to 4:30 p.m. Monday - Friday. Please note that voicemails left after 4:00 p.m. may not be returned until the following business day.  We are closed weekends and major holidays. You have access to a nurse at all times for urgent questions. Please call the main number to the clinic Dept: [phone] and follow the prompts.   For any non-urgent questions, you may also contact your provider using MyChart. We now offer e-Visits for anyone 18 and older to request care online for non-urgent symptoms. For details visit [email or web address].   Also download the MyChart app! Go to the app store, search "MyChart", open the app, select , and log in with your MyChart username and password.   

## 2022-09-30 NOTE — Progress Notes (Signed)
Patient stated that she had an episode of irregular heart rate after treatment on Friday so she took her evening dose of metoprolol and then started to feel better. She has an echo and follow up with her cardiologist coming up as well.

## 2022-10-01 ENCOUNTER — Ambulatory Visit: Payer: 59

## 2022-10-01 ENCOUNTER — Other Ambulatory Visit: Payer: 59

## 2022-10-01 ENCOUNTER — Ambulatory Visit: Payer: 59 | Admitting: Hematology and Oncology

## 2022-10-03 MED FILL — Dexamethasone Sodium Phosphate Inj 100 MG/10ML: INTRAMUSCULAR | Qty: 2 | Status: AC

## 2022-10-04 ENCOUNTER — Other Ambulatory Visit (HOSPITAL_COMMUNITY): Payer: Self-pay

## 2022-10-04 ENCOUNTER — Inpatient Hospital Stay: Payer: 59

## 2022-10-04 ENCOUNTER — Telehealth: Payer: Self-pay | Admitting: Pharmacy Technician

## 2022-10-04 ENCOUNTER — Inpatient Hospital Stay (HOSPITAL_BASED_OUTPATIENT_CLINIC_OR_DEPARTMENT_OTHER): Payer: 59 | Admitting: Hematology and Oncology

## 2022-10-04 ENCOUNTER — Telehealth: Payer: Self-pay | Admitting: *Deleted

## 2022-10-04 ENCOUNTER — Encounter: Payer: Self-pay | Admitting: Hematology and Oncology

## 2022-10-04 VITALS — BP 128/80 | HR 91 | Temp 97.6°F | Resp 16

## 2022-10-04 DIAGNOSIS — D472 Monoclonal gammopathy: Secondary | ICD-10-CM

## 2022-10-04 DIAGNOSIS — C9000 Multiple myeloma not having achieved remission: Secondary | ICD-10-CM

## 2022-10-04 DIAGNOSIS — D649 Anemia, unspecified: Secondary | ICD-10-CM

## 2022-10-04 DIAGNOSIS — Z5112 Encounter for antineoplastic immunotherapy: Secondary | ICD-10-CM | POA: Diagnosis not present

## 2022-10-04 LAB — CBC WITH DIFFERENTIAL (CANCER CENTER ONLY)
Abs Immature Granulocytes: 0.07 10*3/uL (ref 0.00–0.07)
Basophils Absolute: 0 10*3/uL (ref 0.0–0.1)
Basophils Relative: 0 %
Eosinophils Absolute: 0 10*3/uL (ref 0.0–0.5)
Eosinophils Relative: 0 %
HCT: 27.6 % — ABNORMAL LOW (ref 36.0–46.0)
Hemoglobin: 9 g/dL — ABNORMAL LOW (ref 12.0–15.0)
Immature Granulocytes: 1 %
Lymphocytes Relative: 30 %
Lymphs Abs: 2.2 10*3/uL (ref 0.7–4.0)
MCH: 30.5 pg (ref 26.0–34.0)
MCHC: 32.6 g/dL (ref 30.0–36.0)
MCV: 93.6 fL (ref 80.0–100.0)
Monocytes Absolute: 0.5 10*3/uL (ref 0.1–1.0)
Monocytes Relative: 7 %
Neutro Abs: 4.6 10*3/uL (ref 1.7–7.7)
Neutrophils Relative %: 62 %
Platelet Count: 225 10*3/uL (ref 150–400)
RBC: 2.95 MIL/uL — ABNORMAL LOW (ref 3.87–5.11)
RDW: 18.1 % — ABNORMAL HIGH (ref 11.5–15.5)
WBC Count: 7.4 10*3/uL (ref 4.0–10.5)
nRBC: 0.5 % — ABNORMAL HIGH (ref 0.0–0.2)

## 2022-10-04 LAB — COMPREHENSIVE METABOLIC PANEL
ALT: 19 U/L (ref 0–44)
AST: 15 U/L (ref 15–41)
Albumin: 2.4 g/dL — ABNORMAL LOW (ref 3.5–5.0)
Alkaline Phosphatase: 42 U/L (ref 38–126)
Anion gap: <0 — ABNORMAL LOW (ref 5–15)
BUN: 19 mg/dL (ref 6–20)
CO2: 26 mmol/L (ref 22–32)
Calcium: 7.5 mg/dL — ABNORMAL LOW (ref 8.9–10.3)
Chloride: 103 mmol/L (ref 98–111)
Creatinine, Ser: 0.81 mg/dL (ref 0.44–1.00)
GFR, Estimated: >60 mL/min (ref >60)
Glucose, Bld: 95 mg/dL (ref 70–99)
Potassium: 3.8 mmol/L (ref 3.5–5.1)
Sodium: 128 mmol/L — ABNORMAL LOW (ref 135–145)
Total Bilirubin: 0.2 mg/dL — ABNORMAL LOW (ref 0.3–1.2)
Total Protein: >12 g/dL — ABNORMAL HIGH (ref 6.5–8.1)

## 2022-10-04 MED ORDER — DIPHENHYDRAMINE HCL 25 MG PO CAPS
50.0000 mg | ORAL_CAPSULE | Freq: Once | ORAL | Status: AC
  Administered 2022-10-04: 50 mg via ORAL
  Filled 2022-10-04: qty 2

## 2022-10-04 MED ORDER — SODIUM CHLORIDE 0.9 % IV SOLN
16.0000 mg/kg | Freq: Once | INTRAVENOUS | Status: AC
  Administered 2022-10-04: 1700 mg via INTRAVENOUS
  Filled 2022-10-04: qty 5

## 2022-10-04 MED ORDER — SODIUM CHLORIDE 0.9 % IV SOLN: Freq: Once | INTRAVENOUS | Status: AC

## 2022-10-04 MED ORDER — LENALIDOMIDE 15 MG PO CAPS
15.0000 mg | ORAL_CAPSULE | Freq: Every day | ORAL | 5 refills | Status: DC
  Filled 2022-10-04: qty 14, 14d supply, fill #0

## 2022-10-04 MED ORDER — SODIUM CHLORIDE 0.9 % IV SOLN
20.0000 mg | Freq: Once | INTRAVENOUS | Status: AC
  Administered 2022-10-04: 20 mg via INTRAVENOUS
  Filled 2022-10-04: qty 20

## 2022-10-04 MED ORDER — ACETAMINOPHEN 325 MG PO TABS
650.0000 mg | ORAL_TABLET | Freq: Once | ORAL | Status: AC
  Administered 2022-10-04: 650 mg via ORAL
  Filled 2022-10-04: qty 2

## 2022-10-04 MED ORDER — MONTELUKAST SODIUM 10 MG PO TABS
10.0000 mg | ORAL_TABLET | Freq: Once | ORAL | Status: AC
  Administered 2022-10-04: 10 mg via ORAL
  Filled 2022-10-04: qty 1

## 2022-10-04 NOTE — Progress Notes (Signed)
[facility] CONSULT NOTE  Patient Care Team: Billie Ruddy, MD as PCP - General (Family Medicine) Werner Lean, MD as PCP - Cardiology (Cardiology) Servando Salina, MD as Consulting Physician (Obstetrics and Gynecology)  CHIEF COMPLAINTS/PURPOSE OF CONSULTATION:  IgG Kappa Multiple myeloma  ASSESSMENT & PLAN:   This is a very pleasant 47 year old female patient with a past medical history significant for gestational diabetes and trigeminal neuralgia corrected with surgery, hypertension referred to hematology for evaluation and recommendations regarding severe normocytic normochromic anemia.   We have reviewed her labs which showed severe anemia and elevated total protein.  SPEP showed 8.4 g/dL, IgG kappa.  Kappa lambda ratio at 110.89 No evidence of endorgan damage so far.   BMB confirmed Ig G kappa MM, bone survey pending scheduled for Wednesday. FISH for multiple myeloma showed a gain of chromosome 11, characterized as standard risk.  Cytogenetics showed normal female karyotype RISS-II, median survival 42 months.  We started her on daratumumab with Velcade and dexamethasone prior to her FISH results being available.  Now that she is deemed to be standard risk, we can consider doing VRD without daratumumab which is a category 1 preferred regimen for transplant candidates. We have also referred to [facility] transplant for further evaluation since she is a transplant candidate.  She started cycle 1 day 1 of Darzalex/Velcade with dexamethasone on 09/20/2022. We received a fax confirmation that the referral was indeed received however upon following up today, we were told they didn't receive this, hence we faxed it again.  Ok to proceed with daratumumab as planned today.  From second cycle, we will transition to VRD since she is a standard risk. Revelimid prescription sent. For pain, prescribed Tramadol 50 mg Q 12 PRN and flexeril 5 mg PO TID PRN. We will also  start her on zometa given bone mets, no dental problems reported.Dental clearance handed over to the patient as well. She understands the risk of hypocalcemia and osteonecrosis of the jaw with Zometa. She is agreeable to starting it asap.  C2D1 starts 3/29, but we can't advance to the future plan without completing daratumumab today. Revelimid, will start at 15 and will increase dose as tolerated.  I have discussed this in detail with the patient.  She is on board with all the recommendations.  She understands that auto transplant is standard of care for multiple myeloma after induction chemotherapy    HISTORY OF PRESENTING ILLNESS:  Kara Medina 47 y.o. female is here because of severe anemia.  This is a very pleasant 47 yr old female patient with PMH of HTN referred to hematology for severe anemia.  Lab evaluation consistent with IgG kappa multiple myeloma.  Bone marrow biopsy also confirms suspected diagnosis of multiple myeloma.  Bone survey pending.  FISH standard risk, cytogenetics normal female karyotype  Started first cycle of daratumumab with Velcade on 09/20/2022 Since her last visit here, her right-sided rib pain continues but she is reluctant to take any tramadol or Flexeril.  She also had several good questions today about the regimen, role of bisphosphonates.  She has noticed some change in taste, some palpitations intermittently and mild nausea with above-mentioned regimen.  Rest of the pertinent 10 point ROS reviewed and negative   MEDICAL HISTORY:  Past Medical History:  Diagnosis Date   Anemia    Gestational diabetes    diet controlled   Postpartum care following vaginal delivery (7/27) 02/09/2016   Pregnancy induced hypertension  Trigeminal neuralgia    2012 corrected with surgery    SURGICAL HISTORY: Past Surgical History:  Procedure Laterality Date   APPENDECTOMY  2008   TRIGEMINAL NERVE DECOMPRESSION  2012    SOCIAL HISTORY: Social History    Socioeconomic History   Marital status: Married    Spouse name: Not on file   Number of children: Not on file   Years of education: Not on file   Highest education level: Bachelor's degree (e.g., BA, AB, BS)  Occupational History   Not on file  Tobacco Use   Smoking status: Never   Smokeless tobacco: Never  Vaping Use   Vaping Use: Never used  Substance and Sexual Activity   Alcohol use: No   Drug use: No   Sexual activity: Yes  Other Topics Concern   Not on file  Social History Narrative   Not on file   Social Determinants of Health   Financial Resource Strain: Low Risk  (08/23/2022)   Overall Financial Resource Strain (CARDIA)    Difficulty of Paying Living Expenses: Not hard at all  Food Insecurity: No Food Insecurity (08/23/2022)   Hunger Vital Sign    Worried About Running Out of Food in the Last Year: Never true    Ran Out of Food in the Last Year: Never true  Transportation Needs: No Transportation Needs (08/23/2022)   PRAPARE - Hydrologist (Medical): No    Lack of Transportation (Non-Medical): No  Physical Activity: Insufficiently Active (08/23/2022)   Exercise Vital Sign    Days of Exercise per Week: 2 days    Minutes of Exercise per Session: 30 min  Stress: Stress Concern Present (08/23/2022)   [place]    Feeling of Stress : To some extent  Social Connections: Unknown (08/23/2022)   Social Connection and Isolation Panel [NHANES]    Frequency of Communication with Friends and Family: More than three times a week    Frequency of Social Gatherings with Friends and Family: Once a week    Attends Religious Services: Patient declined    Marine scientist or Organizations: Yes    Attends Music therapist: More than 4 times per year    Marital Status: Married  Human resources officer Violence: Not on file    FAMILY HISTORY: Family History  Problem Relation Age  of Onset   Cancer Other    Hypertension Other    Healthy Father    Cancer Paternal Grandfather        stomach, lung   Hypertension Mother    Breast cancer Mother 78   Hypertension Maternal Grandmother    Arthritis Maternal Grandmother    Hypertension Maternal Grandfather    Other Brother        ?prediabetic   Diabetes Paternal Grandmother    Hypertension Paternal Grandmother    Autism Son     ALLERGIES:  is allergic to amoxicillin.  MEDICATIONS:  Current Outpatient Medications  Medication Sig Dispense Refill   lenalidomide (REVLIMID) 15 MG capsule Take 1 capsule (15 mg total) by mouth daily. [employer] Auth # [id number]    Date Obtained 10/04/2022 14 capsule 5   acyclovir (ZOVIRAX) 400 MG tablet Take 1 tablet (400 mg total) by mouth 2 (two) times daily. 60 tablet 5   amLODipine (NORVASC) 5 MG tablet Take 1 tablet (5 mg total) by mouth daily. 90 tablet 3   Blood Pressure Monitoring (BLOOD PRESSURE CUFF)  MISC 1 Device by Does not apply route daily. 1 each 0   cyclobenzaprine (FLEXERIL) 5 MG tablet Take 1 tablet (5 mg total) by mouth 3 (three) times daily as needed for muscle spasms. 30 tablet 0   dexamethasone (DECADRON) 4 MG tablet Take 5 tabs (20 mg) weekly the day after daratumumab for 12 weeks. Take with breakfast. 20 tablet 5   ibuprofen (ADVIL) 200 MG tablet Take 400 mg by mouth every 6 (six) hours as needed for headache or moderate pain.     lidocaine-prilocaine (EMLA) cream Apply to affected area once 30 g 3   metoprolol tartrate (LOPRESSOR) 25 MG tablet Take 1 tablet (25 mg total) by mouth 2 (two) times daily. 60 tablet 3   Multiple Vitamin (MULTIVITAMIN ADULT PO) Take by mouth.     ondansetron (ZOFRAN) 8 MG tablet Take 1 tablet (8 mg total) by mouth every 8 (eight) hours as needed for nausea or vomiting. 30 tablet 1   prochlorperazine (COMPAZINE) 10 MG tablet Take 1 tablet (10 mg total) by mouth every 6 (six) hours as needed for nausea or vomiting. 30 tablet 1   traMADol (ULTRAM)  50 MG tablet Take 1 tablet (50 mg total) by mouth every 12 (twelve) hours as needed. 30 tablet 0   No current facility-administered medications for this visit.   Facility-Administered Medications Ordered in Other Visits  Medication Dose Route Frequency Provider Last Rate Last Admin   0.9 %  sodium chloride infusion   Intravenous Once Quenisha Lovins, MD       acetaminophen (TYLENOL) tablet 650 mg  650 mg Oral Once Deandrae Wajda, Arletha Pili, MD       daratumumab (DARZALEX) 1,700 mg in sodium chloride 0.9 % 415 mL (3.4 mg/mL) chemo infusion  16 mg/kg (Treatment Plan Recorded) Intravenous Once Demoni Gergen, Arletha Pili, MD       dexamethasone (DECADRON) 20 mg in sodium chloride 0.9 % 50 mL IVPB  20 mg Intravenous Once Kandon Hosking, MD       diphenhydrAMINE (BENADRYL) capsule 50 mg  50 mg Oral Once Alysabeth Scalia, MD       montelukast (SINGULAIR) tablet 10 mg  10 mg Oral Once Claire Dolores, Arletha Pili, MD         PHYSICAL EXAMINATION: ECOG PERFORMANCE STATUS: 0 - Asymptomatic  Vitals:   10/04/22 1121  BP: (!) 142/83  Pulse: 100  Resp: 16  Temp: 98.1 F (36.7 C)  SpO2: 100%     Filed Weights   10/04/22 1121  Weight: 227 lb 3.2 oz (103.1 kg)     Physical Exam Constitutional:      Appearance: Normal appearance.  Cardiovascular:     Rate and Rhythm: Normal rate and regular rhythm.     Pulses: Normal pulses.     Heart sounds: Normal heart sounds.  Pulmonary:     Effort: Pulmonary effort is normal.     Breath sounds: Normal breath sounds.  Musculoskeletal:     Cervical back: Normal range of motion and neck supple. No rigidity.  Lymphadenopathy:     Cervical: No cervical adenopathy.  Skin:    General: Skin is warm and dry.  Neurological:     General: No focal deficit present.     Mental Status: She is alert.       LABORATORY DATA:  I have reviewed the data as listed Lab Results  Component Value Date   WBC 7.4 10/04/2022   HGB 9.0 (L) 10/04/2022   HCT 27.6 (L) 10/04/2022   MCV  93.6  10/04/2022   PLT 225 10/04/2022     Chemistry      Component Value Date/Time   NA 128 (L) 10/04/2022 1037   K 3.8 10/04/2022 1037   CL 103 10/04/2022 1037   CO2 26 10/04/2022 1037   BUN 19 10/04/2022 1037   CREATININE 0.81 10/04/2022 1037   CREATININE 0.81 09/27/2022 0802   CREATININE 0.95 08/23/2022 1631      Component Value Date/Time   CALCIUM 7.5 (L) 10/04/2022 1037   ALKPHOS 42 10/04/2022 1037   AST 15 10/04/2022 1037   AST 18 09/27/2022 0802   ALT 19 10/04/2022 1037   ALT 23 09/27/2022 0802   BILITOT 0.2 (L) 10/04/2022 1037   BILITOT 0.2 (L) 09/27/2022 0802        RADIOGRAPHIC STUDIES: I have personally reviewed the radiological images as listed and agreed with the findings in the report. DG Bone Survey Met  Result Date: 09/18/2022 CLINICAL DATA:  Myeloma EXAM: METASTATIC BONE SURVEY COMPARISON:  None Available. FINDINGS: Skull: Single lateral view of the skull demonstrates multiple small lucent lesions along the skull. No displaced or depressed skull fracture on this lateral view. Clear paranasal sinuses. Surgical changes identified along the occipital region. Shoulders: Single view of each shoulder is without fracture or dislocation. Preserved bone mineralization. Slight degenerative changes of the right AC joint. There is slight asymmetry of the left AC joint with slight elevation of the distal clavicle. Please correlate for any previous injury. Humerus: Single view of each humerus is without fracture or dislocation. There is presence of some small lucent lesions subcortical along the shaft of the humeri, left-greater-than-right. Forearms: Single view each forearm is without fracture or dislocation. Preserved joint spaces and bone mineralization. No lucent or sclerotic bone lesion. Slight negative ulnar areas on the right. Cervical spine: Two views of the cervical spine demonstrates preserved vertebral body height, disc height and alignment. Listhesis. Preserved prevertebral  soft tissues. No lucent or sclerotic bone lesion. Chest x-ray: Single view of the chest is without consolidation, pneumothorax or effusion. No edema. Normal cardiopericardial silhouette. Pelvis: Single view of the pelvis is without fracture or dislocation. Preserved joint spaces. There is focal area sclerosis along the iliac portion of the right sacroiliac joint inferiorly. There are some rounded densities in the pelvis which are indeterminate although possibly vascular. Overlapping colonic stool. Lucencies along the iliac bones could be related to overlapping stool versus true bony lesion. Femur: Single view of each femur is without fracture or dislocation. Preserved joint spaces and preserved adjacent joint spaces. There are 2 lucent lesions along the medullary space of the left femoral shaft. Tibia and fibula: Single view of each tibia and fibula is without fracture or dislocation. Preserved adjacent joint spaces and bone mineralization. No definite lucent or sclerotic bone lesion. Thoracic spine: Two views of the thoracic spine demonstrates dextroconvex curvature of the midthoracic spine. Preserved vertebral body height, disc height and alignment otherwise. Mild scattered endplate osteophytes. No definite lucent or sclerotic bone lesion of the thoracic spine. The extreme upper portion of the thoracic spine is not well seen on the lateral view. Lumbar spine: 5 lumbar-type vertebral bodies. Preserved vertebral body height. Slight levoconvex curvature of the mid lumbar spine. No listhesis. Mild endplate osteophytes greatest at T12-L1 where there are some disc height loss. No sclerotic bone lesions are identified. Questionable lucent lesion along the L2 and L3 levels versus overlapping bowel gas. IMPRESSION: Lucent lesions are identified along the skull, humeri, left femur. Questionable  areas along the lumbar spine and pelvis. Curvature of the spine with mild degenerative changes. Sclerotic lesion along the right  iliac bone near the right SI joint may be a bone island. In retrospect this is present on the old CT scan of 05/03/2006. Asymmetric widening of the left AC joint. Please correlate for any history of injury Electronically Signed   By: Jill Side M.D.   On: 09/18/2022 10:10    All questions were answered. The patient knows to call the clinic with any problems, questions or concerns. I spent 40 minutes in the care of this patient including H and P, review of records, counseling and coordination of care.     Benay Pike, MD 10/04/2022 12:50 PM

## 2022-10-04 NOTE — Telephone Encounter (Signed)
Oral Oncology Patient Advocate Encounter  Prior Authorization for lenalidomide has been approved.    PA# [id number] Effective dates: 10/04/22 through 10/04/23  [facility] must fill [facility]  [facility] phone [place], [place] [facility] [place] Direct Number: [phone]  Fax: [phone]

## 2022-10-04 NOTE — Telephone Encounter (Signed)
Oral Oncology Patient Advocate Encounter   Received notification that prior authorization for lenalidomide is required.   PA submitted on 10/04/22 Key [id number] Status is pending     Lady Deutscher, CPhT-Adv Oncology Pharmacy Patient [place] Direct Number: [phone]  Fax: [phone]

## 2022-10-04 NOTE — Telephone Encounter (Signed)
This RN contacted hematology office at [facility] per referral faxed with confirmation on 09/27/2022 - spoke with Amy who could not locate or see referral. Fax number verified- with Amy requesting fax to be resent with same number as well as second number.  Amy will call this RN for confirmation of receiving.

## 2022-10-06 LAB — IGG, IGA, IGM
IgA: 69 mg/dL — ABNORMAL LOW (ref 87–352)
IgG (Immunoglobin G), Serum: 7122 mg/dL — ABNORMAL HIGH (ref 586–1602)
IgM (Immunoglobulin M), Srm: 16 mg/dL — ABNORMAL LOW (ref 26–217)

## 2022-10-07 ENCOUNTER — Telehealth: Payer: Self-pay | Admitting: Hematology and Oncology

## 2022-10-07 ENCOUNTER — Other Ambulatory Visit: Payer: Self-pay | Admitting: Hematology and Oncology

## 2022-10-07 ENCOUNTER — Telehealth: Payer: Self-pay

## 2022-10-07 DIAGNOSIS — C9000 Multiple myeloma not having achieved remission: Secondary | ICD-10-CM

## 2022-10-07 DIAGNOSIS — D472 Monoclonal gammopathy: Secondary | ICD-10-CM

## 2022-10-07 LAB — KAPPA/LAMBDA LIGHT CHAINS
Kappa free light chain: 526.4 mg/L — ABNORMAL HIGH (ref 3.3–19.4)
Kappa, lambda light chain ratio: 164.5 — ABNORMAL HIGH (ref 0.26–1.65)
Lambda free light chains: 3.2 mg/L — ABNORMAL LOW (ref 5.7–26.3)

## 2022-10-07 MED ORDER — DEXAMETHASONE 4 MG PO TABS: 20.0000 mg | ORAL_TABLET | ORAL | 3 refills | Status: DC

## 2022-10-07 MED ORDER — LENALIDOMIDE 15 MG PO CAPS: 15.0000 mg | ORAL_CAPSULE | Freq: Every day | ORAL | 0 refills | Status: DC

## 2022-10-07 MED ORDER — LIDOCAINE-PRILOCAINE 2.5-2.5 % EX CREA: TOPICAL_CREAM | CUTANEOUS | 3 refills | Status: DC

## 2022-10-07 NOTE — Telephone Encounter (Signed)
Patient aware of upcoming appointments  

## 2022-10-07 NOTE — Telephone Encounter (Addendum)
Oral Oncology Student Pharmacist Encounter  Received new prescription for Revlimid for the treatment of Multiple myeloma in conjunction with dexamethasone and Velcade, planned duration until disease progression or unacceptable toxicity.  Labs from 10/04/2022 assessed, no interventions needed.  Pt's calculated CrCL is 141 mL/min.  Prescription dose and frequency assessed for appropriateness.  Current medication list in Epic reviewed, no DDIs with Revlimid identified.  Evaluated chart and no patient barriers to medication adherence noted.   Patient agreement for treatment documented in MD note on 10/04/2022.  Prescription has been e-scribed to the [facility] for benefits analysis and approval.  Oral Oncology Clinic will continue to follow for insurance authorization, copayment issues, initial counseling and start date.  Donney Rankins, PharmD Candidate 10/07/2022 8:57 AM Oral Oncology Clinic [phone]

## 2022-10-08 ENCOUNTER — Ambulatory Visit: Payer: 59 | Attending: Cardiology | Admitting: Pharmacist

## 2022-10-08 ENCOUNTER — Ambulatory Visit (HOSPITAL_BASED_OUTPATIENT_CLINIC_OR_DEPARTMENT_OTHER): Payer: 59

## 2022-10-08 VITALS — BP 138/82 | HR 89

## 2022-10-08 DIAGNOSIS — I160 Hypertensive urgency: Secondary | ICD-10-CM | POA: Diagnosis not present

## 2022-10-08 DIAGNOSIS — D509 Iron deficiency anemia, unspecified: Secondary | ICD-10-CM | POA: Diagnosis not present

## 2022-10-08 DIAGNOSIS — I517 Cardiomegaly: Secondary | ICD-10-CM | POA: Insufficient documentation

## 2022-10-08 MED ORDER — PERFLUTREN LIPID MICROSPHERE
1.0000 mL | INTRAVENOUS | Status: AC | PRN
  Administered 2022-10-08: 2 mL via INTRAVENOUS

## 2022-10-08 MED ORDER — LABETALOL HCL 200 MG PO TABS: 200.0000 mg | ORAL_TABLET | Freq: Two times a day (BID) | ORAL | 3 refills | Status: DC

## 2022-10-08 NOTE — Progress Notes (Signed)
Patient ID: TARRIN LECKNER                 DOB: [birth year]                      MRN: [id number]      HPI: Kara Medina is a 47 y.o. female referred by Dr. Gasper Sells to HTN clinic. PMH is significant for HTN, gestational diabetes, anemia, multiple myeloma (Feb 2024)  At visit with Dr. Gasper Sells on 08/29/2022 patient reported not feeling well, having palpitations with light activity, fatigue. These symptoms had been occurring since Dec of 2023. Amlodipine 5mg  was added. New diagnosis of multiple myeloma on 09/12/2022.  At today's visit, patient reports adherence to amlodipine and metoprolol tartrate. Denies headaches, dizziness, lightheadedness. Patient reports BP readings have looked better since starting amlodipine. Home BP average ~140/80s with some readings <130/80. Patient reports taking her BP twice daily (morning and evening). Patient reports making changes to her diet including increasing water intake and cutting back on fast food. Patient reports she is still having nosebleeds frequently, tries to journal to keep track of them. When increasing exercise (walking fast) she feels her HR is high- having palpitations. Home HR 80- low 100s. Patient reported when she had a blood transfusion her HR felt better. Patient inquired about what medications are safe to use in HTN and how to optimize lifestyle to best help her BP.   Current HTN meds: amlodipine 5mg , metoprolol tartrate 25 mg BID Previously tried:  BP goal: < 130/80  Family History: The patient's family history includes Arthritis in her maternal grandmother; Autism in her son; Breast cancer (age of onset: 51) in her mother; Cancer in her paternal grandfather and another family member; Diabetes in her paternal grandmother; Healthy in her father; Hypertension in her maternal grandfather, maternal grandmother, mother, paternal grandmother, and another family member; Other in her brother.   Social History: Never smoked, no alcohol use,  no drug use.   Diet:  Water intake increased, cut back on fast food, eating more vegetables/greens  Protein: chicken, ground beef  Snack: almonds, cashews, apples   Exercise:  Very little. ~30 min walking slowly. Her HR makes her stop or slow down.    Home BP readings:  140s/80s, some < 130/80 > 78 HR, 80- low 100s    Wt Readings from Last 3 Encounters:  10/04/22 227 lb 3.2 oz (103.1 kg)  09/27/22 229 lb 11.2 oz (104.2 kg)  09/23/22 231 lb 4 oz (104.9 kg)   BP Readings from Last 3 Encounters:  10/08/22 138/82  10/04/22 128/80  10/04/22 (!) 142/83   Pulse Readings from Last 3 Encounters:  10/08/22 89  10/04/22 91  10/04/22 100    Renal function: Estimated Creatinine Clearance: 105.2 mL/min (by C-G formula based on SCr of 0.81 mg/dL).  Past Medical History:  Diagnosis Date   Anemia    Gestational diabetes    diet controlled   Postpartum care following vaginal delivery (7/27) 02/09/2016   Pregnancy induced hypertension    Trigeminal neuralgia    2012 corrected with surgery    Current Outpatient Medications on File Prior to Visit  Medication Sig Dispense Refill   amLODipine (NORVASC) 5 MG tablet Take 1 tablet (5 mg total) by mouth daily. 90 tablet 3   Blood Pressure Monitoring (BLOOD PRESSURE CUFF) MISC 1 Device by Does not apply route daily. 1 each 0   cyclobenzaprine (FLEXERIL) 5 MG tablet Take 1 tablet (5 mg  total) by mouth 3 (three) times daily as needed for muscle spasms. 30 tablet 0   dexamethasone (DECADRON) 4 MG tablet Take 5 tablets (20 mg total) by mouth once a week. 40 tablet 3   ibuprofen (ADVIL) 200 MG tablet Take 400 mg by mouth every 6 (six) hours as needed for headache or moderate pain.     lenalidomide (REVLIMID) 15 MG capsule Take 1 capsule (15 mg total) by mouth daily. Take for 14 days, hold for 7 days. Repeat every 21 days. 14 capsule 0   lidocaine-prilocaine (EMLA) cream Apply to affected area once 30 g 3   Multiple Vitamin (MULTIVITAMIN ADULT  PO) Take by mouth.     traMADol (ULTRAM) 50 MG tablet Take 1 tablet (50 mg total) by mouth every 12 (twelve) hours as needed. 30 tablet 0   No current facility-administered medications on file prior to visit.    Allergies  Allergen Reactions   Amoxicillin Hives    Has patient had a PCN reaction causing immediate rash, facial/tongue/throat swelling, SOB or lightheadedness with hypotension: Yes Has patient had a PCN reaction causing severe rash involving mucus membranes or skin necrosis: No Has patient had a PCN reaction that required hospitalization No Has patient had a PCN reaction occurring within the last 10 years: No If all of the above answers are "NO", then may proceed with Cephalosporin use.     Blood pressure 138/82, pulse 89, SpO2 96 %.   Assessment/Plan:  1. Hypertension -  Hypertensive urgency Assessment:  Patient reports adherence to amlodipine 5mg  and metoprolol tartrate 25mg  BID  Home BP average 140/80s with some readings <130/80, HR 80- low 100s BP in clinic: 138/82 HR 89 Patient reports making significant changes to diet including decreasing fast food and drinking more water Patient reports walking for exercise but when she increases pace, she feels her HR very high  Patient reports still having frequent nosebleeds. Discussed methods to increase moisture for nostrils - saline spray, gels, humidifier.  Discussed what medications are safe to use for allergies in HTN - avoiding NSAIDs, pseudoephedrine, and phenylephrine   Plan:  Start taking labetalol 200 mg BID for BP and HR lowering.  Stop taking metoprolol tartrate 25mg  BID  Continue taking amlodipine 5mg  daily  Encouraged patient to continue exercise and changes to diet - avoiding foods high in sodium/salt.  Follow-up Thursday, May 2nd at 8:30 AM   Thank you,  Candiss Norse PharmD Candidate Class of 2024  [place], [place].D, BCPS, CPP [facility]  Division of [facility] [address] [place], [place] [address]  Phone: [phone]; Fax: [phone]

## 2022-10-08 NOTE — Assessment & Plan Note (Addendum)
Assessment:  Patient reports adherence to amlodipine 5mg  and metoprolol tartrate 25mg  BID  Home BP average 140/80s with some readings <130/80, HR 80- low 100s BP in clinic: 138/82 HR 89 Patient reports making significant changes to diet including decreasing fast food and drinking more water Patient reports walking for exercise but when she increases pace, she feels her HR very high  Patient reports still having frequent nosebleeds. Discussed methods to increase moisture for nostrils - saline spray, gels, humidifier.  Discussed what medications are safe to use for allergies in HTN - avoiding NSAIDs, pseudoephedrine, and phenylephrine   Plan:  Start taking labetalol 200 mg BID for BP and HR lowering.  Stop taking metoprolol tartrate 25mg  BID  Continue taking amlodipine 5mg  daily  Encouraged patient to continue exercise and changes to diet - avoiding foods high in sodium/salt.  Follow-up Thursday, May 2nd at 8:30 AM

## 2022-10-08 NOTE — Patient Instructions (Addendum)
STOP metoprolol START labetalol 200mg  twice a day Continue amlodipine 5mg  daily  Continue checking blood pressure daily Please call me at [phone] with any questions  Summary of today's discussion  Your blood pressure goal is <130/80  To check your pressure at home you will need to:  1. Sit up in a chair, with feet flat on the floor and back supported. Do not cross your ankles or legs. 2. Rest your left arm so that the cuff is about heart level. If the cuff goes on your upper arm,  then just relax the arm on the table, arm of the chair or your lap. If you have a wrist cuff, we  suggest relaxing your wrist against your chest (think of it as Pledging the Flag with the  wrong arm).  3. Place the cuff snugly around your arm, about 1 inch above the crook of your elbow. The  cords should be inside the groove of your elbow.  4. Sit quietly, with the cuff in place, for about 5 minutes. After that 5 minutes press the power  button to start a reading. 5. Do not talk or move while the reading is taking place.  6. Record your readings on a sheet of paper. Although most cuffs have a memory, it is often  easier to see a pattern developing when the numbers are all in front of you.  7. You can repeat the reading after 1-3 minutes if it is recommended  Make sure your bladder is empty and you have not had caffeine or tobacco within the last 30 min  Always bring your blood pressure log with you to your appointments. If you have not brought your monitor in to be double checked for accuracy, please bring it to your next appointment.  You can find a list of validated (accurate) blood pressure cuffs at [email or web address][email or web address]   Important lifestyle changes to control high blood pressure  Intervention  Effect on the BP  Lose extra pounds and watch your waistline Weight loss is one of the most effective lifestyle changes for controlling blood pressure. If you're overweight or obese, losing even a small amount  of weight can help reduce blood pressure. Blood pressure might go down by about 1 millimeter of mercury (mm Hg) with each kilogram (about 2.2 pounds) of weight lost.  Exercise regularly As a general goal, aim for at least 30 minutes of moderate physical activity every day. Regular physical activity can lower high blood pressure by about 5 to 8 mm Hg.  Eat a healthy diet Eating a diet rich in whole grains, fruits, vegetables, and low-fat dairy products and low in saturated fat and cholesterol. A healthy diet can lower high blood pressure by up to 11 mm Hg.  Reduce salt (sodium) in your diet Even a small reduction of sodium in the diet can improve heart health and reduce high blood pressure by about 5 to 6 mm Hg.  Limit alcohol One drink equals 12 ounces of beer, 5 ounces of wine, or 1.5 ounces of 80-proof liquor.  Limiting alcohol to less than one drink a day for women or two drinks a day for men can help lower blood pressure by about 4 mm Hg.   Please call me at [phone] with any questions.

## 2022-10-09 LAB — ECHOCARDIOGRAM COMPLETE: S' Lateral: 2.6 cm

## 2022-10-11 ENCOUNTER — Inpatient Hospital Stay: Payer: 59

## 2022-10-11 VITALS — BP 143/83 | HR 98 | Temp 98.2°F | Resp 18 | Wt 227.5 lb

## 2022-10-11 DIAGNOSIS — Z5112 Encounter for antineoplastic immunotherapy: Secondary | ICD-10-CM | POA: Diagnosis not present

## 2022-10-11 DIAGNOSIS — C9000 Multiple myeloma not having achieved remission: Secondary | ICD-10-CM

## 2022-10-11 DIAGNOSIS — D472 Monoclonal gammopathy: Secondary | ICD-10-CM

## 2022-10-11 LAB — CMP (CANCER CENTER ONLY)
ALT: 14 U/L (ref 0–44)
AST: 12 U/L — ABNORMAL LOW (ref 15–41)
Albumin: 2.4 g/dL — ABNORMAL LOW (ref 3.5–5.0)
Alkaline Phosphatase: 51 U/L (ref 38–126)
Anion gap: <0 — ABNORMAL LOW (ref 5–15)
BUN: 15 mg/dL (ref 6–20)
CO2: 27 mmol/L (ref 22–32)
Calcium: 7.5 mg/dL — ABNORMAL LOW (ref 8.9–10.3)
Chloride: 103 mmol/L (ref 98–111)
Creatinine: 0.71 mg/dL (ref 0.44–1.00)
GFR, Estimated: >60 mL/min (ref >60)
Glucose, Bld: 97 mg/dL (ref 70–99)
Potassium: 3.7 mmol/L (ref 3.5–5.1)
Sodium: 128 mmol/L — ABNORMAL LOW (ref 135–145)
Total Bilirubin: 0.2 mg/dL — ABNORMAL LOW (ref 0.3–1.2)
Total Protein: >12 g/dL — ABNORMAL HIGH (ref 6.5–8.1)

## 2022-10-11 LAB — CBC WITH DIFFERENTIAL (CANCER CENTER ONLY)
Abs Immature Granulocytes: 0.02 10*3/uL (ref 0.00–0.07)
Basophils Absolute: 0 10*3/uL (ref 0.0–0.1)
Basophils Relative: 0 %
Eosinophils Absolute: 0 10*3/uL (ref 0.0–0.5)
Eosinophils Relative: 1 %
HCT: 27.5 % — ABNORMAL LOW (ref 36.0–46.0)
Hemoglobin: 8.8 g/dL — ABNORMAL LOW (ref 12.0–15.0)
Immature Granulocytes: 0 %
Lymphocytes Relative: 35 %
Lymphs Abs: 1.9 10*3/uL (ref 0.7–4.0)
MCH: 30.3 pg (ref 26.0–34.0)
MCHC: 32 g/dL (ref 30.0–36.0)
MCV: 94.8 fL (ref 80.0–100.0)
Monocytes Absolute: 0.6 10*3/uL (ref 0.1–1.0)
Monocytes Relative: 12 %
Neutro Abs: 2.8 10*3/uL (ref 1.7–7.7)
Neutrophils Relative %: 52 %
Platelet Count: 339 10*3/uL (ref 150–400)
RBC: 2.9 MIL/uL — ABNORMAL LOW (ref 3.87–5.11)
RDW: 18.3 % — ABNORMAL HIGH (ref 11.5–15.5)
WBC Count: 5.4 10*3/uL (ref 4.0–10.5)
nRBC: 0 % (ref 0.0–0.2)

## 2022-10-11 LAB — PREGNANCY, URINE: Preg Test, Ur: NEGATIVE

## 2022-10-11 MED ORDER — DEXAMETHASONE 4 MG PO TABS
20.0000 mg | ORAL_TABLET | Freq: Once | ORAL | Status: AC
  Administered 2022-10-11: 20 mg via ORAL
  Filled 2022-10-11: qty 5

## 2022-10-11 MED ORDER — BORTEZOMIB CHEMO SQ INJECTION 3.5 MG (2.5MG/ML)
1.3000 mg/m2 | Freq: Once | INTRAMUSCULAR | Status: AC
  Administered 2022-10-11: 2.75 mg via SUBCUTANEOUS
  Filled 2022-10-11: qty 1.1

## 2022-10-14 ENCOUNTER — Encounter: Payer: Self-pay | Admitting: Hematology and Oncology

## 2022-10-15 ENCOUNTER — Inpatient Hospital Stay: Payer: 59 | Attending: Hematology and Oncology

## 2022-10-15 ENCOUNTER — Other Ambulatory Visit: Payer: Self-pay | Admitting: *Deleted

## 2022-10-15 VITALS — BP 142/94 | HR 98 | Temp 98.1°F | Resp 19 | Wt 227.2 lb

## 2022-10-15 DIAGNOSIS — C9000 Multiple myeloma not having achieved remission: Secondary | ICD-10-CM

## 2022-10-15 DIAGNOSIS — Z79899 Other long term (current) drug therapy: Secondary | ICD-10-CM | POA: Diagnosis not present

## 2022-10-15 DIAGNOSIS — D472 Monoclonal gammopathy: Secondary | ICD-10-CM

## 2022-10-15 DIAGNOSIS — Z5112 Encounter for antineoplastic immunotherapy: Secondary | ICD-10-CM | POA: Diagnosis present

## 2022-10-15 MED ORDER — SODIUM CHLORIDE 0.9 % IV SOLN: Freq: Once | INTRAVENOUS | Status: AC

## 2022-10-15 MED ORDER — LENALIDOMIDE 15 MG PO CAPS
15.0000 mg | ORAL_CAPSULE | Freq: Every day | ORAL | 0 refills | Status: DC
Start: 2022-10-15 — End: 2022-11-04

## 2022-10-15 MED ORDER — BORTEZOMIB CHEMO SQ INJECTION 3.5 MG (2.5MG/ML)
1.3000 mg/m2 | Freq: Once | INTRAMUSCULAR | Status: AC
  Administered 2022-10-15: 2.75 mg via SUBCUTANEOUS
  Filled 2022-10-15: qty 1.1

## 2022-10-15 MED ORDER — DEXAMETHASONE 4 MG PO TABS
20.0000 mg | ORAL_TABLET | Freq: Once | ORAL | Status: AC
  Administered 2022-10-15: 20 mg via ORAL
  Filled 2022-10-15: qty 5

## 2022-10-15 MED ORDER — ZOLEDRONIC ACID 4 MG/100ML IV SOLN
4.0000 mg | Freq: Once | INTRAVENOUS | Status: AC
  Administered 2022-10-15: 4 mg via INTRAVENOUS
  Filled 2022-10-15: qty 100

## 2022-10-15 NOTE — Patient Instructions (Signed)
[facility]  Discharge Instructions: Thank you for choosing [facility] to provide your oncology and hematology care.   If you have a lab appointment with the Cancer Center, please go directly to the Cancer Center and check in at the registration area.   Wear comfortable clothing and clothing appropriate for easy access to any Portacath or PICC line.   We strive to give you quality time with your provider. You may need to reschedule your appointment if you arrive late (15 or more minutes).  Arriving late affects you and other patients whose appointments are after yours.  Also, if you miss three or more appointments without notifying the office, you may be dismissed from the clinic at the provider's discretion.      For prescription refill requests, have your pharmacy contact our office and allow 72 hours for refills to be completed.    Today you received the following chemotherapy and/or immunotherapy agents: Velcade     To help prevent nausea and vomiting after your treatment, we encourage you to take your nausea medication as directed.  BELOW ARE SYMPTOMS THAT SHOULD BE REPORTED IMMEDIATELY: *FEVER GREATER THAN 100.4 F (38 C) OR HIGHER *CHILLS OR SWEATING *NAUSEA AND VOMITING THAT IS NOT CONTROLLED WITH YOUR NAUSEA MEDICATION *UNUSUAL SHORTNESS OF BREATH *UNUSUAL BRUISING OR BLEEDING *URINARY PROBLEMS (pain or burning when urinating, or frequent urination) *BOWEL PROBLEMS (unusual diarrhea, constipation, pain near the anus) TENDERNESS IN MOUTH AND THROAT WITH OR WITHOUT PRESENCE OF ULCERS (sore throat, sores in mouth, or a toothache) UNUSUAL RASH, SWELLING OR PAIN  UNUSUAL VAGINAL DISCHARGE OR ITCHING   Items with * indicate a potential emergency and should be followed up as soon as possible or go to the Emergency Department if any problems should occur.  Please show the CHEMOTHERAPY ALERT CARD or IMMUNOTHERAPY ALERT CARD at check-in  to the Emergency Department and triage nurse.  Should you have questions after your visit or need to cancel or reschedule your appointment, please contact [facility]  Dept: [phone]  and follow the prompts.  Office hours are 8:00 a.m. to 4:30 p.m. Monday - Friday. Please note that voicemails left after 4:00 p.m. may not be returned until the following business day.  We are closed weekends and major holidays. You have access to a nurse at all times for urgent questions. Please call the main number to the clinic Dept: [phone] and follow the prompts.   For any non-urgent questions, you may also contact your provider using MyChart. We now offer e-Visits for anyone 18 and older to request care online for non-urgent symptoms. For details visit [email or web address].   Also download the MyChart app! Go to the app store, search "MyChart", open the app, select [facility], and log in with your MyChart username and password.   

## 2022-10-15 NOTE — Telephone Encounter (Signed)
This  RN was able to obtain authorization number per [employer] per MD survey and Pt survey performed.  Request for fill sent to [facility] with auth number including due to child bearing age female- expires in 3 days.

## 2022-10-15 NOTE — Progress Notes (Signed)
Per Dr Chryl Heck ok to give zometa with  Corr Ca = 8.78.

## 2022-10-16 ENCOUNTER — Ambulatory Visit
Admission: RE | Admit: 2022-10-16 | Discharge: 2022-10-16 | Disposition: A | Discharge status: Home / Self Care | Payer: 59 | Source: Ambulatory Visit

## 2022-10-16 DIAGNOSIS — Z1231 Encounter for screening mammogram for malignant neoplasm of breast: Secondary | ICD-10-CM

## 2022-10-16 LAB — PROTEIN ELECTROPHORESIS, SERUM, WITH REFLEX
A/G Ratio: 0.4 — ABNORMAL LOW (ref 0.7–1.7)
Albumin ELP: 3.6 g/dL (ref 2.9–4.4)
Alpha-1-Globulin: 0.3 g/dL (ref 0.0–0.4)
Alpha-2-Globulin: 1 g/dL (ref 0.4–1.0)
Beta Globulin: 1 g/dL (ref 0.7–1.3)
Gamma Globulin: 7.7 g/dL — ABNORMAL HIGH (ref 0.4–1.8)
Globulin, Total: 10.1 g/dL — ABNORMAL HIGH (ref 2.2–3.9)
M-Spike, %: 7.6 g/dL — ABNORMAL HIGH
SPEP Interpretation: 0
Total Protein ELP: 13.7 g/dL — ABNORMAL HIGH (ref 6.0–8.5)

## 2022-10-16 LAB — IMMUNOFIXATION REFLEX, SERUM
IgA: 69 mg/dL — ABNORMAL LOW (ref 87–352)
IgG (Immunoglobin G), Serum: 7122 mg/dL — ABNORMAL HIGH (ref 586–1602)
IgM (Immunoglobulin M), Srm: 16 mg/dL — ABNORMAL LOW (ref 26–217)

## 2022-10-16 NOTE — Progress Notes (Signed)
[facility] OFFICE PROGRESS NOTE  [name], MD [address] [place] [address]  DIAGNOSIS: IgG Kappa Multiple myeloma   CURRENT THERAPY:  1) Velcade, Revlimid, and decadron. First dose on 10/11/22. 2) Zometa 4 mg q. 21 days.  Most recently given on 10/15/2022  INTERVAL HISTORY: Kara Medina 47 y.o. female returns to the clinic today for follow-up visit accompanied by her husband.  The patient is followed by the clinic for treatment of her newly diagnosed multiple myeloma.  She is currently undergoing treatment with VRD.  She tolerated day 1 and 4 of her most recent cycle well without any concerning adverse side effects except she is asking for some clarification for how to take her decadron.  He has an outpatient prescription to take Decadron by mouth 20 mg 1 time per week.  She is wondering if she should be taking Decadron on the day of her infusions as she received 20 mg of Decadron on days 1, 4, 8, 11 per the care plan.  She mentions that her only noticeable side effect after her last infusion was increased palpitations for approximately 1 hour.  Of note the patient received 20 mg of Decadron in the clinic that day and then returned home and took another 20 mg of Decadron. She also is anemic.  Does have a history of palpitations for which she has previously seen cardiology and had EKG and echocardiogram.  She was placed on labetalol last week by cardiology. She mention the palpitations to the provider at [facility] transplant team yesterday who felt this was secondary to her anemia.  When she experiences palpitations, denies any syncope, shortness of breath, or lightheadedness.  She is not sure what her vitals are pulse is during these episodes.  She denies any history of arrhythmia.   She is planning on starting her Revlimid tomorrow.  She also wanted clarification of the dose of the aspirin that she should be taking.  Otherwise she denies any major changes in  her health since last being seen.  She denies any fever, chills, or night sweats.  She uses Zyrtec for nasal congestion.  Denies any rashes or skin changes.  Denies any signs or symptoms of infection including sore throat, cough, shortness of breath, skin infections, dysuria, abdominal pain, or diarrhea.  She sometimes gets nosebleeding secondary to forceful blowing.  Denies any abnormal bleeding or bruising including gingival bleeding, hemoptysis, hematemesis, melena, or hematochezia.  She has some right-sided rib pain for which she has a prescription for tramadol and Flexeril although she is reluctant to take this.  She did take 1 tramadol a few nights ago.  She denies any recent nausea. She is here today for evaluation repeat blood work before undergoing her next treatment.   MEDICAL HISTORY: Past Medical History:  Diagnosis Date   Anemia    Gestational diabetes    diet controlled   Postpartum care following vaginal delivery (7/27) 02/09/2016   Pregnancy induced hypertension    Trigeminal neuralgia    2012 corrected with surgery    ALLERGIES:  is allergic to amoxicillin.  MEDICATIONS:  Current Outpatient Medications  Medication Sig Dispense Refill   amLODipine (NORVASC) 5 MG tablet Take 1 tablet (5 mg total) by mouth daily. 90 tablet 3   Blood Pressure Monitoring (BLOOD PRESSURE CUFF) MISC 1 Device by Does not apply route daily. 1 each 0   cyclobenzaprine (FLEXERIL) 5 MG tablet Take 1 tablet (5 mg total) by mouth  3 (three) times daily as needed for muscle spasms. 30 tablet 0   dexamethasone (DECADRON) 4 MG tablet Take 5 tablets (20 mg total) by mouth once a week. 40 tablet 3   ibuprofen (ADVIL) 200 MG tablet Take 400 mg by mouth every 6 (six) hours as needed for headache or moderate pain.     labetalol (NORMODYNE) 200 MG tablet Take 1 tablet (200 mg total) by mouth 2 (two) times daily. 180 tablet 3   lenalidomide (REVLIMID) 15 MG capsule Take 1 capsule (15 mg total) by mouth daily. Take  for 14 days, hold for 7 days. Repeat every 21 days. 14 capsule 0   lidocaine-prilocaine (EMLA) cream Apply to affected area once 30 g 3   Multiple Vitamin (MULTIVITAMIN ADULT PO) Take by mouth.     traMADol (ULTRAM) 50 MG tablet Take 1 tablet (50 mg total) by mouth every 12 (twelve) hours as needed. 30 tablet 0   No current facility-administered medications for this visit.    SURGICAL HISTORY:  Past Surgical History:  Procedure Laterality Date   APPENDECTOMY  2008   TRIGEMINAL NERVE DECOMPRESSION  2012    REVIEW OF SYSTEMS:   Review of Systems  Constitutional: Negative for appetite change, chills, fatigue, fever and unexpected weight change.  HENT: Negative for mouth sores, nosebleeds, sore throat and trouble swallowing.   Eyes: Negative for eye problems and icterus.  Respiratory: Negative for cough, hemoptysis, shortness of breath and wheezing.   Cardiovascular: Positive for intermittent palpitations.  Negative for chest pain and leg swelling.  Gastrointestinal: Negative for abdominal pain, constipation, diarrhea, nausea and vomiting.  Genitourinary: Negative for bladder incontinence, difficulty urinating, dysuria, frequency and hematuria.   Musculoskeletal: Negative for back pain, gait problem, neck pain and neck stiffness.  Skin: Negative for itching and rash.  Neurological: Negative for dizziness, extremity weakness, gait problem, headaches, light-headedness and seizures.  Hematological: Negative for adenopathy. Does not bruise/bleed easily.  Psychiatric/Behavioral: Negative for confusion, depression and sleep disturbance. The patient is not nervous/anxious.     PHYSICAL EXAMINATION:  Blood pressure 118/73, pulse 96, temperature 97.7 F (36.5 C), resp. rate 20, weight 225 lb 14.4 oz (102.5 kg), SpO2 96 %.  ECOG PERFORMANCE STATUS: 1  Physical Exam  Constitutional: Oriented to person, place, and time and well-developed, well-nourished, and in no distress.  HENT:  Head:  Normocephalic and atraumatic.  Mouth/Throat: Oropharynx is clear and moist. No oropharyngeal exudate.  Eyes: Conjunctivae are normal. Right eye exhibits no discharge. Left eye exhibits no discharge. No scleral icterus.  Neck: Normal range of motion. Neck supple.  Cardiovascular: Normal rate, regular rhythm, normal heart sounds and intact distal pulses.   Pulmonary/Chest: Effort normal and breath sounds normal. No respiratory distress. No wheezes. No rales.  Abdominal: Soft. Bowel sounds are normal. Exhibits no distension and no mass. There is no tenderness.  Musculoskeletal: Normal range of motion. Exhibits no edema.  Lymphadenopathy:    No cervical adenopathy.  Neurological: Alert and oriented to person, place, and time. Exhibits normal muscle tone. Gait normal. Coordination normal.  Skin: Skin is warm and dry. No rash noted. Not diaphoretic. No erythema. No pallor.  Psychiatric: Mood, memory and judgment normal.  Vitals reviewed.  LABORATORY DATA: Lab Results  Component Value Date   WBC 8.6 10/18/2022   HGB 8.7 (L) 10/18/2022   HCT 26.9 (L) 10/18/2022   MCV 95.4 10/18/2022   PLT 242 10/18/2022      Chemistry      Component Value Date/Time  NA 130 (L) 10/18/2022 0945   K 4.1 10/18/2022 0945   CL 106 10/18/2022 0945   CO2 25 10/18/2022 0945   BUN 15 10/18/2022 0945   CREATININE 0.74 10/18/2022 0945   CREATININE 0.95 08/23/2022 1631      Component Value Date/Time   CALCIUM 6.9 (L) 10/18/2022 0945   ALKPHOS 43 10/18/2022 0945   AST 13 (L) 10/18/2022 0945   ALT 22 10/18/2022 0945   BILITOT 0.3 10/18/2022 0945       RADIOGRAPHIC STUDIES:  MM 3D SCREEN BREAST BILATERAL  Result Date: 10/17/2022 CLINICAL DATA:  Screening. EXAM: DIGITAL SCREENING BILATERAL MAMMOGRAM WITH TOMOSYNTHESIS AND CAD TECHNIQUE: Bilateral screening digital craniocaudal and mediolateral oblique mammograms were obtained. Bilateral screening digital breast tomosynthesis was performed. The images were  evaluated with computer-aided detection. COMPARISON:  Previous exam(s). ACR Breast Density Category b: There are scattered areas of fibroglandular density. FINDINGS: There are no findings suspicious for malignancy. IMPRESSION: No mammographic evidence of malignancy. A result letter of this screening mammogram will be mailed directly to the patient. RECOMMENDATION: Screening mammogram in one year. (Code:SM-B-01Y) BI-RADS CATEGORY  1: Negative. Electronically Signed   By: Norva Pavlov M.D.   On: 10/17/2022 07:46   ECHOCARDIOGRAM COMPLETE  Result Date: 10/09/2022    ECHOCARDIOGRAM REPORT   Patient Name:   CYLAH FRIEDT Date of Exam: 10/08/2022 Medical Rec #:  [id number]       Height:       66.0 in Accession #:    [id number]      Weight:       227.2 lb Date of Birth:  11/16/1975       BSA:          2.111 m Patient Age:    46 years        BP:           128/80 mmHg Patient Gender: F               HR:           84 bpm. Exam Location:  [address] Procedure: 2D Echo, Cardiac Doppler, Color Doppler, Strain Analysis and            Intracardiac Opacification Agent Indications:    Left Ventricular Hypertrophy I51  History:        Patient has no prior history of Echocardiogram examinations.                 Risk Factors:Hypertension.  Sonographer:    Thurman Coyer RDCS Referring Phys: [id number] [facility] A CHANDRASEKHAR IMPRESSIONS  1. Left ventricular ejection fraction, by estimation, is 60 to 65%. The left ventricle has normal function. The left ventricle has no regional wall motion abnormalities. There is mild left ventricular hypertrophy. Left ventricular diastolic parameters were normal. GLS -19.5%.  2. Right ventricular systolic function is normal. The right ventricular size is normal.  3. The mitral valve is normal in structure. No evidence of mitral valve regurgitation. No evidence of mitral stenosis.  4. The aortic valve is normal in structure. Aortic valve regurgitation is not visualized. No aortic stenosis is  present.  5. The inferior vena cava is normal in size with greater than 50% respiratory variability, suggesting right atrial pressure of 3 mmHg. FINDINGS  Left Ventricle: Left ventricular ejection fraction, by estimation, is 60 to 65%. The left ventricle has normal function. The left ventricle has no regional wall motion abnormalities. Definity contrast agent was given IV to delineate the left ventricular  endocardial borders. The left ventricular internal cavity size was normal in size. There is mild left ventricular hypertrophy. Left ventricular diastolic parameters were normal. Right Ventricle: The right ventricular size is normal. No increase in right ventricular wall thickness. Right ventricular systolic function is normal. Left Atrium: Left atrial size was normal in size. Right Atrium: Right atrial size was normal in size. Pericardium: There is no evidence of pericardial effusion. Mitral Valve: The mitral valve is normal in structure. No evidence of mitral valve regurgitation. No evidence of mitral valve stenosis. Tricuspid Valve: The tricuspid valve is normal in structure. Tricuspid valve regurgitation is not demonstrated. No evidence of tricuspid stenosis. Aortic Valve: The aortic valve is normal in structure. Aortic valve regurgitation is not visualized. No aortic stenosis is present. Pulmonic Valve: The pulmonic valve was normal in structure. Pulmonic valve regurgitation is not visualized. No evidence of pulmonic stenosis. Aorta: The aortic root is normal in size and structure. Venous: The inferior vena cava is normal in size with greater than 50% respiratory variability, suggesting right atrial pressure of 3 mmHg. IAS/Shunts: No atrial level shunt detected by color flow Doppler.  LEFT VENTRICLE PLAX 2D LVIDd:         4.90 cm   Diastology LVIDs:         2.60 cm   LV e' lateral: 6.85 cm/s LV PW:         1.30 cm LV IVS:        1.30 cm LVOT diam:     2.30 cm LV SV:         83 LV SV Index:   39 LVOT Area:      4.15 cm  RIGHT VENTRICLE RV Basal diam:  4.10 cm RV Mid diam:    3.30 cm RV S prime:     15.30 cm/s TAPSE (M-mode): 1.6 cm LEFT ATRIUM             Index        RIGHT ATRIUM           Index LA diam:        4.80 cm 2.27 cm/m   RA Area:     14.00 cm LA Vol (A2C):   54.4 ml 25.77 ml/m  RA Volume:   28.40 ml  13.45 ml/m LA Vol (A4C):   50.8 ml 24.06 ml/m LA Biplane Vol: 54.3 ml 25.72 ml/m  AORTIC VALVE LVOT Vmax:   109.00 cm/s LVOT Vmean:  72.000 cm/s LVOT VTI:    0.199 m  AORTA Ao Root diam: 3.00 cm TRICUSPID VALVE TR Peak grad:   18.1 mmHg TR Vmax:        213.00 cm/s  SHUNTS Systemic VTI:  0.20 m Systemic Diam: 2.30 cm Aditya Sabharwal Electronically signed by Dorthula Nettles Signature Date/Time: 10/09/2022/12:17:24 PM    Final      ASSESSMENT/PLAN:  This is a very pleasant 47 year old African-American female with multiple myeloma that initially presented with severe anemia and was found to have elevated total protein.  Her SPEP showed  8.4 g/dL, IgG kappa.  Kappa lambda ratio at 110.89 No evidence of endorgan damage so far.  BMB confirmed Ig G kappa MM. Bone survey showed lucent lesions identified along the skull, humeri, and left femur as well as questionable areas along the lumbar spine and pelvis.  FISH for multiple myeloma showed a gain of chromosome 11, characterized as standard risk.  Cytogenetics showed normal female karyotype RISS-II, median survival 42 months.  The patient is  followed by Dr. Al Pimple. She started treatment with cycle 1 day 1 of Darzalex/Velcade with dexamethasone on 09/20/2022. Starting from cycle #2, she is being transition to VRD as she is a standard risk.   She is here today for evaluation before starting day 8 cycle #1.  Labs were reviewed.  Recommend that she proceed with day 8 cycle 1 today as scheduled.   She is planning on starting Revlimid tomorrow.  Instructed to pick up an 81 mg aspirin.  She saw the [facility] transplant team yesterday who is in agreement with  the current plan  The patient experiences palpitations a few hours after her appointments in the clinic.  Upon further questioning, it turns out that the patient has been taking 20 mg of Decadron per her outpatient prescription as well as receiving Decadron 20 mg in the clinic.  Therefore, she has been taking 40 mg on days of treatment which is likely contributing to her palpitations in addition to her baseline anemia.  She was instructed to hold her outpatient Decadron if she is receiving this in the clinic.  This is likely just to take on her weeks off of treatment but I will reach out to Dr. Al Pimple to clarify. The patient does have a history of palpitations for which she saw cardiology last month in February 2024 (prior to starting Vrd)  The patient had EKG and echocardiogram and was placed on labetalol for this reason. It is likely her palpitations are exacerbated by the 40 mg of decadron  and her baseline anemia.  However, I did discuss warning symptoms of palpitations that would warrant emergency evaluation.  I instructed the patient to take her vitals if this reoccurs and to ensure she is not significantly tachycardic or have any unstable vitals such as hypotension.  Should she develop palpitations with associated symptoms such as lightheadedness, shortness of breath, syncope, chest pain, she understands to seek emergency room evaluation.  She has not had any of those symptoms at this time.   For pain, she is currently taking tramadol 50 mg every 12 hours as needed, and Flexeril 5 mg p.o. TID as needed.  He also receive Zometa every 21 days given her bone lesions. She is not due at this time as this was last received on 10/15/22  The patient was advised to call immediately if she has any concerning symptoms in the interval. The patient voices understanding of current disease status and treatment options and is in agreement with the current care plan. All questions were answered. The patient knows to  call the clinic with any problems, questions or concerns. We can certainly see the patient much sooner if necessary   No orders of the defined types were placed in this encounter.    The total time spent in the appointment was 20-29 minutes   Tailynn Armetta L Jaylenne Hamelin, PA-C 10/18/22

## 2022-10-18 ENCOUNTER — Inpatient Hospital Stay: Payer: 59

## 2022-10-18 ENCOUNTER — Inpatient Hospital Stay (HOSPITAL_BASED_OUTPATIENT_CLINIC_OR_DEPARTMENT_OTHER): Payer: 59 | Admitting: Physician Assistant

## 2022-10-18 DIAGNOSIS — C9000 Multiple myeloma not having achieved remission: Secondary | ICD-10-CM

## 2022-10-18 DIAGNOSIS — D472 Monoclonal gammopathy: Secondary | ICD-10-CM

## 2022-10-18 DIAGNOSIS — Z5112 Encounter for antineoplastic immunotherapy: Secondary | ICD-10-CM | POA: Diagnosis not present

## 2022-10-18 LAB — CBC WITH DIFFERENTIAL (CANCER CENTER ONLY)
Abs Immature Granulocytes: 0.13 10*3/uL — ABNORMAL HIGH (ref 0.00–0.07)
Basophils Absolute: 0 10*3/uL (ref 0.0–0.1)
Basophils Relative: 0 %
Eosinophils Absolute: 0 10*3/uL (ref 0.0–0.5)
Eosinophils Relative: 0 %
HCT: 26.9 % — ABNORMAL LOW (ref 36.0–46.0)
Hemoglobin: 8.7 g/dL — ABNORMAL LOW (ref 12.0–15.0)
Immature Granulocytes: 2 %
Lymphocytes Relative: 21 %
Lymphs Abs: 1.8 10*3/uL (ref 0.7–4.0)
MCH: 30.9 pg (ref 26.0–34.0)
MCHC: 32.3 g/dL (ref 30.0–36.0)
MCV: 95.4 fL (ref 80.0–100.0)
Monocytes Absolute: 1.1 10*3/uL — ABNORMAL HIGH (ref 0.1–1.0)
Monocytes Relative: 13 %
Neutro Abs: 5.5 10*3/uL (ref 1.7–7.7)
Neutrophils Relative %: 64 %
Platelet Count: 242 10*3/uL (ref 150–400)
RBC: 2.82 MIL/uL — ABNORMAL LOW (ref 3.87–5.11)
RDW: 18.7 % — ABNORMAL HIGH (ref 11.5–15.5)
WBC Count: 8.6 10*3/uL (ref 4.0–10.5)
nRBC: 0.3 % — ABNORMAL HIGH (ref 0.0–0.2)

## 2022-10-18 LAB — CMP (CANCER CENTER ONLY)
ALT: 22 U/L (ref 0–44)
AST: 13 U/L — ABNORMAL LOW (ref 15–41)
Albumin: 2.5 g/dL — ABNORMAL LOW (ref 3.5–5.0)
Alkaline Phosphatase: 43 U/L (ref 38–126)
Anion gap: <0 — ABNORMAL LOW (ref 5–15)
BUN: 15 mg/dL (ref 6–20)
CO2: 25 mmol/L (ref 22–32)
Calcium: 6.9 mg/dL — ABNORMAL LOW (ref 8.9–10.3)
Chloride: 106 mmol/L (ref 98–111)
Creatinine: 0.74 mg/dL (ref 0.44–1.00)
GFR, Estimated: >60 mL/min (ref >60)
Glucose, Bld: 98 mg/dL (ref 70–99)
Potassium: 4.1 mmol/L (ref 3.5–5.1)
Sodium: 130 mmol/L — ABNORMAL LOW (ref 135–145)
Total Bilirubin: 0.3 mg/dL (ref 0.3–1.2)
Total Protein: 11.6 g/dL — ABNORMAL HIGH (ref 6.5–8.1)

## 2022-10-18 MED ORDER — BORTEZOMIB CHEMO SQ INJECTION 3.5 MG (2.5MG/ML)
1.3000 mg/m2 | Freq: Once | INTRAMUSCULAR | Status: AC
  Administered 2022-10-18: 2.75 mg via SUBCUTANEOUS
  Filled 2022-10-18: qty 1.1

## 2022-10-18 MED ORDER — DEXAMETHASONE 4 MG PO TABS
20.0000 mg | ORAL_TABLET | Freq: Once | ORAL | Status: AC
  Administered 2022-10-18: 20 mg via ORAL
  Filled 2022-10-18: qty 5

## 2022-10-18 NOTE — Progress Notes (Signed)
Patient seen by Cassie Heilingoetter, PA-C  Vitals are within treatment parameters.  Labs reviewed: and are within treatment parameters.  Per physician team, patient is ready for treatment and there are NO modifications to the treatment plan.  

## 2022-10-18 NOTE — Patient Instructions (Signed)
[facility]  Discharge Instructions: Thank you for choosing [facility] to provide your oncology and hematology care.   If you have a lab appointment with the Cancer Center, please go directly to the Cancer Center and check in at the registration area.   Wear comfortable clothing and clothing appropriate for easy access to any Portacath or PICC line.   We strive to give you quality time with your provider. You may need to reschedule your appointment if you arrive late (15 or more minutes).  Arriving late affects you and other patients whose appointments are after yours.  Also, if you miss three or more appointments without notifying the office, you may be dismissed from the clinic at the provider's discretion.      For prescription refill requests, have your pharmacy contact our office and allow 72 hours for refills to be completed.    Today you received the following chemotherapy and/or immunotherapy agents: Velcade     To help prevent nausea and vomiting after your treatment, we encourage you to take your nausea medication as directed.  BELOW ARE SYMPTOMS THAT SHOULD BE REPORTED IMMEDIATELY: *FEVER GREATER THAN 100.4 F (38 C) OR HIGHER *CHILLS OR SWEATING *NAUSEA AND VOMITING THAT IS NOT CONTROLLED WITH YOUR NAUSEA MEDICATION *UNUSUAL SHORTNESS OF BREATH *UNUSUAL BRUISING OR BLEEDING *URINARY PROBLEMS (pain or burning when urinating, or frequent urination) *BOWEL PROBLEMS (unusual diarrhea, constipation, pain near the anus) TENDERNESS IN MOUTH AND THROAT WITH OR WITHOUT PRESENCE OF ULCERS (sore throat, sores in mouth, or a toothache) UNUSUAL RASH, SWELLING OR PAIN  UNUSUAL VAGINAL DISCHARGE OR ITCHING   Items with * indicate a potential emergency and should be followed up as soon as possible or go to the Emergency Department if any problems should occur.  Please show the CHEMOTHERAPY ALERT CARD or IMMUNOTHERAPY ALERT CARD at check-in  to the Emergency Department and triage nurse.  Should you have questions after your visit or need to cancel or reschedule your appointment, please contact [facility]  Dept: [phone]  and follow the prompts.  Office hours are 8:00 a.m. to 4:30 p.m. Monday - Friday. Please note that voicemails left after 4:00 p.m. may not be returned until the following business day.  We are closed weekends and major holidays. You have access to a nurse at all times for urgent questions. Please call the main number to the clinic Dept: [phone] and follow the prompts.   For any non-urgent questions, you may also contact your provider using MyChart. We now offer e-Visits for anyone 18 and older to request care online for non-urgent symptoms. For details visit [email or web address].   Also download the MyChart app! Go to the app store, search "MyChart", open the app, select [place], and log in with your MyChart username and password.   

## 2022-10-18 NOTE — Telephone Encounter (Signed)
Oral Chemotherapy Pharmacist Encounter  I spoke with patient for overview of: Revlimid for the treatment of multiple myeloma in conjunction with Velcade and dexamethasone, planned duration until disease progression or unacceptable toxicity.   Counseled patient on administration, dosing, side effects, monitoring, drug-food interactions, safe handling, storage, and disposal.  Patient will take Revlimid 15mg  capsules, 1 capsule by mouth once daily, without regard to food, with a full glass of water.  Revlimid will be given 14 days on, 7 days off, repeat every 21 days.  Patient will take dexamethasone 4mg  tablets, 5 tablets (20mg ) by mouth once weekly with breakfast.  Revlimid start date: 10/18/2022  Adverse effects of Revlimid include but are not limited to: nausea, constipation, diarrhea, abdominal pain, rash, fatigue, drug fever, and decreased blood counts.    Reviewed with patient importance of keeping a medication schedule and plan for any missed doses. No barriers to medication adherence identified.  Medication reconciliation performed and medication/allergy list updated.  Patient has already started taking the acyclovir and dexamethasone as patient has been on therapy.   picked up acyclovir and dexamethasone prescriptions and will wait until cycle 1 day 1 prior to starting dexamethasone. Patient counseled on importance of daily aspirin 81mg  for VTE prophylaxis.  Insurance authorization for Revlimid has been obtained.  Revlimid prescription is being dispensed from [facility] as it is a limited distribution medication.  All questions answered.  Patient voiced understanding and appreciation.   Medication education handout placed in mail for patient. Patient knows to call the office with questions or concerns. Oral Chemotherapy Clinic phone number provided to patient.   Bethel Born, PharmD Hematology/Oncology Clinical Pharmacist [facility] Oral Chemotherapy  [facility] [phone] 10/18/2022    12:10 PM

## 2022-10-21 ENCOUNTER — Other Ambulatory Visit: Payer: Self-pay | Admitting: Hematology and Oncology

## 2022-10-21 ENCOUNTER — Telehealth: Payer: Self-pay | Admitting: *Deleted

## 2022-10-21 ENCOUNTER — Emergency Department (HOSPITAL_COMMUNITY): Payer: 59

## 2022-10-21 ENCOUNTER — Other Ambulatory Visit: Payer: Self-pay

## 2022-10-21 ENCOUNTER — Encounter: Payer: Self-pay | Admitting: Hematology and Oncology

## 2022-10-21 ENCOUNTER — Emergency Department (HOSPITAL_COMMUNITY)
Admission: EM | Admit: 2022-10-21 | Discharge: 2022-10-21 | Disposition: A | Discharge status: Home / Self Care | Payer: 59 | Attending: Emergency Medicine | Admitting: Emergency Medicine

## 2022-10-21 DIAGNOSIS — R55 Syncope and collapse: Secondary | ICD-10-CM | POA: Diagnosis not present

## 2022-10-21 DIAGNOSIS — R944 Abnormal results of kidney function studies: Secondary | ICD-10-CM | POA: Insufficient documentation

## 2022-10-21 DIAGNOSIS — R42 Dizziness and giddiness: Secondary | ICD-10-CM | POA: Diagnosis present

## 2022-10-21 DIAGNOSIS — R5383 Other fatigue: Secondary | ICD-10-CM | POA: Insufficient documentation

## 2022-10-21 DIAGNOSIS — Z79899 Other long term (current) drug therapy: Secondary | ICD-10-CM | POA: Diagnosis not present

## 2022-10-21 DIAGNOSIS — R000 Tachycardia, unspecified: Secondary | ICD-10-CM | POA: Diagnosis not present

## 2022-10-21 DIAGNOSIS — D649 Anemia, unspecified: Secondary | ICD-10-CM | POA: Diagnosis not present

## 2022-10-21 DIAGNOSIS — E871 Hypo-osmolality and hyponatremia: Secondary | ICD-10-CM | POA: Insufficient documentation

## 2022-10-21 LAB — BASIC METABOLIC PANEL
Anion gap: 1 — ABNORMAL LOW (ref 5–15)
BUN: 26 mg/dL — ABNORMAL HIGH (ref 6–20)
CO2: 21 mmol/L — ABNORMAL LOW (ref 22–32)
Calcium: 7.1 mg/dL — ABNORMAL LOW (ref 8.9–10.3)
Chloride: 106 mmol/L (ref 98–111)
Creatinine, Ser: 0.99 mg/dL (ref 0.44–1.00)
GFR, Estimated: >60 mL/min (ref >60)
Glucose, Bld: 98 mg/dL (ref 70–99)
Potassium: 4.5 mmol/L (ref 3.5–5.1)
Sodium: 128 mmol/L — ABNORMAL LOW (ref 135–145)

## 2022-10-21 LAB — URINALYSIS, ROUTINE W REFLEX MICROSCOPIC
Bacteria, UA: NONE SEEN
Bilirubin Urine: NEGATIVE
Glucose, UA: NEGATIVE mg/dL
Ketones, ur: NEGATIVE mg/dL
Leukocytes,Ua: NEGATIVE
Nitrite: NEGATIVE
Protein, ur: 100 mg/dL — AB
Specific Gravity, Urine: 1.016 (ref 1.005–1.030)
pH: 5 (ref 5.0–8.0)

## 2022-10-21 LAB — CBC
HCT: 27.6 % — ABNORMAL LOW (ref 36.0–46.0)
Hemoglobin: 8.5 g/dL — ABNORMAL LOW (ref 12.0–15.0)
MCH: 29.9 pg (ref 26.0–34.0)
MCHC: 30.8 g/dL (ref 30.0–36.0)
MCV: 97.2 fL (ref 80.0–100.0)
Platelets: 196 10*3/uL (ref 150–400)
RBC: 2.84 MIL/uL — ABNORMAL LOW (ref 3.87–5.11)
RDW: 18.8 % — ABNORMAL HIGH (ref 11.5–15.5)
WBC: 14 10*3/uL — ABNORMAL HIGH (ref 4.0–10.5)
nRBC: 0.7 % — ABNORMAL HIGH (ref 0.0–0.2)

## 2022-10-21 LAB — I-STAT BETA HCG BLOOD, ED (MC, WL, AP ONLY): I-stat hCG, quantitative: <5 m[IU]/mL (ref <5)

## 2022-10-21 LAB — CBG MONITORING, ED: Glucose-Capillary: 71 mg/dL (ref 70–99)

## 2022-10-21 MED ORDER — SODIUM CHLORIDE 0.9 % IV BOLUS
1000.0000 mL | Freq: Once | INTRAVENOUS | Status: AC
  Administered 2022-10-21: 1000 mL via INTRAVENOUS

## 2022-10-21 NOTE — Progress Notes (Signed)
Treatment plan will be updated back to [facility] based on transplant team recommendations. Message sent to our pharmD for assistance.  Rashay Barnette

## 2022-10-21 NOTE — Progress Notes (Signed)
Treatment plan updated.  Kara Medina

## 2022-10-21 NOTE — ED Provider Triage Note (Signed)
Emergency Medicine Provider Triage Evaluation Note  Kara Medina , a 47 y.o. female  was evaluated in triage.  Patient complains of lightheadedness and syncopal episode.  She is currently under treatment with oral chemotherapy for multiple myeloma.  She has never had an episode like this before but she felt like she stood up and everything was wobbly.  Said that she was also dizzy and somewhat lightheaded.  She was able to lower herself to the ground and denies any head trauma.  Husband says that they checked her blood pressure and it was 90s over 50s.  Called cancer center and they sent her here  Review of Systems  Positive:  Negative:   Physical Exam  BP 125/80 (BP Location: Right Arm)   Pulse 91   Temp 98.8 F (37.1 C) (Oral)   Resp 18   SpO2 100%  Gen:   Awake, no distress   Resp:  Normal effort  MSK:   Moves extremities without difficulty  Other:    Medical Decision Making  Medically screening exam initiated at 3:36 PM.  Appropriate orders placed.  NOAM MAPLES was informed that the remainder of the evaluation will be completed by another provider, this initial triage assessment does not replace that evaluation, and the importance of remaining in the ED until their evaluation is complete.  Workup initiated.  Due to malignancy, will go ahead and obtain head CT from triage as well   Saddie Benders, PA-C 10/21/22 1537

## 2022-10-21 NOTE — Telephone Encounter (Signed)
This RN spoke with per her VM stating " I am not doing well " - she states she "slept all day yesterday - then this morning I got up to go to work- though I work from home- and just felt dizzy"  She states she went to the bathroom " and passed out " - she states she was sitting on the toilet having a BM.  She did not fall off the toilet.  Husband came and took her BP - which she does not remember but that "it was just 2 numbers on the top".  She states she has not been eating or drinking well due to severity of weakness "and just want to sleep".  This RN spoke with pt at 245pm.  Per review with MD concern for possible need for blood or IV fluids and due to time of day pt should proceed to the ER.  Informed pt of the above- with verbalized agreement including that she has her chemo card with her to show upon arrival to the ER.  This RN Teacher, English as a foreign language in ER at [facility] and informed her of pending arrival per above.

## 2022-10-21 NOTE — Discharge Instructions (Signed)
Return for any problem.  ?

## 2022-10-21 NOTE — ED Triage Notes (Addendum)
Pt reports fatigue x1 day.  Currently on chemo pills and injection(last injection Friday) Went to restroom this am and LOC, reports dizziness prior to LOC.  Denies hitting head.  Denies blood thinner usage.

## 2022-10-21 NOTE — ED Notes (Signed)
Patient transported to CT 

## 2022-10-21 NOTE — ED Notes (Signed)
Patient verbalizes understanding of discharge instructions. Opportunity for questioning and answers were provided. Armband removed by staff, pt discharged from ED. Ambulated out to lobby with family ? ?

## 2022-10-21 NOTE — ED Notes (Signed)
Pt is an active chemo pt that is coming to the ED for evaluation. Sent by cancer center.

## 2022-10-21 NOTE — ED Provider Notes (Signed)
[place] [facility] AT [facility] Provider Note   CSN: [id number] Arrival date & time: 10/21/22  1509     History {Add pertinent medical, surgical, social history, OB history to HPI:1} Chief Complaint  Patient presents with   Fatigue   Loss of Consciousness    Kara Medina is a 47 y.o. female.   Loss of Consciousness      Home Medications Prior to Admission medications   Medication Sig Start Date End Date Taking? Authorizing Provider  amLODipine (NORVASC) 5 MG tablet Take 1 tablet (5 mg total) by mouth daily. 08/29/22   Christell Constant, MD  Blood Pressure Monitoring (BLOOD PRESSURE CUFF) MISC 1 Device by Does not apply route daily. 08/23/22   [name], MD  cyclobenzaprine (FLEXERIL) 5 MG tablet Take 1 tablet (5 mg total) by mouth 3 (three) times daily as needed for muscle spasms. 09/27/22   Rachel Moulds, MD  dexamethasone (DECADRON) 4 MG tablet Take 5 tablets (20 mg total) by mouth once a week. 10/07/22   Rachel Moulds, MD  ibuprofen (ADVIL) 200 MG tablet Take 400 mg by mouth every 6 (six) hours as needed for headache or moderate pain.    [provider]  labetalol (NORMODYNE) 200 MG tablet Take 1 tablet (200 mg total) by mouth 2 (two) times daily. 10/08/22   Chandrasekhar, [place] Dragon A, MD  lenalidomide (REVLIMID) 15 MG capsule Take 1 capsule (15 mg total) by mouth daily. Take for 14 days, hold for 7 days. Repeat every 21 days. 10/15/22   Rachel Moulds, MD  lidocaine-prilocaine (EMLA) cream Apply to affected area once 10/07/22   Rachel Moulds, MD  Multiple Vitamin (MULTIVITAMIN ADULT PO) Take by mouth.    [provider]  traMADol (ULTRAM) 50 MG tablet Take 1 tablet (50 mg total) by mouth every 12 (twelve) hours as needed. 09/27/22   Rachel Moulds, MD      Allergies    Amoxicillin    Review of Systems   Review of Systems  Cardiovascular:  Positive for syncope.    Physical Exam Updated Vital Signs BP 135/80 (BP  Location: Left Arm)   Pulse 94   Temp 98.7 F (37.1 C) (Oral)   Resp (!) 26   SpO2 97%  Physical Exam  ED Results / Procedures / Treatments   Labs (all labs ordered are listed, but only abnormal results are displayed) Labs Reviewed  BASIC METABOLIC PANEL - Abnormal; Notable for the following components:      Result Value   Sodium 128 (*)    CO2 21 (*)    BUN 26 (*)    Calcium 7.1 (*)    Anion gap 1 (*)    All other components within normal limits  CBC - Abnormal; Notable for the following components:   WBC 14.0 (*)    RBC 2.84 (*)    Hemoglobin 8.5 (*)    HCT 27.6 (*)    RDW 18.8 (*)    nRBC 0.7 (*)    All other components within normal limits  URINALYSIS, ROUTINE W REFLEX MICROSCOPIC - Abnormal; Notable for the following components:   APPearance HAZY (*)    Hgb urine dipstick MODERATE (*)    Protein, ur 100 (*)    All other components within normal limits  CBG MONITORING, ED  I-STAT BETA HCG BLOOD, ED ([facility], [facility], [facility] ONLY)    EKG EKG Interpretation  Date/Time:  Monday October 21 2022 15:52:24 EDT Ventricular Rate:  95 PR Interval:  147 QRS Duration: 78 QT Interval:  344 QTC Calculation: 433 R Axis:   40 Text Interpretation: Sinus rhythm Consider right atrial enlargement Consider left ventricular hypertrophy ST elev, probable normal early repol pattern Confirmed by Kristine Royal [phone]) on 10/21/2022 4:34:40 PM  Radiology CT Head Wo Contrast  Result Date: 10/21/2022 CLINICAL DATA:  Head trauma lightheaded syncope EXAM: CT HEAD WITHOUT CONTRAST TECHNIQUE: Contiguous axial images were obtained from the base of the skull through the vertex without intravenous contrast. RADIATION DOSE REDUCTION: This exam was performed according to the departmental dose-optimization program which includes automated exposure control, adjustment of the mA and/or kV according to patient size and/or use of iterative reconstruction technique. COMPARISON:  None Available. FINDINGS: Brain: No acute  territorial infarction, hemorrhage or intracranial mass. The ventricles are nonenlarged. Vascular: No hyperdense vessel or unexpected calcification. Skull: Normal. Negative for fracture or focal lesion. Sinuses/Orbits: Moderate mucosal thickening in the sinuses with fluid level in the right maxillary sinus Other: None IMPRESSION: 1. Negative non contrasted CT appearance of the brain. 2. Acute on chronic sinusitis. Electronically Signed   By: Jasmine Pang M.D.   On: 10/21/2022 17:59    Procedures Procedures  {Document cardiac monitor, telemetry assessment procedure when appropriate:1}  Medications Ordered in ED Medications  sodium chloride 0.9 % bolus 1,000 mL (0 mLs Intravenous Stopped 10/21/22 1916)  sodium chloride 0.9 % bolus 1,000 mL (1,000 mLs Intravenous New Bag/Given 10/21/22 1913)    ED Course/ Medical Decision Making/ A&P Clinical Course as of 10/21/22 2050  Mon Oct 21, 2022  1609 Glucose-Capillary: 43 [MR]    Clinical Course User Index [MR] Redwine, Madison A, PA-C   {   Click here for ABCD2, HEART and other calculatorsREFRESH Note before signing :1}                          Medical Decision Making  ***  {Document critical care time when appropriate:1} {Document review of labs and clinical decision tools ie heart score, Chads2Vasc2 etc:1}  {Document your independent review of radiology images, and any outside records:1} {Document your discussion with family members, caretakers, and with consultants:1} {Document social determinants of health affecting pt's care:1} {Document your decision making why or why not admission, treatments were needed:1} Final Clinical Impression(s) / ED Diagnoses Final diagnoses:  Near syncope    Rx / DC Orders ED Discharge Orders     None

## 2022-10-22 ENCOUNTER — Other Ambulatory Visit: Payer: Self-pay | Admitting: Hematology and Oncology

## 2022-10-22 ENCOUNTER — Inpatient Hospital Stay: Payer: 59

## 2022-10-22 VITALS — BP 105/62 | HR 91 | Temp 98.1°F | Resp 18

## 2022-10-22 DIAGNOSIS — D472 Monoclonal gammopathy: Secondary | ICD-10-CM

## 2022-10-22 DIAGNOSIS — C9000 Multiple myeloma not having achieved remission: Secondary | ICD-10-CM

## 2022-10-22 DIAGNOSIS — Z5112 Encounter for antineoplastic immunotherapy: Secondary | ICD-10-CM | POA: Diagnosis not present

## 2022-10-22 MED ORDER — BORTEZOMIB CHEMO SQ INJECTION 3.5 MG (2.5MG/ML)
1.3000 mg/m2 | Freq: Once | INTRAMUSCULAR | Status: AC
  Administered 2022-10-22: 2.75 mg via SUBCUTANEOUS
  Filled 2022-10-22: qty 1.1

## 2022-10-22 MED ORDER — DEXAMETHASONE 4 MG PO TABS
20.0000 mg | ORAL_TABLET | Freq: Once | ORAL | Status: AC
  Administered 2022-10-22: 20 mg via ORAL
  Filled 2022-10-22: qty 5

## 2022-10-22 MED ORDER — SODIUM CHLORIDE 0.9 % IV SOLN: INTRAVENOUS | Status: AC

## 2022-10-22 NOTE — Progress Notes (Signed)
D11/C1 OK to treat today per Dr. Al Pimple w/ HR 108. Pt to receive IVF today in the infusion room with treatment

## 2022-10-22 NOTE — Patient Instructions (Signed)
[facility]  Discharge Instructions: Thank you for choosing [facility] to provide your oncology and hematology care.   If you have a lab appointment with the Cancer Center, please go directly to the Cancer Center and check in at the registration area.   Wear comfortable clothing and clothing appropriate for easy access to any Portacath or PICC line.   We strive to give you quality time with your provider. You may need to reschedule your appointment if you arrive late (15 or more minutes).  Arriving late affects you and other patients whose appointments are after yours.  Also, if you miss three or more appointments without notifying the office, you may be dismissed from the clinic at the provider's discretion.      For prescription refill requests, have your pharmacy contact our office and allow 72 hours for refills to be completed.    Today you received the following chemotherapy and/or immunotherapy agents: Velcade      To help prevent nausea and vomiting after your treatment, we encourage you to take your nausea medication as directed.  BELOW ARE SYMPTOMS THAT SHOULD BE REPORTED IMMEDIATELY: *FEVER GREATER THAN 100.4 F (38 C) OR HIGHER *CHILLS OR SWEATING *NAUSEA AND VOMITING THAT IS NOT CONTROLLED WITH YOUR NAUSEA MEDICATION *UNUSUAL SHORTNESS OF BREATH *UNUSUAL BRUISING OR BLEEDING *URINARY PROBLEMS (pain or burning when urinating, or frequent urination) *BOWEL PROBLEMS (unusual diarrhea, constipation, pain near the anus) TENDERNESS IN MOUTH AND THROAT WITH OR WITHOUT PRESENCE OF ULCERS (sore throat, sores in mouth, or a toothache) UNUSUAL RASH, SWELLING OR PAIN  UNUSUAL VAGINAL DISCHARGE OR ITCHING   Items with * indicate a potential emergency and should be followed up as soon as possible or go to the Emergency Department if any problems should occur.  Please show the CHEMOTHERAPY ALERT CARD or IMMUNOTHERAPY ALERT CARD at  check-in to the Emergency Department and triage nurse.  Should you have questions after your visit or need to cancel or reschedule your appointment, please contact [facility]  Dept: [phone]  and follow the prompts.  Office hours are 8:00 a.m. to 4:30 p.m. Monday - Friday. Please note that voicemails left after 4:00 p.m. may not be returned until the following business day.  We are closed weekends and major holidays. You have access to a nurse at all times for urgent questions. Please call the main number to the clinic Dept: [phone] and follow the prompts.   For any non-urgent questions, you may also contact your provider using MyChart. We now offer e-Visits for anyone 12 and older to request care online for non-urgent symptoms. For details visit [email or web address].   Also download the MyChart app! Go to the app store, search "MyChart", open the app, select [place], and log in with your MyChart username and password.  Rehydration, Adult Rehydration is the replacement of fluids, salts, and minerals in the body (electrolytes) that are lost during dehydration. Dehydration is when there is not enough water or other fluids in the body. This happens when you lose more fluids than you take in. Common causes of dehydration include: Not drinking enough fluids. This can occur when you are ill or doing activities that require a lot of energy, especially in hot weather. Conditions that cause loss of water or other fluids. These include diarrhea, vomiting, sweating, and urinating a lot. Other illnesses, such as fever or infection. Certain medicines, such as those that remove  excess fluid from the body (diuretics). Symptoms of mild or moderate dehydration may include thirst, dry lips and mouth, and dizziness. Symptoms of severe dehydration may include increased heart rate, confusion, fainting, and not urinating. In severe cases, you may need to get fluids  through an IV at the hospital. For mild or moderate cases, you can usually rehydrate at home by drinking certain fluids as told by your health care provider. What are the risks? Your health care provider will talk with you about risks. Your health care provider will talk with you about risks. This may include taking in too much fluid (overhydration). This is rare. Overhydration can cause an imbalance of electrolytes in the body, kidney failure, or a decrease in salt (sodium) levels in the body. Supplies needed: You will need an oral rehydration solution (ORS) if your health care provider tells you to use one. This is a drink to treat dehydration. It can be found in pharmacies and retail stores. How to rehydrate Fluids Follow instructions from your health care provider about what to drink. The kind of fluid and the amount you should drink depend on your condition. In general, you should choose drinks that you prefer. If told by your health care provider, drink an ORS. Make an ORS by following instructions on the package. Start by drinking small amounts, about  cup (120 mL) every 5-10 minutes. Slowly increase how much you drink until you have taken in the amount recommended by your health care provider. Drink enough clear fluids to keep your urine pale yellow. If you were told to drink an ORS, finish it first, then start slowly drinking other clear fluids. Drink fluids such as: Water. This includes sparkling and flavored water. Drinking only water can lead to having too little sodium in your body (hyponatremia). Follow the advice of your health care provider. Water from ice chips you suck on. Fruit juice with water added to it (diluted). Sports drinks. Hot or cold herbal teas. Broth-based soups. Milk or milk products. Food Follow instructions from your health care provider about what to eat while you rehydrate. Your health care provider may recommend that you slowly begin eating regular foods in  small amounts. Eat foods that contain a healthy balance of electrolytes, such as bananas, oranges, potatoes, tomatoes, and spinach. Avoid foods that are greasy or contain a lot of sugar. In some cases, you may get nutrition through a feeding tube that is passed through your nose and into your stomach (nasogastric tube, or NG tube). This may be done if you have uncontrolled vomiting or diarrhea. Drinks to avoid  Certain drinks may make dehydration worse. While you rehydrate, avoid drinking alcohol. How to tell if you are recovering from dehydration You may be getting better if: You are urinating more often than before you started rehydrating. Your urine is pale yellow. Your energy level improves. You vomit less often. You have diarrhea less often. Your appetite improves or returns to normal. You feel less dizzy or light-headed. Your skin tone and color start to look more normal. Follow these instructions at home: Take over-the-counter and prescription medicines only as told by your health care provider. Do not take sodium tablets. Doing this can lead to having too much sodium in your body (hypernatremia). Contact a health care provider if: You continue to have symptoms of mild or moderate dehydration, such as: Thirst. Dry lips. Slightly dry mouth. Dizziness. Dark urine or less urine than normal. Muscle cramps. You continue to vomit or have  diarrhea. Get help right away if: You have symptoms of dehydration that get worse. You have a fever. You have a severe headache. You have been vomiting and have problems, such as: Your vomiting gets worse or does not go away. Your vomit includes blood or green matter (bile). You cannot eat or drink without vomiting. You have problems with urination or bowel movements, such as: Diarrhea that gets worse or does not go away. Blood in your stool (feces). This may cause stool to look black and tarry. Not urinating, or urinating only a small amount  of very dark urine, within 6-8 hours. You have trouble breathing. You have symptoms that get worse with treatment. These symptoms may be an emergency. Get help right away. Call 911. Do not wait to see if the symptoms will go away. Do not drive yourself to the hospital. This information is not intended to replace advice given to you by your health care provider. Make sure you discuss any questions you have with your health care provider. Document Revised: 11/12/2021 Document Reviewed: 11/12/2021 Elsevier Patient Education  2023 [employer]

## 2022-10-22 NOTE — Progress Notes (Signed)
Plan advanced to [facility].

## 2022-10-23 ENCOUNTER — Telehealth: Payer: Self-pay | Admitting: Hematology and Oncology

## 2022-10-23 NOTE — Telephone Encounter (Signed)
Spoke with patient confirming all upcoming appointments  

## 2022-10-25 ENCOUNTER — Telehealth: Payer: Self-pay | Admitting: *Deleted

## 2022-10-25 ENCOUNTER — Other Ambulatory Visit: Payer: Self-pay

## 2022-10-28 ENCOUNTER — Telehealth: Payer: Self-pay | Admitting: *Deleted

## 2022-10-28 NOTE — Progress Notes (Signed)
Pharmacist Chemotherapy Monitoring - Initial Assessment    Anticipated start date: 11/04/22   The following has been reviewed per standard work regarding the patient's treatment regimen: The patient's diagnosis, treatment plan and drug doses, and organ/hematologic function Lab orders and baseline tests specific to treatment regimen  The treatment plan start date, drug sequencing, and pre-medications Prior authorization status  Patient's documented medication list, including drug-drug interaction screen and prescriptions for anti-emetics and supportive care specific to the treatment regimen The drug concentrations, fluid compatibility, administration routes, and timing of the medications to be used The patient's access for treatment and lifetime cumulative dose history, if applicable  The patient's medication allergies and previous infusion related reactions, if applicable   Changes made to treatment plan:  N/A  Follow up needed:  N/A   Demetrius Charity, RPH, 10/28/2022  3:50 PM

## 2022-10-28 NOTE — Telephone Encounter (Signed)
This RN contacted pt today to follow up on call from Friday with increased lethargy and decreased BP.  Per discussion pt had not started on calcium supplement.  This RN advised pt to obtain Tums- extra strength and take 2 tablets twice a day.  Kara Medina stated good improvement in how she feels.   She states compliance with above instructions and does not have any other concerns at this time.

## 2022-10-29 ENCOUNTER — Other Ambulatory Visit: Payer: Self-pay

## 2022-10-30 ENCOUNTER — Other Ambulatory Visit: Payer: Self-pay | Admitting: *Deleted

## 2022-10-30 DIAGNOSIS — C9000 Multiple myeloma not having achieved remission: Secondary | ICD-10-CM

## 2022-10-31 ENCOUNTER — Other Ambulatory Visit: Payer: Self-pay | Admitting: *Deleted

## 2022-10-31 ENCOUNTER — Encounter: Payer: Self-pay | Admitting: Hematology and Oncology

## 2022-10-31 DIAGNOSIS — C9000 Multiple myeloma not having achieved remission: Secondary | ICD-10-CM

## 2022-10-31 NOTE — Telephone Encounter (Signed)
No entry 

## 2022-11-01 ENCOUNTER — Ambulatory Visit: Payer: 59 | Admitting: Hematology and Oncology

## 2022-11-01 ENCOUNTER — Ambulatory Visit: Payer: 59

## 2022-11-01 ENCOUNTER — Other Ambulatory Visit: Payer: 59

## 2022-11-01 MED FILL — Dexamethasone Sodium Phosphate Inj 100 MG/10ML: INTRAMUSCULAR | Qty: 2 | Status: AC

## 2022-11-04 ENCOUNTER — Other Ambulatory Visit: Payer: 59

## 2022-11-04 ENCOUNTER — Inpatient Hospital Stay (HOSPITAL_BASED_OUTPATIENT_CLINIC_OR_DEPARTMENT_OTHER): Payer: 59 | Admitting: Adult Health

## 2022-11-04 ENCOUNTER — Other Ambulatory Visit: Payer: Self-pay | Admitting: Hematology and Oncology

## 2022-11-04 ENCOUNTER — Inpatient Hospital Stay: Payer: 59

## 2022-11-04 ENCOUNTER — Other Ambulatory Visit: Payer: Self-pay | Admitting: *Deleted

## 2022-11-04 VITALS — BP 113/71 | HR 86 | Temp 98.1°F | Resp 16

## 2022-11-04 DIAGNOSIS — D472 Monoclonal gammopathy: Secondary | ICD-10-CM

## 2022-11-04 DIAGNOSIS — C9000 Multiple myeloma not having achieved remission: Secondary | ICD-10-CM

## 2022-11-04 DIAGNOSIS — Z5112 Encounter for antineoplastic immunotherapy: Secondary | ICD-10-CM | POA: Diagnosis not present

## 2022-11-04 LAB — CBC WITH DIFFERENTIAL (CANCER CENTER ONLY)
Abs Immature Granulocytes: 0.07 10*3/uL (ref 0.00–0.07)
Basophils Absolute: 0 10*3/uL (ref 0.0–0.1)
Basophils Relative: 0 %
Eosinophils Absolute: 0 10*3/uL (ref 0.0–0.5)
Eosinophils Relative: 0 %
HCT: 24.6 % — ABNORMAL LOW (ref 36.0–46.0)
Hemoglobin: 7.4 g/dL — ABNORMAL LOW (ref 12.0–15.0)
Immature Granulocytes: 1 %
Lymphocytes Relative: 29 %
Lymphs Abs: 3.2 10*3/uL (ref 0.7–4.0)
MCH: 29.7 pg (ref 26.0–34.0)
MCHC: 30.1 g/dL (ref 30.0–36.0)
MCV: 98.8 fL (ref 80.0–100.0)
Monocytes Absolute: 1.9 10*3/uL — ABNORMAL HIGH (ref 0.1–1.0)
Monocytes Relative: 17 %
Neutro Abs: 5.8 10*3/uL (ref 1.7–7.7)
Neutrophils Relative %: 53 %
Platelet Count: 534 10*3/uL — ABNORMAL HIGH (ref 150–400)
RBC: 2.49 MIL/uL — ABNORMAL LOW (ref 3.87–5.11)
RDW: 18.6 % — ABNORMAL HIGH (ref 11.5–15.5)
WBC Count: 11 10*3/uL — ABNORMAL HIGH (ref 4.0–10.5)
nRBC: 0.3 % — ABNORMAL HIGH (ref 0.0–0.2)

## 2022-11-04 LAB — PREGNANCY, URINE: Preg Test, Ur: NEGATIVE

## 2022-11-04 LAB — CMP (CANCER CENTER ONLY)
ALT: 17 U/L (ref 0–44)
AST: 12 U/L — ABNORMAL LOW (ref 15–41)
Albumin: 2.6 g/dL — ABNORMAL LOW (ref 3.5–5.0)
Alkaline Phosphatase: 52 U/L (ref 38–126)
Anion gap: 1 — ABNORMAL LOW (ref 5–15)
BUN: 19 mg/dL (ref 6–20)
CO2: 28 mmol/L (ref 22–32)
Calcium: 8 mg/dL — ABNORMAL LOW (ref 8.9–10.3)
Chloride: 104 mmol/L (ref 98–111)
Creatinine: 0.72 mg/dL (ref 0.44–1.00)
GFR, Estimated: >60 mL/min (ref >60)
Glucose, Bld: 70 mg/dL (ref 70–99)
Potassium: 4.2 mmol/L (ref 3.5–5.1)
Sodium: 133 mmol/L — ABNORMAL LOW (ref 135–145)
Total Bilirubin: 0.2 mg/dL — ABNORMAL LOW (ref 0.3–1.2)
Total Protein: 9.4 g/dL — ABNORMAL HIGH (ref 6.5–8.1)

## 2022-11-04 MED ORDER — ACETAMINOPHEN 325 MG PO TABS
650.0000 mg | ORAL_TABLET | Freq: Once | ORAL | Status: AC
  Administered 2022-11-04: 650 mg via ORAL
  Filled 2022-11-04: qty 2

## 2022-11-04 MED ORDER — PROCHLORPERAZINE MALEATE 10 MG PO TABS
10.0000 mg | ORAL_TABLET | Freq: Four times a day (QID) | ORAL | Status: DC | PRN
  Administered 2022-11-04: 10 mg via ORAL
  Filled 2022-11-04: qty 1

## 2022-11-04 MED ORDER — SODIUM CHLORIDE 0.9 % IV SOLN: Freq: Once | INTRAVENOUS | Status: AC

## 2022-11-04 MED ORDER — SODIUM CHLORIDE 0.9 % IV SOLN
20.0000 mg | Freq: Once | INTRAVENOUS | Status: AC
  Administered 2022-11-04: 20 mg via INTRAVENOUS
  Filled 2022-11-04: qty 20

## 2022-11-04 MED ORDER — DIPHENHYDRAMINE HCL 25 MG PO CAPS
50.0000 mg | ORAL_CAPSULE | Freq: Once | ORAL | Status: AC
  Administered 2022-11-04: 50 mg via ORAL
  Filled 2022-11-04: qty 2

## 2022-11-04 MED ORDER — SODIUM CHLORIDE 0.9 % IV SOLN
16.0000 mg/kg | Freq: Once | INTRAVENOUS | Status: AC
  Administered 2022-11-04: 1600 mg via INTRAVENOUS
  Filled 2022-11-04: qty 80

## 2022-11-04 MED ORDER — LENALIDOMIDE 15 MG PO CAPS
15.0000 mg | ORAL_CAPSULE | Freq: Every day | ORAL | 0 refills | Status: DC
Start: 2022-11-04 — End: 2022-11-25

## 2022-11-04 MED ORDER — BORTEZOMIB CHEMO SQ INJECTION 3.5 MG (2.5MG/ML)
1.3000 mg/m2 | Freq: Once | INTRAMUSCULAR | Status: AC
  Administered 2022-11-04: 2.75 mg via SUBCUTANEOUS
  Filled 2022-11-04: qty 1.1

## 2022-11-04 NOTE — Progress Notes (Unsigned)
[facility] Follow up:    Kara Saint, MD [address] [place] [address]   DIAGNOSIS: Multiple myeloma  SUMMARY OF ONCOLOGIC HISTORY: Oncology History  Multiple myeloma  09/12/2022 Initial Diagnosis   Multiple myeloma (HCC)   09/20/2022 - 10/04/2022 Chemotherapy   Patient is on Treatment Plan : MYELOMA NEWLY DIAGNOSED TRANSPLANT CANDIDATE DaraVRd (Daratumumab IV) q21d x 6 Cycles (Induction/Consolidation)     10/11/2022 - 10/22/2022 Chemotherapy   Patient is on Treatment Plan : MYELOMA NON-TRANSPLANT CANDIDATES VRd SQ q21d     11/04/2022 -  Chemotherapy   Patient is on Treatment Plan : MYELOMA NEWLY DIAGNOSED TRANSPLANT CANDIDATE DaraVRd (Daratumumab IV) q21d x 6 Cycles (Induction/Consolidation)       CURRENT THERAPY:1) Daratumumab, Velcade, Revlimid, and decadron. First dose on 09/20/22. 2) Zometa 4 mg q. 28 days.  Most recently given on 10/15/2022  INTERVAL HISTORY: Kara Medina 47 y.o. female returns for follow-up of her multiple myeloma.  She is doing moderately well today.  She let me know that she is confused about her infusion today because she was under the impression that she is not receiving daratumumab however she is scheduled to receive it.  Otherwise she is feeling moderately well other than some fatigue.  Her hemoglobin is 7.4 today.  Per hematology/oncology note from Dr. Rosaria Ferries at [facility], "Although not discussed in great detail, it would be reasonable to consider using quad induction up front with Daratumumab, revlimid, velcade and dexamethasone to gain quick and deep remission. This was discussed with Dr. Al Pimple, Ms. Schorsch's primary oncologist."    Patient Active Problem List   Diagnosis Date Noted   Multiple myeloma 09/12/2022   MGUS (monoclonal gammopathy of unknown significance) 09/10/2022   Hypertensive urgency 08/29/2022   Iron deficiency anemia 08/29/2022   LVH (left ventricular hypertrophy) 08/29/2022   Normocytic  anemia 08/29/2022    is allergic to amoxicillin.  MEDICAL HISTORY: Past Medical History:  Diagnosis Date   Anemia    Gestational diabetes    diet controlled   Postpartum care following vaginal delivery (7/27) 02/09/2016   Pregnancy induced hypertension    Trigeminal neuralgia    2012 corrected with surgery    SURGICAL HISTORY: Past Surgical History:  Procedure Laterality Date   APPENDECTOMY  2008   TRIGEMINAL NERVE DECOMPRESSION  2012    SOCIAL HISTORY: Social History   Socioeconomic History   Marital status: Married    Spouse name: Not on file   Number of children: Not on file   Years of education: Not on file   Highest education level: Bachelor's degree (e.g., BA, AB, BS)  Occupational History   Not on file  Tobacco Use   Smoking status: Never   Smokeless tobacco: Never  Vaping Use   Vaping Use: Never used  Substance and Sexual Activity   Alcohol use: No   Drug use: No   Sexual activity: Yes  Other Topics Concern   Not on file  Social History Narrative   Not on file   Social Determinants of Health   Financial Resource Strain: Low Risk  (08/23/2022)   Overall Financial Resource Strain (CARDIA)    Difficulty of Paying Living Expenses: Not hard at all  Food Insecurity: No Food Insecurity (08/23/2022)   Hunger Vital Sign    Worried About Running Out of Food in the Last Year: Never true    Ran Out of Food in the Last Year: Never true  Transportation Needs: No Transportation  Needs (08/23/2022)   PRAPARE - Administrator, Civil Service (Medical): No    Lack of Transportation (Non-Medical): No  Physical Activity: Insufficiently Active (08/23/2022)   Exercise Vital Sign    Days of Exercise per Week: 2 days    Minutes of Exercise per Session: 30 min  Stress: Stress Concern Present (08/23/2022)   [employer] of Occupational Health - Occupational Stress Questionnaire    Feeling of Stress : To some extent  Social Connections: Unknown (08/23/2022)    Social Connection and Isolation Panel [NHANES]    Frequency of Communication with Friends and Family: More than three times a week    Frequency of Social Gatherings with Friends and Family: Once a week    Attends Religious Services: Patient declined    Database administrator or Organizations: Yes    Attends Engineer, structural: More than 4 times per year    Marital Status: Married  Catering manager Violence: Not on file    FAMILY HISTORY: Family History  Problem Relation Age of Onset   Cancer Other    Hypertension Other    Healthy Father    Cancer Paternal Grandfather        stomach, lung   Hypertension Mother    Breast cancer Mother 59   Hypertension Maternal Grandmother    Arthritis Maternal Grandmother    Hypertension Maternal Grandfather    Other Brother        ?prediabetic   Diabetes Paternal Grandmother    Hypertension Paternal Grandmother    Autism Son     Review of Systems  Constitutional:  Positive for fatigue. Negative for appetite change, chills, fever and unexpected weight change.  HENT:   Negative for hearing loss, lump/mass and trouble swallowing.   Eyes:  Negative for eye problems and icterus.  Respiratory:  Negative for chest tightness, cough and shortness of breath.   Cardiovascular:  Negative for chest pain, leg swelling and palpitations.  Gastrointestinal:  Negative for abdominal distention, abdominal pain, constipation, diarrhea, nausea and vomiting.  Endocrine: Negative for hot flashes.  Genitourinary:  Negative for difficulty urinating.   Musculoskeletal:  Negative for arthralgias.  Skin:  Negative for itching and rash.  Neurological:  Negative for dizziness, extremity weakness, headaches and numbness.  Hematological:  Negative for adenopathy. Does not bruise/bleed easily.  Psychiatric/Behavioral:  Negative for depression. The patient is not nervous/anxious.       PHYSICAL EXAMINATION     Vitals:   11/04/22 0934  BP: 114/77   Pulse: 94  Resp: 16  Temp: 97.9 F (36.6 C)  SpO2: 97%    Physical Exam Constitutional:      General: She is not in acute distress.    Appearance: Normal appearance. She is not toxic-appearing.  HENT:     Head: Normocephalic and atraumatic.  Eyes:     General: No scleral icterus. Cardiovascular:     Rate and Rhythm: Normal rate and regular rhythm.     Pulses: Normal pulses.     Heart sounds: Normal heart sounds.  Pulmonary:     Effort: Pulmonary effort is normal.     Breath sounds: Normal breath sounds.  Abdominal:     General: Abdomen is flat. Bowel sounds are normal. There is no distension.     Palpations: Abdomen is soft.     Tenderness: There is no abdominal tenderness.  Musculoskeletal:        General: No swelling.     Cervical back: Neck  supple.  Lymphadenopathy:     Cervical: No cervical adenopathy.  Skin:    General: Skin is warm and dry.     Findings: No rash.  Neurological:     General: No focal deficit present.     Mental Status: She is alert.  Psychiatric:        Mood and Affect: Mood normal.        Behavior: Behavior normal.     LABORATORY DATA:  CBC    Component Value Date/Time   WBC 11.0 (H) 11/04/2022 0902   WBC 14.0 (H) 10/21/2022 1535   RBC 2.49 (L) 11/04/2022 0902   HGB 7.4 (L) 11/04/2022 0902   HCT 24.6 (L) 11/04/2022 0902   HCT 24.4 (L) 09/04/2022 1037   PLT 534 (H) 11/04/2022 0902   MCV 98.8 11/04/2022 0902   MCH 29.7 11/04/2022 0902   MCHC 30.1 11/04/2022 0902   RDW 18.6 (H) 11/04/2022 0902   LYMPHSABS 3.2 11/04/2022 0902   MONOABS 1.9 (H) 11/04/2022 0902   EOSABS 0.0 11/04/2022 0902   BASOSABS 0.0 11/04/2022 0902    CMP     Component Value Date/Time   NA 133 (L) 11/04/2022 0902   K 4.2 11/04/2022 0902   CL 104 11/04/2022 0902   CO2 28 11/04/2022 0902   GLUCOSE 70 11/04/2022 0902   BUN 19 11/04/2022 0902   CREATININE 0.72 11/04/2022 0902   CREATININE 0.95 08/23/2022 1631   CALCIUM 8.0 (L) 11/04/2022 0902   PROT 9.4  (H) 11/04/2022 0902   ALBUMIN 2.6 (L) 11/04/2022 0902   AST 12 (L) 11/04/2022 0902   ALT 17 11/04/2022 0902   ALKPHOS 52 11/04/2022 0902   BILITOT 0.2 (L) 11/04/2022 0902   GFRNONAA >60 11/04/2022 0902   GFRAA >60 02/09/2016 0525      ASSESSMENT and THERAPY PLAN:   Multiple myeloma (HCC) Kara Medina is a 47 year old woman with multiple myeloma here today for follow-up and evaluation prior to receiving her routine treatment.  Unfortunately today there was some confusion around her treatment plan.  She is due to receive daratumumab, Velcade, Revlimid, and Decadron.  This was changed back after Dr. Al Pimple spoke with Dr. Rosaria Ferries at [facility] in order to gain a quick and deep remission so she can quickly proceed with transplant.  I apologized to the patient about this miscommunication.    She will proceed with therapy today,  we reviewed her labs which are stable.  We will recheck CBC at her next lab and we will get a tube sent to blood bank in case she needs a blood transfusion.  We will see her back on Thursday for lab and injection.  I asked that we get a CBC and hold tube for blood bank on Thursday in case she needs a blood transfusion.    We will see her back in 1 week for labs and f/u.     All questions were answered. The patient knows to call the clinic with any problems, questions or concerns. We can certainly see the patient much sooner if necessary.  Total encounter time:30 minutes*in face-to-face visit time, chart review, lab review, care coordination, order entry, and documentation of the encounter time.  Lillard Anes, NP 11/06/22 12:16 PM Medical Oncology and Hematology [facility] [address] [place], [place] [address] Tel. [phone]    Fax. [phone]  *Total Encounter Time as defined by the Centers for Medicare and Medicaid Services includes, in addition to the face-to-face time of a patient  visit (documented in the note above) non-face-to-face time:  obtaining and reviewing outside history, ordering and reviewing medications, tests or procedures, care coordination (communications with other health care professionals or caregivers) and documentation in the medical record.

## 2022-11-04 NOTE — Progress Notes (Signed)
Per Mardella Layman NP OK to proceed with tx with Hgb 7.4 today.  Pt to be transitioned to a rapid daratumumab infusion. This RN confirmed with Dr. Claris Che NP, and [facility] OK to change subsequent infusion to rapid. This RN informed Pt and Pt was agreeable.

## 2022-11-04 NOTE — Assessment & Plan Note (Signed)
Kara Medina is a 47 year old woman with multiple myeloma here today for follow-up and evaluation prior to receiving her routine treatment.  Unfortunately today there was some confusion around her treatment plan.  She is due to receive daratumumab, Velcade, Revlimid, and Decadron.  This was changed back after Dr. Al Pimple spoke with Dr. Rosaria Ferries at [facility] in order to gain a quick and deep remission so she can quickly proceed with transplant.  I apologized to the patient about this miscommunication.    She will proceed with therapy today,  we reviewed her labs which are stable.  We will recheck CBC at her next lab and we will get a tube sent to blood bank in case she needs a blood transfusion.  We will see her back on Thursday for lab and injection.

## 2022-11-04 NOTE — Patient Instructions (Signed)
[facility]  Discharge Instructions: Thank you for choosing [facility] to provide your oncology and hematology care.   If you have a lab appointment with the Cancer Center, please go directly to the Cancer Center and check in at the registration area.   Wear comfortable clothing and clothing appropriate for easy access to any Portacath or PICC line.   We strive to give you quality time with your provider. You may need to reschedule your appointment if you arrive late (15 or more minutes).  Arriving late affects you and other patients whose appointments are after yours.  Also, if you miss three or more appointments without notifying the office, you may be dismissed from the clinic at the provider's discretion.      For prescription refill requests, have your pharmacy contact our office and allow 72 hours for refills to be completed.    Today you received the following chemotherapy and/or immunotherapy agents: Velcade & Darzalex       To help prevent nausea and vomiting after your treatment, we encourage you to take your nausea medication as directed.  BELOW ARE SYMPTOMS THAT SHOULD BE REPORTED IMMEDIATELY: *FEVER GREATER THAN 100.4 F (38 C) OR HIGHER *CHILLS OR SWEATING *NAUSEA AND VOMITING THAT IS NOT CONTROLLED WITH YOUR NAUSEA MEDICATION *UNUSUAL SHORTNESS OF BREATH *UNUSUAL BRUISING OR BLEEDING *URINARY PROBLEMS (pain or burning when urinating, or frequent urination) *BOWEL PROBLEMS (unusual diarrhea, constipation, pain near the anus) TENDERNESS IN MOUTH AND THROAT WITH OR WITHOUT PRESENCE OF ULCERS (sore throat, sores in mouth, or a toothache) UNUSUAL RASH, SWELLING OR PAIN  UNUSUAL VAGINAL DISCHARGE OR ITCHING   Items with * indicate a potential emergency and should be followed up as soon as possible or go to the Emergency Department if any problems should occur.  Please show the CHEMOTHERAPY ALERT CARD or IMMUNOTHERAPY ALERT  CARD at check-in to the Emergency Department and triage nurse.  Should you have questions after your visit or need to cancel or reschedule your appointment, please contact [facility]  Dept: [phone]  and follow the prompts.  Office hours are 8:00 a.m. to 4:30 p.m. Monday - Friday. Please note that voicemails left after 4:00 p.m. may not be returned until the following business day.  We are closed weekends and major holidays. You have access to a nurse at all times for urgent questions. Please call the main number to the clinic Dept: [phone] and follow the prompts.   For any non-urgent questions, you may also contact your provider using MyChart. We now offer e-Visits for anyone 18 and older to request care online for non-urgent symptoms. For details visit [email or web address].   Also download the MyChart app! Go to the app store, search "MyChart", open the app, select [facility], and log in with your MyChart username and password.   

## 2022-11-04 NOTE — Telephone Encounter (Signed)
Received faxed refill request from  [facility] for Revlimid 15 mg, take one daily for 14 days and then take 7 days off Patient pregnancy test negative on 11/04/22 Lenalidomide [facility] Auth#[id number] Revlimid refilled per provider OV note 11/04/22

## 2022-11-05 ENCOUNTER — Ambulatory Visit: Payer: 59

## 2022-11-05 ENCOUNTER — Other Ambulatory Visit: Payer: Self-pay | Admitting: Hematology and Oncology

## 2022-11-05 ENCOUNTER — Telehealth: Payer: Self-pay | Admitting: *Deleted

## 2022-11-05 ENCOUNTER — Encounter: Payer: Self-pay | Admitting: Hematology and Oncology

## 2022-11-05 DIAGNOSIS — D472 Monoclonal gammopathy: Secondary | ICD-10-CM

## 2022-11-05 DIAGNOSIS — C9000 Multiple myeloma not having achieved remission: Secondary | ICD-10-CM

## 2022-11-05 DIAGNOSIS — D649 Anemia, unspecified: Secondary | ICD-10-CM

## 2022-11-05 NOTE — Telephone Encounter (Signed)
-----   Message from Loa Socks, NP sent at 11/04/2022  4:48 PM EDT ----- Please call patient and see if it is okay with her if we add a lab onto prior to her appointment on Thursday, April 25.  I would like to get a CBC and hold tube for blood bank in case she needs a blood transfusion as her hemoglobin is slightly low.  Please route me the phone note--there was some miscommunication about something else in her treatment and I just want to make sure that we over communicate with her.  Thanks, LC

## 2022-11-05 NOTE — Telephone Encounter (Signed)
Spoke with patient. She is fine having labs at 0845 on Thursday. Message to scheduler. Labs ordered

## 2022-11-06 ENCOUNTER — Encounter: Payer: Self-pay | Admitting: Hematology and Oncology

## 2022-11-07 ENCOUNTER — Other Ambulatory Visit: Payer: 59

## 2022-11-07 ENCOUNTER — Inpatient Hospital Stay: Payer: 59

## 2022-11-07 ENCOUNTER — Other Ambulatory Visit: Payer: Self-pay

## 2022-11-07 VITALS — BP 108/62 | HR 82 | Temp 97.8°F | Resp 16 | Ht 66.0 in | Wt 233.8 lb

## 2022-11-07 DIAGNOSIS — D649 Anemia, unspecified: Secondary | ICD-10-CM

## 2022-11-07 DIAGNOSIS — D472 Monoclonal gammopathy: Secondary | ICD-10-CM

## 2022-11-07 DIAGNOSIS — Z5112 Encounter for antineoplastic immunotherapy: Secondary | ICD-10-CM | POA: Diagnosis not present

## 2022-11-07 DIAGNOSIS — C9000 Multiple myeloma not having achieved remission: Secondary | ICD-10-CM

## 2022-11-07 LAB — CBC WITH DIFFERENTIAL/PLATELET
Abs Immature Granulocytes: 0.08 10*3/uL — ABNORMAL HIGH (ref 0.00–0.07)
Basophils Absolute: 0 10*3/uL (ref 0.0–0.1)
Basophils Relative: 1 %
Eosinophils Absolute: 0.1 10*3/uL (ref 0.0–0.5)
Eosinophils Relative: 2 %
HCT: 27.2 % — ABNORMAL LOW (ref 36.0–46.0)
Hemoglobin: 8.3 g/dL — ABNORMAL LOW (ref 12.0–15.0)
Immature Granulocytes: 1 %
Lymphocytes Relative: 29 %
Lymphs Abs: 1.8 10*3/uL (ref 0.7–4.0)
MCH: 30.4 pg (ref 26.0–34.0)
MCHC: 30.5 g/dL (ref 30.0–36.0)
MCV: 99.6 fL (ref 80.0–100.0)
Monocytes Absolute: 1.3 10*3/uL — ABNORMAL HIGH (ref 0.1–1.0)
Monocytes Relative: 21 %
Neutro Abs: 2.9 10*3/uL (ref 1.7–7.7)
Neutrophils Relative %: 46 %
Platelets: 403 10*3/uL — ABNORMAL HIGH (ref 150–400)
RBC: 2.73 MIL/uL — ABNORMAL LOW (ref 3.87–5.11)
RDW: 18.5 % — ABNORMAL HIGH (ref 11.5–15.5)
WBC: 6.1 10*3/uL (ref 4.0–10.5)
nRBC: 2 % — ABNORMAL HIGH (ref 0.0–0.2)

## 2022-11-07 LAB — COMPREHENSIVE METABOLIC PANEL
ALT: 15 U/L (ref 0–44)
AST: 11 U/L — ABNORMAL LOW (ref 15–41)
Albumin: 2.7 g/dL — ABNORMAL LOW (ref 3.5–5.0)
Alkaline Phosphatase: 56 U/L (ref 38–126)
Anion gap: 0 — ABNORMAL LOW (ref 5–15)
BUN: 16 mg/dL (ref 6–20)
CO2: 29 mmol/L (ref 22–32)
Calcium: 8.1 mg/dL — ABNORMAL LOW (ref 8.9–10.3)
Chloride: 105 mmol/L (ref 98–111)
Creatinine, Ser: 0.64 mg/dL (ref 0.44–1.00)
GFR, Estimated: >60 mL/min (ref >60)
Glucose, Bld: 98 mg/dL (ref 70–99)
Potassium: 4.1 mmol/L (ref 3.5–5.1)
Sodium: 134 mmol/L — ABNORMAL LOW (ref 135–145)
Total Bilirubin: 0.3 mg/dL (ref 0.3–1.2)
Total Protein: 8.8 g/dL — ABNORMAL HIGH (ref 6.5–8.1)

## 2022-11-07 LAB — SAMPLE TO BLOOD BANK

## 2022-11-07 MED ORDER — BORTEZOMIB CHEMO SQ INJECTION 3.5 MG (2.5MG/ML)
1.3000 mg/m2 | Freq: Once | INTRAMUSCULAR | Status: AC
  Administered 2022-11-07: 2.75 mg via SUBCUTANEOUS
  Filled 2022-11-07: qty 1.1

## 2022-11-07 MED ORDER — PROCHLORPERAZINE MALEATE 10 MG PO TABS
10.0000 mg | ORAL_TABLET | Freq: Once | ORAL | Status: AC
  Administered 2022-11-07: 10 mg via ORAL
  Filled 2022-11-07: qty 1

## 2022-11-07 NOTE — Patient Instructions (Signed)
[facility]  Discharge Instructions: Thank you for choosing [facility] to provide your oncology and hematology care.   If you have a lab appointment with the Cancer Center, please go directly to the Cancer Center and check in at the registration area.   Wear comfortable clothing and clothing appropriate for easy access to any Portacath or PICC line.   We strive to give you quality time with your provider. You may need to reschedule your appointment if you arrive late (15 or more minutes).  Arriving late affects you and other patients whose appointments are after yours.  Also, if you miss three or more appointments without notifying the office, you may be dismissed from the clinic at the provider's discretion.      For prescription refill requests, have your pharmacy contact our office and allow 72 hours for refills to be completed.    Today you received the following chemotherapy and/or immunotherapy agents: Velcade      To help prevent nausea and vomiting after your treatment, we encourage you to take your nausea medication as directed.  BELOW ARE SYMPTOMS THAT SHOULD BE REPORTED IMMEDIATELY: *FEVER GREATER THAN 100.4 F (38 C) OR HIGHER *CHILLS OR SWEATING *NAUSEA AND VOMITING THAT IS NOT CONTROLLED WITH YOUR NAUSEA MEDICATION *UNUSUAL SHORTNESS OF BREATH *UNUSUAL BRUISING OR BLEEDING *URINARY PROBLEMS (pain or burning when urinating, or frequent urination) *BOWEL PROBLEMS (unusual diarrhea, constipation, pain near the anus) TENDERNESS IN MOUTH AND THROAT WITH OR WITHOUT PRESENCE OF ULCERS (sore throat, sores in mouth, or a toothache) UNUSUAL RASH, SWELLING OR PAIN  UNUSUAL VAGINAL DISCHARGE OR ITCHING   Items with * indicate a potential emergency and should be followed up as soon as possible or go to the Emergency Department if any problems should occur.  Please show the CHEMOTHERAPY ALERT CARD or IMMUNOTHERAPY ALERT CARD at  check-in to the Emergency Department and triage nurse.  Should you have questions after your visit or need to cancel or reschedule your appointment, please contact [facility]  Dept: [phone]  and follow the prompts.  Office hours are 8:00 a.m. to 4:30 p.m. Monday - Friday. Please note that voicemails left after 4:00 p.m. may not be returned until the following business day.  We are closed weekends and major holidays. You have access to a nurse at all times for urgent questions. Please call the main number to the clinic Dept: [phone] and follow the prompts.   For any non-urgent questions, you may also contact your provider using MyChart. We now offer e-Visits for anyone 18 and older to request care online for non-urgent symptoms. For details visit [email or web address].   Also download the MyChart app! Go to the app store, search "MyChart", open the app, select [facility], and log in with your MyChart username and password.  Rehydration, Adult Rehydration is the replacement of fluids, salts, and minerals in the body (electrolytes) that are lost during dehydration. Dehydration is when there is not enough water or other fluids in the body. This happens when you lose more fluids than you take in. Common causes of dehydration include: Not drinking enough fluids. This can occur when you are ill or doing activities that require a lot of energy, especially in hot weather. Conditions that cause loss of water or other fluids. These include diarrhea, vomiting, sweating, and urinating a lot. Other illnesses, such as fever or infection. Certain medicines, such as those that remove   excess fluid from the body (diuretics). Symptoms of mild or moderate dehydration may include thirst, dry lips and mouth, and dizziness. Symptoms of severe dehydration may include increased heart rate, confusion, fainting, and not urinating. In severe cases, you may need to get fluids  through an IV at the hospital. For mild or moderate cases, you can usually rehydrate at home by drinking certain fluids as told by your health care provider. What are the risks? Your health care provider will talk with you about risks. Your health care provider will talk with you about risks. This may include taking in too much fluid (overhydration). This is rare. Overhydration can cause an imbalance of electrolytes in the body, kidney failure, or a decrease in salt (sodium) levels in the body. Supplies needed: You will need an oral rehydration solution (ORS) if your health care provider tells you to use one. This is a drink to treat dehydration. It can be found in pharmacies and retail stores. How to rehydrate Fluids Follow instructions from your health care provider about what to drink. The kind of fluid and the amount you should drink depend on your condition. In general, you should choose drinks that you prefer. If told by your health care provider, drink an ORS. Make an ORS by following instructions on the package. Start by drinking small amounts, about  cup (120 mL) every 5-10 minutes. Slowly increase how much you drink until you have taken in the amount recommended by your health care provider. Drink enough clear fluids to keep your urine pale yellow. If you were told to drink an ORS, finish it first, then start slowly drinking other clear fluids. Drink fluids such as: Water. This includes sparkling and flavored water. Drinking only water can lead to having too little sodium in your body (hyponatremia). Follow the advice of your health care provider. Water from ice chips you suck on. Fruit juice with water added to it (diluted). Sports drinks. Hot or cold herbal teas. Broth-based soups. Milk or milk products. Food Follow instructions from your health care provider about what to eat while you rehydrate. Your health care provider may recommend that you slowly begin eating regular foods in  small amounts. Eat foods that contain a healthy balance of electrolytes, such as bananas, oranges, potatoes, tomatoes, and spinach. Avoid foods that are greasy or contain a lot of sugar. In some cases, you may get nutrition through a feeding tube that is passed through your nose and into your stomach (nasogastric tube, or NG tube). This may be done if you have uncontrolled vomiting or diarrhea. Drinks to avoid  Certain drinks may make dehydration worse. While you rehydrate, avoid drinking alcohol. How to tell if you are recovering from dehydration You may be getting better if: You are urinating more often than before you started rehydrating. Your urine is pale yellow. Your energy level improves. You vomit less often. You have diarrhea less often. Your appetite improves or returns to normal. You feel less dizzy or light-headed. Your skin tone and color start to look more normal. Follow these instructions at home: Take over-the-counter and prescription medicines only as told by your health care provider. Do not take sodium tablets. Doing this can lead to having too much sodium in your body (hypernatremia). Contact a health care provider if: You continue to have symptoms of mild or moderate dehydration, such as: Thirst. Dry lips. Slightly dry mouth. Dizziness. Dark urine or less urine than normal. Muscle cramps. You continue to vomit or have   diarrhea. Get help right away if: You have symptoms of dehydration that get worse. You have a fever. You have a severe headache. You have been vomiting and have problems, such as: Your vomiting gets worse or does not go away. Your vomit includes blood or green matter (bile). You cannot eat or drink without vomiting. You have problems with urination or bowel movements, such as: Diarrhea that gets worse or does not go away. Blood in your stool (feces). This may cause stool to look black and tarry. Not urinating, or urinating only a small amount  of very dark urine, within 6-8 hours. You have trouble breathing. You have symptoms that get worse with treatment. These symptoms may be an emergency. Get help right away. Call 911. Do not wait to see if the symptoms will go away. Do not drive yourself to the hospital. This information is not intended to replace advice given to you by your health care provider. Make sure you discuss any questions you have with your health care provider. Document Revised: 11/12/2021 Document Reviewed: 11/12/2021 Elsevier Patient Education  2023 [employer]   

## 2022-11-08 ENCOUNTER — Other Ambulatory Visit: Payer: 59

## 2022-11-08 ENCOUNTER — Ambulatory Visit: Payer: 59

## 2022-11-08 ENCOUNTER — Other Ambulatory Visit: Payer: Self-pay

## 2022-11-08 ENCOUNTER — Ambulatory Visit: Payer: 59 | Admitting: Hematology and Oncology

## 2022-11-08 MED FILL — Dexamethasone Sodium Phosphate Inj 100 MG/10ML: INTRAMUSCULAR | Qty: 2 | Status: AC

## 2022-11-11 ENCOUNTER — Inpatient Hospital Stay: Payer: 59

## 2022-11-11 ENCOUNTER — Other Ambulatory Visit: Payer: 59

## 2022-11-11 ENCOUNTER — Inpatient Hospital Stay (HOSPITAL_BASED_OUTPATIENT_CLINIC_OR_DEPARTMENT_OTHER): Payer: 59 | Admitting: Hematology and Oncology

## 2022-11-11 VITALS — BP 113/61 | HR 91 | Temp 98.1°F | Resp 18 | Ht 66.0 in | Wt 230.8 lb

## 2022-11-11 VITALS — BP 121/73 | HR 94 | Temp 98.4°F | Resp 17

## 2022-11-11 DIAGNOSIS — D472 Monoclonal gammopathy: Secondary | ICD-10-CM

## 2022-11-11 DIAGNOSIS — C9000 Multiple myeloma not having achieved remission: Secondary | ICD-10-CM | POA: Diagnosis not present

## 2022-11-11 DIAGNOSIS — Z5112 Encounter for antineoplastic immunotherapy: Secondary | ICD-10-CM | POA: Diagnosis not present

## 2022-11-11 LAB — CBC WITH DIFFERENTIAL (CANCER CENTER ONLY)
Abs Immature Granulocytes: 0.11 10*3/uL — ABNORMAL HIGH (ref 0.00–0.07)
Basophils Absolute: 0 10*3/uL (ref 0.0–0.1)
Basophils Relative: 0 %
Eosinophils Absolute: 0.1 10*3/uL (ref 0.0–0.5)
Eosinophils Relative: 1 %
HCT: 27.6 % — ABNORMAL LOW (ref 36.0–46.0)
Hemoglobin: 8.6 g/dL — ABNORMAL LOW (ref 12.0–15.0)
Immature Granulocytes: 1 %
Lymphocytes Relative: 26 %
Lymphs Abs: 2.8 10*3/uL (ref 0.7–4.0)
MCH: 30.4 pg (ref 26.0–34.0)
MCHC: 31.2 g/dL (ref 30.0–36.0)
MCV: 97.5 fL (ref 80.0–100.0)
Monocytes Absolute: 2.9 10*3/uL — ABNORMAL HIGH (ref 0.1–1.0)
Monocytes Relative: 27 %
Neutro Abs: 4.9 10*3/uL (ref 1.7–7.7)
Neutrophils Relative %: 45 %
Platelet Count: 302 10*3/uL (ref 150–400)
RBC: 2.83 MIL/uL — ABNORMAL LOW (ref 3.87–5.11)
RDW: 19 % — ABNORMAL HIGH (ref 11.5–15.5)
WBC Count: 10.8 10*3/uL — ABNORMAL HIGH (ref 4.0–10.5)
nRBC: 0.3 % — ABNORMAL HIGH (ref 0.0–0.2)

## 2022-11-11 LAB — CMP (CANCER CENTER ONLY)
ALT: 13 U/L (ref 0–44)
AST: 11 U/L — ABNORMAL LOW (ref 15–41)
Albumin: 2.8 g/dL — ABNORMAL LOW (ref 3.5–5.0)
Alkaline Phosphatase: 54 U/L (ref 38–126)
Anion gap: 4 — ABNORMAL LOW (ref 5–15)
BUN: 22 mg/dL — ABNORMAL HIGH (ref 6–20)
CO2: 26 mmol/L (ref 22–32)
Calcium: 8 mg/dL — ABNORMAL LOW (ref 8.9–10.3)
Chloride: 106 mmol/L (ref 98–111)
Creatinine: 0.7 mg/dL (ref 0.44–1.00)
GFR, Estimated: >60 mL/min (ref >60)
Glucose, Bld: 79 mg/dL (ref 70–99)
Potassium: 3.8 mmol/L (ref 3.5–5.1)
Sodium: 136 mmol/L (ref 135–145)
Total Bilirubin: 0.4 mg/dL (ref 0.3–1.2)
Total Protein: 8.6 g/dL — ABNORMAL HIGH (ref 6.5–8.1)

## 2022-11-11 MED ORDER — SODIUM CHLORIDE 0.9% FLUSH: 10.0000 mL | INTRAVENOUS | Status: DC | PRN

## 2022-11-11 MED ORDER — SODIUM CHLORIDE 0.9 % IV SOLN
20.0000 mg | Freq: Once | INTRAVENOUS | Status: AC
  Administered 2022-11-11: 20 mg via INTRAVENOUS
  Filled 2022-11-11: qty 20

## 2022-11-11 MED ORDER — HEPARIN SOD (PORK) LOCK FLUSH 100 UNIT/ML IV SOLN: 500.0000 [IU] | Freq: Once | INTRAVENOUS | Status: DC | PRN

## 2022-11-11 MED ORDER — BORTEZOMIB CHEMO SQ INJECTION 3.5 MG (2.5MG/ML)
1.3000 mg/m2 | Freq: Once | INTRAMUSCULAR | Status: AC
  Administered 2022-11-11: 2.75 mg via SUBCUTANEOUS
  Filled 2022-11-11: qty 1.1

## 2022-11-11 MED ORDER — PROCHLORPERAZINE MALEATE 10 MG PO TABS
10.0000 mg | ORAL_TABLET | Freq: Four times a day (QID) | ORAL | Status: DC | PRN
  Administered 2022-11-11: 10 mg via ORAL
  Filled 2022-11-11: qty 1

## 2022-11-11 MED ORDER — SODIUM CHLORIDE 0.9 % IV SOLN
16.0000 mg/kg | Freq: Once | INTRAVENOUS | Status: AC
  Administered 2022-11-11: 1600 mg via INTRAVENOUS
  Filled 2022-11-11: qty 80

## 2022-11-11 MED ORDER — ACETAMINOPHEN 325 MG PO TABS
650.0000 mg | ORAL_TABLET | Freq: Once | ORAL | Status: AC
  Administered 2022-11-11: 650 mg via ORAL
  Filled 2022-11-11: qty 2

## 2022-11-11 MED ORDER — DIPHENHYDRAMINE HCL 25 MG PO CAPS
50.0000 mg | ORAL_CAPSULE | Freq: Once | ORAL | Status: AC
  Administered 2022-11-11: 50 mg via ORAL
  Filled 2022-11-11: qty 2

## 2022-11-11 MED ORDER — SODIUM CHLORIDE 0.9 % IV SOLN: Freq: Once | INTRAVENOUS | Status: AC

## 2022-11-11 NOTE — Progress Notes (Signed)
[facility] CONSULT NOTE  Patient Care Team: [name], MD as PCP - General (Family Medicine) Christell Constant, MD as PCP - Cardiology (Cardiology) Maxie Better, MD as Consulting Physician (Obstetrics and Gynecology)  CHIEF COMPLAINTS/PURPOSE OF CONSULTATION:  IgG Kappa Multiple myeloma  ASSESSMENT & PLAN:   This is a very pleasant 47 year old female patient with a past medical history significant for gestational diabetes and trigeminal neuralgia corrected with surgery, hypertension referred to hematology for evaluation and recommendations regarding severe normocytic normochromic anemia.   We have reviewed her labs which showed severe anemia and elevated total protein.  SPEP showed 8.4 g/dL, IgG kappa.  Kappa lambda ratio at 110.89 No evidence of endorgan damage so far.   BMB confirmed Ig G kappa MM, bone survey pending scheduled for Wednesday. FISH for multiple myeloma showed a gain of chromosome 11, characterized as standard risk.  Cytogenetics showed normal female karyotype RISS-II, median survival 42 months.  We started her on daratumumab with Velcade and dexamethasone prior to her FISH results being available.  Now that she is deemed to be standard risk, we can consider doing VRD without daratumumab which is a category 1 preferred regimen for transplant candidates. We have also referred to [facility] transplant for further evaluation since she is a transplant candidate.  She started cycle 1 day 1 of Darzalex/Velcade with dexamethasone on 09/20/2022.    From second cycle, we transitioned to VRD since she is a standard risk. She then was seen in [facility], they recommended that we transition back to dara VRD.  Hence we restarted Dara VRD. She is doing really well. She denies any complaints today. Back pain is well controlled. No other health issues. She would like to have a port placed for easy access. No concerns on Physical exam. Ok to proceed with treatment  as planned.   Dentist: [facility] L[facility]a [phone]  I have discussed this in detail with the patient.  She is on board with all the recommendations.  She understands that auto transplant is standard of care for multiple myeloma after induction chemotherapy RTC as scheduled.   HISTORY OF PRESENTING ILLNESS:  Kara Medina 47 y.o. female is here because of severe anemia.  This is a very pleasant 47 yr old female patient with PMH of HTN referred to hematology for severe anemia.  Lab evaluation consistent with IgG kappa multiple myeloma.  Bone marrow biopsy also confirms suspected diagnosis of multiple myeloma.  Bone survey pending.  FISH standard risk, cytogenetics normal female karyotype  She is doing really well.  She denies any complaints at all.  She does not have the back pain anymore.  She has no adverse effects with the medication except for tremor which is likely related to the steroids.  She is on board with the plan.  She has seen the dentist and she needs a crown placed but apparently there are no contraindications to proceed with Zometa.  Will also try to obtain the official dental clearance.  Rest of the pertinent 10 point ROS reviewed and negative   MEDICAL HISTORY:  Past Medical History:  Diagnosis Date   Anemia    Gestational diabetes    diet controlled   Postpartum care following vaginal delivery (7/27) 02/09/2016   Pregnancy induced hypertension    Trigeminal neuralgia    2012 corrected with surgery    SURGICAL HISTORY: Past Surgical History:  Procedure Laterality Date   APPENDECTOMY  2008   TRIGEMINAL NERVE DECOMPRESSION  2012    SOCIAL HISTORY: Social History   Socioeconomic History   Marital status: Married    Spouse name: Not on file   Number of children: Not on file   Years of education: Not on file   Highest education level: Bachelor's degree (e.g., BA, AB, BS)  Occupational History   Not on file  Tobacco Use   Smoking status: Never    Smokeless tobacco: Never  Vaping Use   Vaping Use: Never used  Substance and Sexual Activity   Alcohol use: No   Drug use: No   Sexual activity: Yes  Other Topics Concern   Not on file  Social History Narrative   Not on file   Social Determinants of Health   Financial Resource Strain: Low Risk  (08/23/2022)   Overall Financial Resource Strain (CARDIA)    Difficulty of Paying Living Expenses: Not hard at all  Food Insecurity: No Food Insecurity (08/23/2022)   Hunger Vital Sign    Worried About Running Out of Food in the Last Year: Never true    Ran Out of Food in the Last Year: Never true  Transportation Needs: No Transportation Needs (08/23/2022)   PRAPARE - Administrator, Civil Service (Medical): No    Lack of Transportation (Non-Medical): No  Physical Activity: Insufficiently Active (08/23/2022)   Exercise Vital Sign    Days of Exercise per Week: 2 days    Minutes of Exercise per Session: 30 min  Stress: Stress Concern Present (08/23/2022)   [employer] of Occupational Health - Occupational Stress Questionnaire    Feeling of Stress : To some extent  Social Connections: Unknown (08/23/2022)   Social Connection and Isolation Panel [NHANES]    Frequency of Communication with Friends and Family: More than three times a week    Frequency of Social Gatherings with Friends and Family: Once a week    Attends Religious Services: Patient declined    Database administrator or Organizations: Yes    Attends Engineer, structural: More than 4 times per year    Marital Status: Married  Catering manager Violence: Not on file    FAMILY HISTORY: Family History  Problem Relation Age of Onset   Cancer Other    Hypertension Other    Healthy Father    Cancer Paternal Grandfather        stomach, lung   Hypertension Mother    Breast cancer Mother 20   Hypertension Maternal Grandmother    Arthritis Maternal Grandmother    Hypertension Maternal Grandfather    Other  Brother        ?prediabetic   Diabetes Paternal Grandmother    Hypertension Paternal Grandmother    Autism Son     ALLERGIES:  is allergic to amoxicillin.  MEDICATIONS:  Current Outpatient Medications  Medication Sig Dispense Refill   amLODipine (NORVASC) 5 MG tablet Take 1 tablet (5 mg total) by mouth daily. 90 tablet 3   Blood Pressure Monitoring (BLOOD PRESSURE CUFF) MISC 1 Device by Does not apply route daily. 1 each 0   cyclobenzaprine (FLEXERIL) 5 MG tablet Take 1 tablet (5 mg total) by mouth 3 (three) times daily as needed for muscle spasms. 30 tablet 0   ibuprofen (ADVIL) 200 MG tablet Take 400 mg by mouth every 6 (six) hours as needed for headache or moderate pain.     labetalol (NORMODYNE) 200 MG tablet Take 1 tablet (200 mg total) by mouth 2 (two) times daily.  180 tablet 3   lenalidomide (REVLIMID) 15 MG capsule Take 1 capsule (15 mg total) by mouth daily. Take for 14 days, hold for 7 days. Repeat every 21 days. 14 capsule 0   Multiple Vitamin (MULTIVITAMIN ADULT PO) Take by mouth.     traMADol (ULTRAM) 50 MG tablet Take 1 tablet (50 mg total) by mouth every 12 (twelve) hours as needed. 30 tablet 0   No current facility-administered medications for this visit.     PHYSICAL EXAMINATION: ECOG PERFORMANCE STATUS: 0 - Asymptomatic  Vitals:   11/11/22 0914  BP: 113/61  Pulse: 91  Resp: 18  Temp: 98.1 F (36.7 C)  SpO2: 100%     Filed Weights   11/11/22 0914  Weight: 230 lb 12.8 oz (104.7 kg)     Physical Exam Constitutional:      Appearance: Normal appearance.  Cardiovascular:     Rate and Rhythm: Normal rate and regular rhythm.     Pulses: Normal pulses.     Heart sounds: Normal heart sounds.  Pulmonary:     Effort: Pulmonary effort is normal.     Breath sounds: Normal breath sounds.  Musculoskeletal:     Cervical back: Normal range of motion and neck supple. No rigidity.  Lymphadenopathy:     Cervical: No cervical adenopathy.  Skin:    General: Skin  is warm and dry.  Neurological:     General: No focal deficit present.     Mental Status: She is alert.       LABORATORY DATA:  I have reviewed the data as listed Lab Results  Component Value Date   WBC 10.8 (H) 11/11/2022   HGB 8.6 (L) 11/11/2022   HCT 27.6 (L) 11/11/2022   MCV 97.5 11/11/2022   PLT 302 11/11/2022     Chemistry      Component Value Date/Time   NA 136 11/11/2022 0849   K 3.8 11/11/2022 0849   CL 106 11/11/2022 0849   CO2 26 11/11/2022 0849   BUN 22 (H) 11/11/2022 0849   CREATININE 0.70 11/11/2022 0849   CREATININE 0.95 08/23/2022 1631      Component Value Date/Time   CALCIUM 8.0 (L) 11/11/2022 0849   ALKPHOS 54 11/11/2022 0849   AST 11 (L) 11/11/2022 0849   ALT 13 11/11/2022 0849   BILITOT 0.4 11/11/2022 0849        RADIOGRAPHIC STUDIES: I have personally reviewed the radiological images as listed and agreed with the findings in the report. CT Head Wo Contrast  Result Date: 10/21/2022 CLINICAL DATA:  Head trauma lightheaded syncope EXAM: CT HEAD WITHOUT CONTRAST TECHNIQUE: Contiguous axial images were obtained from the base of the skull through the vertex without intravenous contrast. RADIATION DOSE REDUCTION: This exam was performed according to the departmental dose-optimization program which includes automated exposure control, adjustment of the mA and/or kV according to patient size and/or use of iterative reconstruction technique. COMPARISON:  None Available. FINDINGS: Brain: No acute territorial infarction, hemorrhage or intracranial mass. The ventricles are nonenlarged. Vascular: No hyperdense vessel or unexpected calcification. Skull: Normal. Negative for fracture or focal lesion. Sinuses/Orbits: Moderate mucosal thickening in the sinuses with fluid level in the right maxillary sinus Other: None IMPRESSION: 1. Negative non contrasted CT appearance of the brain. 2. Acute on chronic sinusitis. Electronically Signed   By: Jasmine Pang M.D.   On:  10/21/2022 17:59   MM 3D SCREEN BREAST BILATERAL  Result Date: 10/17/2022 CLINICAL DATA:  Screening. EXAM: DIGITAL SCREENING BILATERAL  MAMMOGRAM WITH TOMOSYNTHESIS AND CAD TECHNIQUE: Bilateral screening digital craniocaudal and mediolateral oblique mammograms were obtained. Bilateral screening digital breast tomosynthesis was performed. The images were evaluated with computer-aided detection. COMPARISON:  Previous exam(s). ACR Breast Density Category b: There are scattered areas of fibroglandular density. FINDINGS: There are no findings suspicious for malignancy. IMPRESSION: No mammographic evidence of malignancy. A result letter of this screening mammogram will be mailed directly to the patient. RECOMMENDATION: Screening mammogram in one year. (Code:SM-B-01Y) BI-RADS CATEGORY  1: Negative. Electronically Signed   By: Norva Pavlov M.D.   On: 10/17/2022 07:46    All questions were answered. The patient knows to call the clinic with any problems, questions or concerns. I spent 30 minutes in the care of this patient including H and P, review of records, counseling and coordination of care.     Rachel Moulds, MD 11/11/2022 9:38 AM

## 2022-11-11 NOTE — Patient Instructions (Signed)
[place] [facility]  Discharge Instructions: Thank you for choosing [facility] to provide your oncology and hematology care.   If you have a lab appointment with the Cancer Center, please go directly to the Cancer Center and check in at the registration area.   Wear comfortable clothing and clothing appropriate for easy access to any Portacath or PICC line.   We strive to give you quality time with your provider. You may need to reschedule your appointment if you arrive late (15 or more minutes).  Arriving late affects you and other patients whose appointments are after yours.  Also, if you miss three or more appointments without notifying the office, you may be dismissed from the clinic at the provider's discretion.      For prescription refill requests, have your pharmacy contact our office and allow 72 hours for refills to be completed.    Today you received the following chemotherapy and/or immunotherapy agents: Daratumumab, Velcade.       To help prevent nausea and vomiting after your treatment, we encourage you to take your nausea medication as directed.  BELOW ARE SYMPTOMS THAT SHOULD BE REPORTED IMMEDIATELY: *FEVER GREATER THAN 100.4 F (38 C) OR HIGHER *CHILLS OR SWEATING *NAUSEA AND VOMITING THAT IS NOT CONTROLLED WITH YOUR NAUSEA MEDICATION *UNUSUAL SHORTNESS OF BREATH *UNUSUAL BRUISING OR BLEEDING *URINARY PROBLEMS (pain or burning when urinating, or frequent urination) *BOWEL PROBLEMS (unusual diarrhea, constipation, pain near the anus) TENDERNESS IN MOUTH AND THROAT WITH OR WITHOUT PRESENCE OF ULCERS (sore throat, sores in mouth, or a toothache) UNUSUAL RASH, SWELLING OR PAIN  UNUSUAL VAGINAL DISCHARGE OR ITCHING   Items with * indicate a potential emergency and should be followed up as soon as possible or go to the Emergency Department if any problems should occur.  Please show the CHEMOTHERAPY ALERT CARD or IMMUNOTHERAPY ALERT  CARD at check-in to the Emergency Department and triage nurse.  Should you have questions after your visit or need to cancel or reschedule your appointment, please contact [place] [facility]  Dept: [phone]  and follow the prompts.  Office hours are 8:00 a.m. to 4:30 p.m. Monday - Friday. Please note that voicemails left after 4:00 p.m. may not be returned until the following business day.  We are closed weekends and major holidays. You have access to a nurse at all times for urgent questions. Please call the main number to the clinic Dept: [phone] and follow the prompts.   For any non-urgent questions, you may also contact your provider using MyChart. We now offer e-Visits for anyone 25 and older to request care online for non-urgent symptoms. For details visit [email or web address].   Also download the MyChart app! Go to the app store, search "MyChart", open the app, select [place], and log in with your MyChart username and password.

## 2022-11-12 ENCOUNTER — Ambulatory Visit: Payer: 59

## 2022-11-14 ENCOUNTER — Ambulatory Visit: Payer: 59

## 2022-11-14 ENCOUNTER — Inpatient Hospital Stay: Payer: 59 | Attending: Hematology and Oncology

## 2022-11-14 ENCOUNTER — Ambulatory Visit: Payer: 59 | Attending: Internal Medicine | Admitting: Pharmacist

## 2022-11-14 VITALS — BP 112/78 | HR 103

## 2022-11-14 VITALS — BP 135/77 | HR 94 | Temp 98.4°F | Resp 18 | Ht 66.0 in | Wt 230.2 lb

## 2022-11-14 DIAGNOSIS — Z801 Family history of malignant neoplasm of trachea, bronchus and lung: Secondary | ICD-10-CM | POA: Insufficient documentation

## 2022-11-14 DIAGNOSIS — Z79899 Other long term (current) drug therapy: Secondary | ICD-10-CM | POA: Insufficient documentation

## 2022-11-14 DIAGNOSIS — Z5112 Encounter for antineoplastic immunotherapy: Secondary | ICD-10-CM | POA: Diagnosis present

## 2022-11-14 DIAGNOSIS — Z800 Family history of malignant neoplasm of digestive organs: Secondary | ICD-10-CM | POA: Insufficient documentation

## 2022-11-14 DIAGNOSIS — I10 Essential (primary) hypertension: Secondary | ICD-10-CM | POA: Diagnosis not present

## 2022-11-14 DIAGNOSIS — C9000 Multiple myeloma not having achieved remission: Secondary | ICD-10-CM | POA: Insufficient documentation

## 2022-11-14 DIAGNOSIS — Z803 Family history of malignant neoplasm of breast: Secondary | ICD-10-CM | POA: Diagnosis not present

## 2022-11-14 DIAGNOSIS — D472 Monoclonal gammopathy: Secondary | ICD-10-CM

## 2022-11-14 DIAGNOSIS — J101 Influenza due to other identified influenza virus with other respiratory manifestations: Secondary | ICD-10-CM | POA: Insufficient documentation

## 2022-11-14 MED ORDER — PROCHLORPERAZINE MALEATE 10 MG PO TABS
10.0000 mg | ORAL_TABLET | Freq: Once | ORAL | Status: AC
  Administered 2022-11-14: 10 mg via ORAL
  Filled 2022-11-14: qty 1

## 2022-11-14 MED ORDER — SODIUM CHLORIDE 0.9 % IV SOLN: Freq: Once | INTRAVENOUS | Status: AC

## 2022-11-14 MED ORDER — BORTEZOMIB CHEMO SQ INJECTION 3.5 MG (2.5MG/ML)
1.3000 mg/m2 | Freq: Once | INTRAMUSCULAR | Status: AC
  Administered 2022-11-14: 2.75 mg via SUBCUTANEOUS
  Filled 2022-11-14: qty 1.1

## 2022-11-14 MED ORDER — SODIUM CHLORIDE 0.9 % IV SOLN: INTRAVENOUS | Status: DC

## 2022-11-14 MED ORDER — ZOLEDRONIC ACID 4 MG/100ML IV SOLN
4.0000 mg | Freq: Once | INTRAVENOUS | Status: AC
  Administered 2022-11-14: 4 mg via INTRAVENOUS
  Filled 2022-11-14: qty 100

## 2022-11-14 NOTE — Patient Instructions (Addendum)
Summary of today's discussion  Continue amlodipine 5mg  daily and labetalol 200mg  twice a day  2. Make sure you rest 5 min before checking blood pressure  3. Increase walking  4. Call me if blood pressure is still consistently >130/80 even after resting   5.   Your blood pressure goal is <130/80  To check your pressure at home you will need to:  1. Sit up in a chair, with feet flat on the floor and back supported. Do not cross your ankles or legs. 2. Rest your left arm so that the cuff is about heart level. If the cuff goes on your upper arm,  then just relax the arm on the table, arm of the chair or your lap. If you have a wrist cuff, we  suggest relaxing your wrist against your chest (think of it as Pledging the Flag with the  wrong arm).  3. Place the cuff snugly around your arm, about 1 inch above the crook of your elbow. The  cords should be inside the groove of your elbow.  4. Sit quietly, with the cuff in place, for about 5 minutes. After that 5 minutes press the power  button to start a reading. 5. Do not talk or move while the reading is taking place.  6. Record your readings on a sheet of paper. Although most cuffs have a memory, it is often  easier to see a pattern developing when the numbers are all in front of you.  7. You can repeat the reading after 1-3 minutes if it is recommended  Make sure your bladder is empty and you have not had caffeine or tobacco within the last 30 min  Always bring your blood pressure log with you to your appointments. If you have not brought your monitor in to be double checked for accuracy, please bring it to your next appointment.  You can find a list of validated (accurate) blood pressure cuffs at [email or web address].no   Important lifestyle changes to control high blood pressure  Intervention  Effect on the BP  Lose extra pounds and watch your waistline Weight loss is one of the most effective lifestyle changes for controlling blood pressure.  If you're overweight or obese, losing even a small amount of weight can help reduce blood pressure. Blood pressure might go down by about 1 millimeter of mercury (mm Hg) with each kilogram (about 2.2 pounds) of weight lost.  Exercise regularly As a general goal, aim for at least 30 minutes of moderate physical activity every day. Regular physical activity can lower high blood pressure by about 5 to 8 mm Hg.  Eat a healthy diet Eating a diet rich in whole grains, fruits, vegetables, and low-fat dairy products and low in saturated fat and cholesterol. A healthy diet can lower high blood pressure by up to 11 mm Hg.  Reduce salt (sodium) in your diet Even a small reduction of sodium in the diet can improve heart health and reduce high blood pressure by about 5 to 6 mm Hg.  Limit alcohol One drink equals 12 ounces of beer, 5 ounces of wine, or 1.5 ounces of 80-proof liquor.  Limiting alcohol to less than one drink a day for women or two drinks a day for men can help lower blood pressure by about 4 mm Hg.   Please call me at [phone] with any questions.

## 2022-11-14 NOTE — Progress Notes (Signed)
Patient ID: Kara Medina                 DOB: Mar 08, 1976                      MRN: [id number]      HPI: Kara Medina is a 47 y.o. female referred by Dr. Izora Ribas to HTN clinic. PMH is significant for HTN, gestational diabetes, anemia, multiple myeloma (Feb 2024)  At visit with Dr. Izora Ribas on 08/29/2022 patient reported not feeling well, having palpitations with light activity, fatigue. These symptoms had been occurring since Dec of 2023. Amlodipine 5mg  was added. New diagnosis of multiple myeloma on 09/12/2022.  At last visit with PharmD, BP was much improved. Labetalol 200mg  BID was started and metoprolol was stopped.   Patient presents today to hypertension clinic.  She reports feeling good.  No more palpitations at rest.  Still does sometimes have to slow down or stop if she has been walking for a while.  She will rest shortly and then resume.  Not exercising consistently.  Not having as frequent nosebleeds.  Has been using Vaseline, Zyrtec and try not to blow her nose too hard.  Patient reports being happy with her blood pressure medication changes.  Reports blood pressures in the 130s at home, but blood pressure at oncology office is generally 110s to 120s systolic.  Patient is not resting before checking blood pressure at home.  Current HTN meds: amlodipine 5mg , labetalol 200mg  twice a day BP goal: < 130/80  Family History: The patient's family history includes Arthritis in her maternal grandmother; Autism in her son; Breast cancer (age of onset: 76) in her mother; Cancer in her paternal grandfather and another family member; Diabetes in her paternal grandmother; Healthy in her father; Hypertension in her maternal grandfather, maternal grandmother, mother, paternal grandmother, and another family member; Other in her brother.   Social History: Never smoked, no alcohol use, no drug use.   Diet:  Water intake increased, cut back on fast food, eating more vegetables/greens   Protein: chicken, ground beef  Snack: almonds, cashews, apples   Exercise:   Not consistent, but does walk sometimes. Has to slow down sometimes to let her HR come down  Home BP readings:  130's/70's HR averages 80's   Wt Readings from Last 3 Encounters:  11/11/22 230 lb 12.8 oz (104.7 kg)  11/07/22 233 lb 12.8 oz (106.1 kg)  11/04/22 230 lb 8 oz (104.6 kg)   BP Readings from Last 3 Encounters:  11/14/22 112/78  11/11/22 121/73  11/11/22 113/61   Pulse Readings from Last 3 Encounters:  11/14/22 (!) 103  11/11/22 94  11/11/22 91    Renal function: Estimated Creatinine Clearance: 106.4 mL/min (by C-G formula based on SCr of 0.7 mg/dL).  Past Medical History:  Diagnosis Date   Anemia    Gestational diabetes    diet controlled   Postpartum care following vaginal delivery (7/27) 02/09/2016   Pregnancy induced hypertension    Trigeminal neuralgia    2012 corrected with surgery    Current Outpatient Medications on File Prior to Visit  Medication Sig Dispense Refill   amLODipine (NORVASC) 5 MG tablet Take 1 tablet (5 mg total) by mouth daily. 90 tablet 3   Blood Pressure Monitoring (BLOOD PRESSURE CUFF) MISC 1 Device by Does not apply route daily. 1 each 0   cyclobenzaprine (FLEXERIL) 5 MG tablet Take 1 tablet (5 mg total) by mouth 3 (three)  times daily as needed for muscle spasms. 30 tablet 0   ibuprofen (ADVIL) 200 MG tablet Take 400 mg by mouth every 6 (six) hours as needed for headache or moderate pain.     labetalol (NORMODYNE) 200 MG tablet Take 1 tablet (200 mg total) by mouth 2 (two) times daily. 180 tablet 3   lenalidomide (REVLIMID) 15 MG capsule Take 1 capsule (15 mg total) by mouth daily. Take for 14 days, hold for 7 days. Repeat every 21 days. 14 capsule 0   Multiple Vitamin (MULTIVITAMIN ADULT PO) Take by mouth.     traMADol (ULTRAM) 50 MG tablet Take 1 tablet (50 mg total) by mouth every 12 (twelve) hours as needed. 30 tablet 0   No current  facility-administered medications on file prior to visit.    Allergies  Allergen Reactions   Amoxicillin Hives    Has patient had a PCN reaction causing immediate rash, facial/tongue/throat swelling, SOB or lightheadedness with hypotension: Yes Has patient had a PCN reaction causing severe rash involving mucus membranes or skin necrosis: No Has patient had a PCN reaction that required hospitalization No Has patient had a PCN reaction occurring within the last 10 years: No If all of the above answers are "NO", then may proceed with Cephalosporin use.     Blood pressure 112/78, pulse (!) 103.   Assessment/Plan:  1. Hypertension -  HTN (hypertension) Assessment: Blood pressure well-controlled in both our clinic and oncology clinic Patient reports higher blood pressures at home but, patient is not resting prior to checking blood pressure.  She has an Omron upper arm cuff at home Exercises not consistent Patient under some stress as she is undergoing chemotherapy for multiple myeloma and also trying to sell her house  Plan: Continue amlodipine 5 mg daily and labetalol 200 mg twice a day Continue to check blood pressure at home making sure she rest 5 minutes before checking Patient to call me if blood pressure at home is consistently above 130/80 despite resting Increase exercise, start aerobic exercise on a consistent basis then add in at least 2 days of resistance training Follow-up as needed   Thank you,   Olene Floss, Pharm.D, BCPS, CPP [facility] Division of [facility] [address], [place], [place] [address]  Phone: [phone]; Fax: [phone]

## 2022-11-14 NOTE — Patient Instructions (Signed)
[facility]  Discharge Instructions: Thank you for choosing [facility] to provide your oncology and hematology care.   If you have a lab appointment with the Cancer Center, please go directly to the Cancer Center and check in at the registration area.   Wear comfortable clothing and clothing appropriate for easy access to any Portacath or PICC line.   We strive to give you quality time with your provider. You may need to reschedule your appointment if you arrive late (15 or more minutes).  Arriving late affects you and other patients whose appointments are after yours.  Also, if you miss three or more appointments without notifying the office, you may be dismissed from the clinic at the provider's discretion.      For prescription refill requests, have your pharmacy contact our office and allow 72 hours for refills to be completed.    Today you received the following chemotherapy and/or immunotherapy agents velcade      To help prevent nausea and vomiting after your treatment, we encourage you to take your nausea medication as directed.  BELOW ARE SYMPTOMS THAT SHOULD BE REPORTED IMMEDIATELY: *FEVER GREATER THAN 100.4 F (38 C) OR HIGHER *CHILLS OR SWEATING *NAUSEA AND VOMITING THAT IS NOT CONTROLLED WITH YOUR NAUSEA MEDICATION *UNUSUAL SHORTNESS OF BREATH *UNUSUAL BRUISING OR BLEEDING *URINARY PROBLEMS (pain or burning when urinating, or frequent urination) *BOWEL PROBLEMS (unusual diarrhea, constipation, pain near the anus) TENDERNESS IN MOUTH AND THROAT WITH OR WITHOUT PRESENCE OF ULCERS (sore throat, sores in mouth, or a toothache) UNUSUAL RASH, SWELLING OR PAIN  UNUSUAL VAGINAL DISCHARGE OR ITCHING   Items with * indicate a potential emergency and should be followed up as soon as possible or go to the Emergency Department if any problems should occur.  Please show the CHEMOTHERAPY ALERT CARD or IMMUNOTHERAPY ALERT CARD at check-in  to the Emergency Department and triage nurse.  Should you have questions after your visit or need to cancel or reschedule your appointment, please contact [facility]  Dept: [phone]  and follow the prompts.  Office hours are 8:00 a.m. to 4:30 p.m. Monday - Friday. Please note that voicemails left after 4:00 p.m. may not be returned until the following business day.  We are closed weekends and major holidays. You have access to a nurse at all times for urgent questions. Please call the main number to the clinic Dept: [phone] and follow the prompts.   For any non-urgent questions, you may also contact your provider using MyChart. We now offer e-Visits for anyone 18 and older to request care online for non-urgent symptoms. For details visit [email or web address].   Also download the MyChart app! Go to the app store, search "MyChart", open the app, select [facility], and log in with your MyChart username and password.   

## 2022-11-14 NOTE — Assessment & Plan Note (Signed)
Assessment: Blood pressure well-controlled in both our clinic and oncology clinic Patient reports higher blood pressures at home but, patient is not resting prior to checking blood pressure.  She has an Omron upper arm cuff at home Exercises not consistent Patient under some stress as she is undergoing chemotherapy for multiple myeloma and also trying to sell her house  Plan: Continue amlodipine 5 mg daily and labetalol 200 mg twice a day Continue to check blood pressure at home making sure she rest 5 minutes before checking Patient to call me if blood pressure at home is consistently above 130/80 despite resting Increase exercise, start aerobic exercise on a consistent basis then add in at least 2 days of resistance training Follow-up as needed

## 2022-11-15 MED FILL — Dexamethasone Sodium Phosphate Inj 100 MG/10ML: INTRAMUSCULAR | Qty: 2 | Status: AC

## 2022-11-18 ENCOUNTER — Inpatient Hospital Stay (HOSPITAL_BASED_OUTPATIENT_CLINIC_OR_DEPARTMENT_OTHER): Payer: 59 | Admitting: Adult Health

## 2022-11-18 ENCOUNTER — Encounter: Payer: Self-pay | Admitting: Adult Health

## 2022-11-18 ENCOUNTER — Inpatient Hospital Stay: Payer: 59

## 2022-11-18 ENCOUNTER — Other Ambulatory Visit: Payer: Self-pay

## 2022-11-18 ENCOUNTER — Other Ambulatory Visit: Payer: 59

## 2022-11-18 VITALS — BP 109/70 | HR 88 | Temp 97.9°F | Resp 18

## 2022-11-18 DIAGNOSIS — D472 Monoclonal gammopathy: Secondary | ICD-10-CM | POA: Diagnosis not present

## 2022-11-18 DIAGNOSIS — Z5112 Encounter for antineoplastic immunotherapy: Secondary | ICD-10-CM | POA: Diagnosis not present

## 2022-11-18 DIAGNOSIS — C9000 Multiple myeloma not having achieved remission: Secondary | ICD-10-CM

## 2022-11-18 LAB — CMP (CANCER CENTER ONLY)
ALT: 18 U/L (ref 0–44)
AST: 12 U/L — ABNORMAL LOW (ref 15–41)
Albumin: 3 g/dL — ABNORMAL LOW (ref 3.5–5.0)
Alkaline Phosphatase: 49 U/L (ref 38–126)
Anion gap: 4 — ABNORMAL LOW (ref 5–15)
BUN: 17 mg/dL (ref 6–20)
CO2: 27 mmol/L (ref 22–32)
Calcium: 7.6 mg/dL — ABNORMAL LOW (ref 8.9–10.3)
Chloride: 105 mmol/L (ref 98–111)
Creatinine: 0.66 mg/dL (ref 0.44–1.00)
GFR, Estimated: >60 mL/min (ref >60)
Glucose, Bld: 88 mg/dL (ref 70–99)
Potassium: 3.4 mmol/L — ABNORMAL LOW (ref 3.5–5.1)
Sodium: 136 mmol/L (ref 135–145)
Total Bilirubin: 0.4 mg/dL (ref 0.3–1.2)
Total Protein: 8.3 g/dL — ABNORMAL HIGH (ref 6.5–8.1)

## 2022-11-18 LAB — CBC WITH DIFFERENTIAL (CANCER CENTER ONLY)
Abs Immature Granulocytes: 0.12 10*3/uL — ABNORMAL HIGH (ref 0.00–0.07)
Basophils Absolute: 0 10*3/uL (ref 0.0–0.1)
Basophils Relative: 0 %
Eosinophils Absolute: 0.1 10*3/uL (ref 0.0–0.5)
Eosinophils Relative: 1 %
HCT: 28.8 % — ABNORMAL LOW (ref 36.0–46.0)
Hemoglobin: 9 g/dL — ABNORMAL LOW (ref 12.0–15.0)
Immature Granulocytes: 1 %
Lymphocytes Relative: 21 %
Lymphs Abs: 2.3 10*3/uL (ref 0.7–4.0)
MCH: 30.3 pg (ref 26.0–34.0)
MCHC: 31.3 g/dL (ref 30.0–36.0)
MCV: 97 fL (ref 80.0–100.0)
Monocytes Absolute: 2 10*3/uL — ABNORMAL HIGH (ref 0.1–1.0)
Monocytes Relative: 18 %
Neutro Abs: 6.6 10*3/uL (ref 1.7–7.7)
Neutrophils Relative %: 59 %
Platelet Count: 266 10*3/uL (ref 150–400)
RBC: 2.97 MIL/uL — ABNORMAL LOW (ref 3.87–5.11)
RDW: 17.9 % — ABNORMAL HIGH (ref 11.5–15.5)
WBC Count: 11.2 10*3/uL — ABNORMAL HIGH (ref 4.0–10.5)
nRBC: 0.2 % (ref 0.0–0.2)

## 2022-11-18 MED ORDER — SODIUM CHLORIDE 0.9 % IV SOLN
16.0000 mg/kg | Freq: Once | INTRAVENOUS | Status: AC
  Administered 2022-11-18: 1600 mg via INTRAVENOUS
  Filled 2022-11-18: qty 80

## 2022-11-18 MED ORDER — DIPHENHYDRAMINE HCL 25 MG PO CAPS
50.0000 mg | ORAL_CAPSULE | Freq: Once | ORAL | Status: AC
  Administered 2022-11-18: 50 mg via ORAL
  Filled 2022-11-18: qty 2

## 2022-11-18 MED ORDER — SODIUM CHLORIDE 0.9 % IV SOLN: Freq: Once | INTRAVENOUS | Status: AC

## 2022-11-18 MED ORDER — PROCHLORPERAZINE MALEATE 10 MG PO TABS
10.0000 mg | ORAL_TABLET | Freq: Four times a day (QID) | ORAL | Status: DC | PRN
  Administered 2022-11-18: 10 mg via ORAL
  Filled 2022-11-18: qty 1

## 2022-11-18 MED ORDER — ACETAMINOPHEN 325 MG PO TABS
650.0000 mg | ORAL_TABLET | Freq: Once | ORAL | Status: AC
  Administered 2022-11-18: 650 mg via ORAL
  Filled 2022-11-18: qty 2

## 2022-11-18 MED ORDER — SODIUM CHLORIDE 0.9 % IV SOLN
20.0000 mg | Freq: Once | INTRAVENOUS | Status: AC
  Administered 2022-11-18: 20 mg via INTRAVENOUS
  Filled 2022-11-18: qty 20

## 2022-11-18 NOTE — Progress Notes (Signed)
Called [facility] per NP request regarding which type of port is needed for transplant. Per Dr. Lalla Brothers, Pt needs double lumen port. Per IR, order needs to specify double lumen and therefore must be reordered. Per MD OK, original order D/Cd and new order for port insertion placed. FYI, phone number for Dr. Lalla Brothers RN is [phone] option 1.

## 2022-11-18 NOTE — Patient Instructions (Signed)
[facility]  Discharge Instructions: Thank you for choosing [facility] to provide your oncology and hematology care.   If you have a lab appointment with the Cancer Center, please go directly to the Cancer Center and check in at the registration area.   Wear comfortable clothing and clothing appropriate for easy access to any Portacath or PICC line.   We strive to give you quality time with your provider. You may need to reschedule your appointment if you arrive late (15 or more minutes).  Arriving late affects you and other patients whose appointments are after yours.  Also, if you miss three or more appointments without notifying the office, you may be dismissed from the clinic at the provider's discretion.      For prescription refill requests, have your pharmacy contact our office and allow 72 hours for refills to be completed.    Today you received the following chemotherapy and/or immunotherapy agents: Darzalex    To help prevent nausea and vomiting after your treatment, we encourage you to take your nausea medication as directed.  BELOW ARE SYMPTOMS THAT SHOULD BE REPORTED IMMEDIATELY: *FEVER GREATER THAN 100.4 F (38 C) OR HIGHER *CHILLS OR SWEATING *NAUSEA AND VOMITING THAT IS NOT CONTROLLED WITH YOUR NAUSEA MEDICATION *UNUSUAL SHORTNESS OF BREATH *UNUSUAL BRUISING OR BLEEDING *URINARY PROBLEMS (pain or burning when urinating, or frequent urination) *BOWEL PROBLEMS (unusual diarrhea, constipation, pain near the anus) TENDERNESS IN MOUTH AND THROAT WITH OR WITHOUT PRESENCE OF ULCERS (sore throat, sores in mouth, or a toothache) UNUSUAL RASH, SWELLING OR PAIN  UNUSUAL VAGINAL DISCHARGE OR ITCHING   Items with * indicate a potential emergency and should be followed up as soon as possible or go to the Emergency Department if any problems should occur.  Please show the CHEMOTHERAPY ALERT CARD or IMMUNOTHERAPY ALERT CARD at check-in  to the Emergency Department and triage nurse.  Should you have questions after your visit or need to cancel or reschedule your appointment, please contact [facility]  Dept: [phone]  and follow the prompts.  Office hours are 8:00 a.m. to 4:30 p.m. Monday - Friday. Please note that voicemails left after 4:00 p.m. may not be returned until the following business day.  We are closed weekends and major holidays. You have access to a nurse at all times for urgent questions. Please call the main number to the clinic Dept: [phone] and follow the prompts.   For any non-urgent questions, you may also contact your provider using MyChart. We now offer e-Visits for anyone 18 and older to request care online for non-urgent symptoms. For details visit [email or web address].   Also download the MyChart app! Go to the app store, search "MyChart", open the app, select , and log in with your MyChart username and password.   

## 2022-11-18 NOTE — Assessment & Plan Note (Addendum)
Kara Medina is a 47 year old woman with multiple myeloma here today for follow-up and evaluation prior to receiving cycle 1 day 15 of daratumumab VRD therapy for her multiple myeloma.  Multiple myeloma: She is tolerating treatment well.  She is due for daratumumab alone today and will proceed with this.  She has no signs of toxicity at this point from her therapy.  Had her labs with her today.  Her protein level is decreasing nicely and her hemoglobin is increasing slowly as well. Hypocalcemia: She is going to take 2 Tums 3 times a day instead of twice daily.  We will recheck her calcium level next week. Hypokalemia: We reviewed high potassium foods such as melons bananas and potatoes.  She is going to focus on a high potassium diet.  We will recheck this next week. Fatigue: She is managing this with energy conservation.  She is going through a lot right now with moving in addition to having 2 daughters graduating.  Her tiredness in addition to her cancer and its treatment is understandable.  I recommended continued energy conservation as she is doing. Port placement: I asked my nurse Myriam Jacobson and Theora Gianotti to call [facility] to ascertain what type of port she will need, whether it is double or single-lumen.  Once we confirm this I will double check her orders and follow-up with IR to see what the status is of getting her port placement scheduled.  [name] will return to clinic weekly for labs, follow-up, and treatment.

## 2022-11-18 NOTE — Progress Notes (Signed)
[facility] Cancer Follow up:    [name], MD [address] [place] [address]   DIAGNOSIS:  Cancer Staging  No matching staging information was found for the patient.  SUMMARY OF ONCOLOGIC HISTORY: Oncology History  Multiple myeloma (HCC)  09/12/2022 Initial Diagnosis   Multiple myeloma (HCC)   09/20/2022 - 10/04/2022 Chemotherapy   Patient is on Treatment Plan : MYELOMA NEWLY DIAGNOSED TRANSPLANT CANDIDATE DaraVRd (Daratumumab IV) q21d x 6 Cycles (Induction/Consolidation)     10/11/2022 - 10/22/2022 Chemotherapy   Patient is on Treatment Plan : MYELOMA NON-TRANSPLANT CANDIDATES VRd SQ q21d     11/04/2022 -  Chemotherapy   Patient is on Treatment Plan : MYELOMA NEWLY DIAGNOSED TRANSPLANT CANDIDATE DaraVRd (Daratumumab IV) q21d x 6 Cycles (Induction/Consolidation)       CURRENT THERAPY: Daratumumab, Velcade, Revlimid, Dexamethasone  INTERVAL HISTORY: Kara Medina 47 y.o. female returns for follow-up and evaluation prior to receiving day 15 of her Dara VRD.  She is due for daratumumab alone today.  She recently completed the lenalidomide.  She is doing well today and has no issues.  She notes that she is in the process of moving.  She is moving from home that she shares with her husband and 4 children and she also has 2 twins who are graduating from high school this spring as well.  She is also wanting to go ahead and have a port placed since she is going to need 1 for her autologous bone marrow transplant coupled with the fact that she is a difficult stick and the sticks are getting challenging for her to endure weekly.   Patient Active Problem List   Diagnosis Date Noted   HTN (hypertension) 11/14/2022   Multiple myeloma (HCC) 09/12/2022   MGUS (monoclonal gammopathy of unknown significance) 09/10/2022   Hypertensive urgency 08/29/2022   Iron deficiency anemia 08/29/2022   LVH (left ventricular hypertrophy) 08/29/2022   Normocytic anemia  08/29/2022    is allergic to amoxicillin.  MEDICAL HISTORY: Past Medical History:  Diagnosis Date   Anemia    Gestational diabetes    diet controlled   Postpartum care following vaginal delivery (7/27) 02/09/2016   Pregnancy induced hypertension    Trigeminal neuralgia    2012 corrected with surgery    SURGICAL HISTORY: Past Surgical History:  Procedure Laterality Date   APPENDECTOMY  2008   TRIGEMINAL NERVE DECOMPRESSION  2012    SOCIAL HISTORY: Social History   Socioeconomic History   Marital status: Married    Spouse name: Not on file   Number of children: Not on file   Years of education: Not on file   Highest education level: Bachelor's degree (e.g., BA, AB, BS)  Occupational History   Not on file  Tobacco Use   Smoking status: Never   Smokeless tobacco: Never  Vaping Use   Vaping Use: Never used  Substance and Sexual Activity   Alcohol use: No   Drug use: No   Sexual activity: Yes  Other Topics Concern   Not on file  Social History Narrative   Not on file   Social Determinants of Health   Financial Resource Strain: Low Risk  (08/23/2022)   Overall Financial Resource Strain (CARDIA)    Difficulty of Paying Living Expenses: Not hard at all  Food Insecurity: No Food Insecurity (08/23/2022)   Hunger Vital Sign    Worried About Running Out of Food in the Last Year: Never true  Ran Out of Food in the Last Year: Never true  Transportation Needs: No Transportation Needs (08/23/2022)   PRAPARE - Administrator, Civil Service (Medical): No    Lack of Transportation (Non-Medical): No  Physical Activity: Insufficiently Active (08/23/2022)   Exercise Vital Sign    Days of Exercise per Week: 2 days    Minutes of Exercise per Session: 30 min  Stress: Stress Concern Present (08/23/2022)   [employer] of Occupational Health - Occupational Stress Questionnaire    Feeling of Stress : To some extent  Social Connections: Unknown (08/23/2022)   Social  Connection and Isolation Panel [NHANES]    Frequency of Communication with Friends and Family: More than three times a week    Frequency of Social Gatherings with Friends and Family: Once a week    Attends Religious Services: Patient declined    Database administrator or Organizations: Yes    Attends Engineer, structural: More than 4 times per year    Marital Status: Married  Catering manager Violence: Not on file    FAMILY HISTORY: Family History  Problem Relation Age of Onset   Cancer Other    Hypertension Other    Healthy Father    Cancer Paternal Grandfather        stomach, lung   Hypertension Mother    Breast cancer Mother 40   Hypertension Maternal Grandmother    Arthritis Maternal Grandmother    Hypertension Maternal Grandfather    Other Brother        ?prediabetic   Diabetes Paternal Grandmother    Hypertension Paternal Grandmother    Autism Son     Review of Systems  Constitutional:  Positive for fatigue (mild but improving as her hemoglobin improves). Negative for appetite change, chills, fever and unexpected weight change.  HENT:   Negative for hearing loss, lump/mass and trouble swallowing.   Eyes:  Negative for eye problems and icterus.  Respiratory:  Negative for chest tightness, cough and shortness of breath.   Cardiovascular:  Negative for chest pain, leg swelling and palpitations.  Gastrointestinal:  Negative for abdominal distention, abdominal pain, constipation, diarrhea, nausea and vomiting.  Endocrine: Negative for hot flashes.  Genitourinary:  Negative for difficulty urinating.   Musculoskeletal:  Negative for arthralgias.  Skin:  Negative for itching and rash.  Neurological:  Negative for dizziness, extremity weakness, headaches and numbness.  Hematological:  Negative for adenopathy. Does not bruise/bleed easily.  Psychiatric/Behavioral:  Negative for depression. The patient is not nervous/anxious.       PHYSICAL  EXAMINATION    Vitals:   11/18/22 0918  BP: 111/64  Pulse: 93  Resp: 18  Temp: 97.7 F (36.5 C)  SpO2: 100%    Physical Exam  LABORATORY DATA:  CBC    Component Value Date/Time   WBC 11.2 (H) 11/18/2022 0859   WBC 6.1 11/07/2022 0853   RBC 2.97 (L) 11/18/2022 0859   HGB 9.0 (L) 11/18/2022 0859   HCT 28.8 (L) 11/18/2022 0859   HCT 24.4 (L) 09/04/2022 1037   PLT 266 11/18/2022 0859   MCV 97.0 11/18/2022 0859   MCH 30.3 11/18/2022 0859   MCHC 31.3 11/18/2022 0859   RDW 17.9 (H) 11/18/2022 0859   LYMPHSABS 2.3 11/18/2022 0859   MONOABS 2.0 (H) 11/18/2022 0859   EOSABS 0.1 11/18/2022 0859   BASOSABS 0.0 11/18/2022 0859    CMP     Component Value Date/Time   NA 136 11/18/2022 0859  K 3.4 (L) 11/18/2022 0859   CL 105 11/18/2022 0859   CO2 27 11/18/2022 0859   GLUCOSE 88 11/18/2022 0859   BUN 17 11/18/2022 0859   CREATININE 0.66 11/18/2022 0859   CREATININE 0.95 08/23/2022 1631   CALCIUM 7.6 (L) 11/18/2022 0859   PROT 8.3 (H) 11/18/2022 0859   ALBUMIN 3.0 (L) 11/18/2022 0859   AST 12 (L) 11/18/2022 0859   ALT 18 11/18/2022 0859   ALKPHOS 49 11/18/2022 0859   BILITOT 0.4 11/18/2022 0859   GFRNONAA >60 11/18/2022 0859   GFRAA >60 02/09/2016 0525       ASSESSMENT and THERAPY PLAN:   Multiple myeloma (HCC) Docie is a 47 year old woman with multiple myeloma here today for follow-up and evaluation prior to receiving cycle 1 day 15 of daratumumab VRD therapy for her multiple myeloma.  Multiple myeloma: She is tolerating treatment well.  She is due for daratumumab alone today and will proceed with this.  She has no signs of toxicity at this point from her therapy.  Had her labs with her today.  Her protein level is decreasing nicely and her hemoglobin is increasing slowly as well. Hypocalcemia: She is going to take 2 Tums 3 times a day instead of twice daily.  We will recheck her calcium level next week. Hypokalemia: We reviewed high potassium foods such as  melons bananas and potatoes.  She is going to focus on a high potassium diet.  We will recheck this next week. Fatigue: She is managing this with energy conservation.  She is going through a lot right now with moving in addition to having 2 daughters graduating.  Her tiredness in addition to her cancer and its treatment is understandable.  I recommended continued energy conservation as she is doing. Port placement: I asked my nurse Myriam Jacobson and Theora Gianotti to call [facility] to ascertain what type of port she will need, whether it is double or single-lumen.  Once we confirm this I will double check her orders and follow-up with IR to see what the status is of getting her port placement scheduled.  Andersen will return to clinic weekly for labs, follow-up, and treatment.    All questions were answered. The patient knows to call the clinic with any problems, questions or concerns. We can certainly see the patient much sooner if necessary.  Total encounter time:  20 minutes*in face-to-face visit time, chart review, lab review, care coordination, order entry, and documentation of the encounter time.    Lillard Anes, NP 11/18/22 9:50 AM Medical Oncology and Hematology [facility] [address] [place], [place] [address] Tel. [phone]    Fax. [phone]  *Total Encounter Time as defined by the Centers for Medicare and Medicaid Services includes, in addition to the face-to-face time of a patient visit (documented in the note above) non-face-to-face time: obtaining and reviewing outside history, ordering and reviewing medications, tests or procedures, care coordination (communications with other health care professionals or caregivers) and documentation in the medical record.

## 2022-11-19 ENCOUNTER — Other Ambulatory Visit: Payer: Self-pay

## 2022-11-19 ENCOUNTER — Other Ambulatory Visit: Payer: Self-pay | Admitting: *Deleted

## 2022-11-19 DIAGNOSIS — C9000 Multiple myeloma not having achieved remission: Secondary | ICD-10-CM

## 2022-11-20 ENCOUNTER — Encounter: Payer: Self-pay | Admitting: Hematology and Oncology

## 2022-11-20 LAB — IGG, IGA, IGM
IgA: 79 mg/dL — ABNORMAL LOW (ref 87–352)
IgG (Immunoglobin G), Serum: 4174 mg/dL — ABNORMAL HIGH (ref 586–1602)
IgM (Immunoglobulin M), Srm: 35 mg/dL (ref 26–217)

## 2022-11-21 ENCOUNTER — Other Ambulatory Visit: Payer: Self-pay | Admitting: Internal Medicine

## 2022-11-21 ENCOUNTER — Other Ambulatory Visit: Payer: Self-pay | Admitting: Radiology

## 2022-11-21 DIAGNOSIS — C9000 Multiple myeloma not having achieved remission: Secondary | ICD-10-CM

## 2022-11-22 ENCOUNTER — Ambulatory Visit (HOSPITAL_COMMUNITY): Payer: 59

## 2022-11-22 ENCOUNTER — Other Ambulatory Visit (HOSPITAL_COMMUNITY): Payer: 59

## 2022-11-22 MED FILL — Dexamethasone Sodium Phosphate Inj 100 MG/10ML: INTRAMUSCULAR | Qty: 2 | Status: AC

## 2022-11-24 ENCOUNTER — Other Ambulatory Visit: Payer: Self-pay

## 2022-11-25 ENCOUNTER — Other Ambulatory Visit: Payer: Self-pay | Admitting: *Deleted

## 2022-11-25 ENCOUNTER — Inpatient Hospital Stay (HOSPITAL_BASED_OUTPATIENT_CLINIC_OR_DEPARTMENT_OTHER): Payer: 59 | Admitting: Hematology and Oncology

## 2022-11-25 ENCOUNTER — Inpatient Hospital Stay: Payer: 59

## 2022-11-25 ENCOUNTER — Other Ambulatory Visit: Payer: Self-pay

## 2022-11-25 ENCOUNTER — Other Ambulatory Visit: Payer: 59

## 2022-11-25 VITALS — BP 126/76 | HR 97 | Temp 97.5°F | Resp 14 | Wt 227.8 lb

## 2022-11-25 DIAGNOSIS — D472 Monoclonal gammopathy: Secondary | ICD-10-CM | POA: Diagnosis not present

## 2022-11-25 DIAGNOSIS — C9000 Multiple myeloma not having achieved remission: Secondary | ICD-10-CM

## 2022-11-25 DIAGNOSIS — C9002 Multiple myeloma in relapse: Secondary | ICD-10-CM

## 2022-11-25 DIAGNOSIS — Z5112 Encounter for antineoplastic immunotherapy: Secondary | ICD-10-CM | POA: Diagnosis not present

## 2022-11-25 LAB — CMP (CANCER CENTER ONLY)
ALT: 14 U/L (ref 0–44)
AST: 14 U/L — ABNORMAL LOW (ref 15–41)
Albumin: 3 g/dL — ABNORMAL LOW (ref 3.5–5.0)
Alkaline Phosphatase: 39 U/L (ref 38–126)
Anion gap: 7 (ref 5–15)
BUN: 18 mg/dL (ref 6–20)
CO2: 25 mmol/L (ref 22–32)
Calcium: 7.5 mg/dL — ABNORMAL LOW (ref 8.9–10.3)
Chloride: 106 mmol/L (ref 98–111)
Creatinine: 0.67 mg/dL (ref 0.44–1.00)
GFR, Estimated: >60 mL/min (ref >60)
Glucose, Bld: 85 mg/dL (ref 70–99)
Potassium: 3.3 mmol/L — ABNORMAL LOW (ref 3.5–5.1)
Sodium: 138 mmol/L (ref 135–145)
Total Bilirubin: 0.4 mg/dL (ref 0.3–1.2)
Total Protein: 8.1 g/dL (ref 6.5–8.1)

## 2022-11-25 LAB — CBC WITH DIFFERENTIAL (CANCER CENTER ONLY)
Abs Immature Granulocytes: 0.02 10*3/uL (ref 0.00–0.07)
Basophils Absolute: 0 10*3/uL (ref 0.0–0.1)
Basophils Relative: 0 %
Eosinophils Absolute: 0 10*3/uL (ref 0.0–0.5)
Eosinophils Relative: 0 %
HCT: 26.2 % — ABNORMAL LOW (ref 36.0–46.0)
Hemoglobin: 8.2 g/dL — ABNORMAL LOW (ref 12.0–15.0)
Immature Granulocytes: 0 %
Lymphocytes Relative: 20 %
Lymphs Abs: 1.3 10*3/uL (ref 0.7–4.0)
MCH: 29.8 pg (ref 26.0–34.0)
MCHC: 31.3 g/dL (ref 30.0–36.0)
MCV: 95.3 fL (ref 80.0–100.0)
Monocytes Absolute: 0.4 10*3/uL (ref 0.1–1.0)
Monocytes Relative: 6 %
Neutro Abs: 4.6 10*3/uL (ref 1.7–7.7)
Neutrophils Relative %: 74 %
Platelet Count: 393 10*3/uL (ref 150–400)
RBC: 2.75 MIL/uL — ABNORMAL LOW (ref 3.87–5.11)
RDW: 17.3 % — ABNORMAL HIGH (ref 11.5–15.5)
WBC Count: 6.2 10*3/uL (ref 4.0–10.5)
nRBC: 0 % (ref 0.0–0.2)

## 2022-11-25 LAB — PREGNANCY, URINE: Preg Test, Ur: NEGATIVE

## 2022-11-25 MED ORDER — LENALIDOMIDE 15 MG PO CAPS
15.0000 mg | ORAL_CAPSULE | Freq: Every day | ORAL | 0 refills | Status: DC
Start: 2022-11-25 — End: 2022-12-16

## 2022-11-25 NOTE — Progress Notes (Signed)
[facility] Cancer Follow up:    Kara Saint, MD [address] [place] [address]   ASSESSMENT & PLAN:    This is a very pleasant 47 year old female patient with a past medical history significant for gestational diabetes and trigeminal neuralgia corrected with surgery, hypertension referred to hematology for evaluation and recommendations regarding severe normocytic normochromic anemia.   We have reviewed her labs which showed severe anemia and elevated total protein.  SPEP showed 8.4 g/dL, IgG kappa.  Kappa lambda ratio at 110.89 No evidence of endorgan damage so far.   BMB confirmed Ig G kappa MM, bone survey pending scheduled for Wednesday. FISH for multiple myeloma showed a gain of chromosome 11, characterized as standard risk.  Cytogenetics showed normal female karyotype RISS-II, median survival 42 months.  We started her on daratumumab with Velcade and dexamethasone prior to her FISH results being available. She started cycle 1 day 1 of Darzalex/Velcade with dexamethasone on 09/20/2022.    From second cycle, we transitioned to VRD since she is a standard risk. She then was seen in [facility], they recommended that we transition back to dara VRD.   Hence we restarted Dara VRD. She is doing really well until last week. She had cough, SOB went to the ED with flu like symptoms,tested pos for flu A. She completed her tamiflu course. She still feels SOB, she can't take deep breaths. No fevers or chills.  On physical exam today, she was unable to take deep breaths which is a new complaint for her.  Vital signs reassuring.  She is however still recovering from the recent flu appears like.  We have agreed to defer treatment by a week.  I have gone over the treatment cycle in detail today, all the treatment appointments appear accurate.   I have discussed this in detail with the patient.  She is on board with all the recommendations.  She understands that auto transplant is  standard of care for multiple myeloma after induction chemotherapy RTC as scheduled.  DIAGNOSIS:  Cancer Staging  No matching staging information was found for the patient.  SUMMARY OF ONCOLOGIC HISTORY: Oncology History  Multiple myeloma (HCC)  09/12/2022 Initial Diagnosis   Multiple myeloma (HCC)   09/20/2022 - 10/04/2022 Chemotherapy   Patient is on Treatment Plan : MYELOMA NEWLY DIAGNOSED TRANSPLANT CANDIDATE DaraVRd (Daratumumab IV) q21d x 6 Cycles (Induction/Consolidation)     10/11/2022 - 10/22/2022 Chemotherapy   Patient is on Treatment Plan : MYELOMA NON-TRANSPLANT CANDIDATES VRd SQ q21d     11/04/2022 -  Chemotherapy   Patient is on Treatment Plan : MYELOMA NEWLY DIAGNOSED TRANSPLANT CANDIDATE DaraVRd (Daratumumab IV) q21d x 6 Cycles (Induction/Consolidation)       CURRENT THERAPY: Daratumumab, Velcade, Revlimid, Dexamethasone  INTERVAL HISTORY:  Kara Medina 47 y.o. female returns for follow-up and evaluation prior to Dara VRD.   She went to urgent with cold and cough and was diagnosed with Influenza A. No one else in the family with same symptoms. She still continues to have some rhinorrhea, sounds stuffy and some SOB. She is still not able to take deep breaths. No fevers or chills. No bone pains or neuropathy. Rest of the pertinent 10 point ROS reviewed and neg  Patient Active Problem List   Diagnosis Date Noted   HTN (hypertension) 11/14/2022   Multiple myeloma (HCC) 09/12/2022   MGUS (monoclonal gammopathy of unknown significance) 09/10/2022   Hypertensive urgency 08/29/2022   Iron deficiency anemia 08/29/2022  LVH (left ventricular hypertrophy) 08/29/2022   Normocytic anemia 08/29/2022    is allergic to amoxicillin.  MEDICAL HISTORY: Past Medical History:  Diagnosis Date   Anemia    Gestational diabetes    diet controlled   Postpartum care following vaginal delivery (7/27) 02/09/2016   Pregnancy induced hypertension    Trigeminal neuralgia    2012  corrected with surgery    SURGICAL HISTORY: Past Surgical History:  Procedure Laterality Date   APPENDECTOMY  2008   TRIGEMINAL NERVE DECOMPRESSION  2012    SOCIAL HISTORY: Social History   Socioeconomic History   Marital status: Married    Spouse name: Not on file   Number of children: Not on file   Years of education: Not on file   Highest education level: Bachelor's degree (e.g., BA, AB, BS)  Occupational History   Not on file  Tobacco Use   Smoking status: Never   Smokeless tobacco: Never  Vaping Use   Vaping Use: Never used  Substance and Sexual Activity   Alcohol use: No   Drug use: No   Sexual activity: Yes  Other Topics Concern   Not on file  Social History Narrative   Not on file   Social Determinants of Health   Financial Resource Strain: Low Risk  (08/23/2022)   Overall Financial Resource Strain (CARDIA)    Difficulty of Paying Living Expenses: Not hard at all  Food Insecurity: No Food Insecurity (08/23/2022)   Hunger Vital Sign    Worried About Running Out of Food in the Last Year: Never true    Ran Out of Food in the Last Year: Never true  Transportation Needs: No Transportation Needs (08/23/2022)   PRAPARE - Administrator, Civil Service (Medical): No    Lack of Transportation (Non-Medical): No  Physical Activity: Insufficiently Active (08/23/2022)   Exercise Vital Sign    Days of Exercise per Week: 2 days    Minutes of Exercise per Session: 30 min  Stress: Stress Concern Present (08/23/2022)   [employer] of Occupational Health - Occupational Stress Questionnaire    Feeling of Stress : To some extent  Social Connections: Unknown (08/23/2022)   Social Connection and Isolation Panel [NHANES]    Frequency of Communication with Friends and Family: More than three times a week    Frequency of Social Gatherings with Friends and Family: Once a week    Attends Religious Services: Patient declined    Database administrator or Organizations: Yes     Attends Engineer, structural: More than 4 times per year    Marital Status: Married  Catering manager Violence: Not on file    FAMILY HISTORY: Family History  Problem Relation Age of Onset   Cancer Other    Hypertension Other    Healthy Father    Cancer Paternal Grandfather        stomach, lung   Hypertension Mother    Breast cancer Mother 51   Hypertension Maternal Grandmother    Arthritis Maternal Grandmother    Hypertension Maternal Grandfather    Other Brother        ?prediabetic   Diabetes Paternal Grandmother    Hypertension Paternal Grandmother    Autism Son     Review of Systems  Constitutional:  Positive for fatigue (mild but improving as her hemoglobin improves). Negative for appetite change, chills, fever and unexpected weight change.  HENT:   Negative for hearing loss, lump/mass and trouble swallowing.  Eyes:  Negative for eye problems and icterus.  Respiratory:  Positive for shortness of breath. Negative for chest tightness and cough.   Cardiovascular:  Negative for chest pain, leg swelling and palpitations.  Gastrointestinal:  Negative for abdominal distention, abdominal pain, constipation, diarrhea, nausea and vomiting.  Endocrine: Negative for hot flashes.  Genitourinary:  Negative for difficulty urinating.   Musculoskeletal:  Negative for arthralgias.  Skin:  Negative for itching and rash.  Neurological:  Negative for dizziness, extremity weakness, headaches and numbness.  Hematological:  Negative for adenopathy. Does not bruise/bleed easily.  Psychiatric/Behavioral:  Negative for depression. The patient is not nervous/anxious.       PHYSICAL EXAMINATION    Vitals:   11/25/22 0929  BP: 126/76  Pulse: 97  Resp: 14  Temp: (!) 97.5 F (36.4 C)  SpO2: 100%    Physical Exam Constitutional:      Appearance: Normal appearance.  Pulmonary:     Comments: Diminished effort, can't take deep breaths. No overt adv sounds. Abdominal:      General: Abdomen is flat.     Palpations: Abdomen is soft.  Neurological:     General: No focal deficit present.     Mental Status: She is alert.     LABORATORY DATA:  CBC    Component Value Date/Time   WBC 6.2 11/25/2022 0851   WBC 6.1 11/07/2022 0853   RBC 2.75 (L) 11/25/2022 0851   HGB 8.2 (L) 11/25/2022 0851   HCT 26.2 (L) 11/25/2022 0851   HCT 24.4 (L) 09/04/2022 1037   PLT 393 11/25/2022 0851   MCV 95.3 11/25/2022 0851   MCH 29.8 11/25/2022 0851   MCHC 31.3 11/25/2022 0851   RDW 17.3 (H) 11/25/2022 0851   LYMPHSABS 1.3 11/25/2022 0851   MONOABS 0.4 11/25/2022 0851   EOSABS 0.0 11/25/2022 0851   BASOSABS 0.0 11/25/2022 0851    CMP     Component Value Date/Time   NA 136 11/18/2022 0859   K 3.4 (L) 11/18/2022 0859   CL 105 11/18/2022 0859   CO2 27 11/18/2022 0859   GLUCOSE 88 11/18/2022 0859   BUN 17 11/18/2022 0859   CREATININE 0.66 11/18/2022 0859   CREATININE 0.95 08/23/2022 1631   CALCIUM 7.6 (L) 11/18/2022 0859   PROT 8.3 (H) 11/18/2022 0859   ALBUMIN 3.0 (L) 11/18/2022 0859   AST 12 (L) 11/18/2022 0859   ALT 18 11/18/2022 0859   ALKPHOS 49 11/18/2022 0859   BILITOT 0.4 11/18/2022 0859   GFRNONAA >60 11/18/2022 0859   GFRAA >60 02/09/2016 0525    Total encounter time: 30 min  *Total Encounter Time as defined by the Centers for Medicare and Medicaid Services includes, in addition to the face-to-face time of a patient visit (documented in the note above) non-face-to-face time: obtaining and reviewing outside history, ordering and reviewing medications, tests or procedures, care coordination (communications with other health care professionals or caregivers) and documentation in the medical record.

## 2022-11-26 ENCOUNTER — Other Ambulatory Visit: Payer: Self-pay

## 2022-11-26 ENCOUNTER — Other Ambulatory Visit: Payer: Self-pay | Admitting: Radiology

## 2022-11-26 ENCOUNTER — Telehealth: Payer: Self-pay | Admitting: Hematology and Oncology

## 2022-11-26 LAB — KAPPA/LAMBDA LIGHT CHAINS
Kappa free light chain: 234.9 mg/L — ABNORMAL HIGH (ref 3.3–19.4)
Kappa, lambda light chain ratio: 17.53 — ABNORMAL HIGH (ref 0.26–1.65)
Lambda free light chains: 13.4 mg/L (ref 5.7–26.3)

## 2022-11-26 NOTE — Telephone Encounter (Signed)
Spoke with patient confirming upcoming appointment change  

## 2022-11-27 ENCOUNTER — Inpatient Hospital Stay (HOSPITAL_COMMUNITY): Admission: RE | Admit: 2022-11-27 | Payer: 59 | Source: Ambulatory Visit

## 2022-11-27 ENCOUNTER — Encounter (HOSPITAL_COMMUNITY): Payer: Self-pay

## 2022-11-27 ENCOUNTER — Ambulatory Visit (HOSPITAL_COMMUNITY): Payer: 59

## 2022-11-28 ENCOUNTER — Ambulatory Visit: Payer: 59 | Admitting: Nurse Practitioner

## 2022-11-28 ENCOUNTER — Ambulatory Visit: Payer: 59

## 2022-11-28 ENCOUNTER — Other Ambulatory Visit: Payer: Self-pay

## 2022-11-29 ENCOUNTER — Encounter: Payer: Self-pay | Admitting: Hematology and Oncology

## 2022-11-29 MED FILL — Dexamethasone Sodium Phosphate Inj 100 MG/10ML: INTRAMUSCULAR | Qty: 2 | Status: AC

## 2022-12-02 ENCOUNTER — Inpatient Hospital Stay: Payer: 59

## 2022-12-02 ENCOUNTER — Other Ambulatory Visit: Payer: 59

## 2022-12-02 ENCOUNTER — Ambulatory Visit: Payer: 59

## 2022-12-02 ENCOUNTER — Ambulatory Visit: Payer: 59 | Admitting: Adult Health

## 2022-12-02 ENCOUNTER — Other Ambulatory Visit: Payer: Self-pay | Admitting: Student

## 2022-12-02 ENCOUNTER — Inpatient Hospital Stay (HOSPITAL_BASED_OUTPATIENT_CLINIC_OR_DEPARTMENT_OTHER): Payer: 59 | Admitting: Hematology and Oncology

## 2022-12-02 VITALS — BP 133/91 | HR 85 | Temp 97.5°F | Resp 16 | Wt 229.3 lb

## 2022-12-02 DIAGNOSIS — D649 Anemia, unspecified: Secondary | ICD-10-CM

## 2022-12-02 DIAGNOSIS — D472 Monoclonal gammopathy: Secondary | ICD-10-CM

## 2022-12-02 DIAGNOSIS — C9000 Multiple myeloma not having achieved remission: Secondary | ICD-10-CM

## 2022-12-02 DIAGNOSIS — Z5112 Encounter for antineoplastic immunotherapy: Secondary | ICD-10-CM | POA: Diagnosis not present

## 2022-12-02 DIAGNOSIS — G250 Essential tremor: Secondary | ICD-10-CM

## 2022-12-02 LAB — COMPREHENSIVE METABOLIC PANEL
ALT: 19 U/L (ref 0–44)
AST: 18 U/L (ref 15–41)
Albumin: 3.2 g/dL — ABNORMAL LOW (ref 3.5–5.0)
Alkaline Phosphatase: 47 U/L (ref 38–126)
Anion gap: 3 — ABNORMAL LOW (ref 5–15)
BUN: 16 mg/dL (ref 6–20)
CO2: 27 mmol/L (ref 22–32)
Calcium: 7.5 mg/dL — ABNORMAL LOW (ref 8.9–10.3)
Chloride: 107 mmol/L (ref 98–111)
Creatinine, Ser: 0.62 mg/dL (ref 0.44–1.00)
GFR, Estimated: >60 mL/min (ref >60)
Glucose, Bld: 84 mg/dL (ref 70–99)
Potassium: 3.7 mmol/L (ref 3.5–5.1)
Sodium: 137 mmol/L (ref 135–145)
Total Bilirubin: 0.4 mg/dL (ref 0.3–1.2)
Total Protein: 7.9 g/dL (ref 6.5–8.1)

## 2022-12-02 LAB — CBC WITH DIFFERENTIAL/PLATELET
Abs Immature Granulocytes: 0.09 10*3/uL — ABNORMAL HIGH (ref 0.00–0.07)
Basophils Absolute: 0.1 10*3/uL (ref 0.0–0.1)
Basophils Relative: 1 %
Eosinophils Absolute: 0.1 10*3/uL (ref 0.0–0.5)
Eosinophils Relative: 1 %
HCT: 28.9 % — ABNORMAL LOW (ref 36.0–46.0)
Hemoglobin: 8.9 g/dL — ABNORMAL LOW (ref 12.0–15.0)
Immature Granulocytes: 1 %
Lymphocytes Relative: 28 %
Lymphs Abs: 2.9 10*3/uL (ref 0.7–4.0)
MCH: 29.6 pg (ref 26.0–34.0)
MCHC: 30.8 g/dL (ref 30.0–36.0)
MCV: 96 fL (ref 80.0–100.0)
Monocytes Absolute: 2.1 10*3/uL — ABNORMAL HIGH (ref 0.1–1.0)
Monocytes Relative: 21 %
Neutro Abs: 4.9 10*3/uL (ref 1.7–7.7)
Neutrophils Relative %: 48 %
Platelets: 369 10*3/uL (ref 150–400)
RBC: 3.01 MIL/uL — ABNORMAL LOW (ref 3.87–5.11)
RDW: 17.3 % — ABNORMAL HIGH (ref 11.5–15.5)
WBC: 10.1 10*3/uL (ref 4.0–10.5)
nRBC: 0 % (ref 0.0–0.2)

## 2022-12-02 MED ORDER — SODIUM CHLORIDE 0.9 % IV SOLN
20.0000 mg | Freq: Once | INTRAVENOUS | Status: AC
  Administered 2022-12-02: 20 mg via INTRAVENOUS
  Filled 2022-12-02: qty 20
  Filled 2022-12-02: qty 2

## 2022-12-02 MED ORDER — DIPHENHYDRAMINE HCL 25 MG PO CAPS
50.0000 mg | ORAL_CAPSULE | Freq: Once | ORAL | Status: AC
  Administered 2022-12-02: 50 mg via ORAL
  Filled 2022-12-02: qty 2

## 2022-12-02 MED ORDER — PROCHLORPERAZINE MALEATE 10 MG PO TABS
10.0000 mg | ORAL_TABLET | Freq: Four times a day (QID) | ORAL | Status: DC | PRN
  Administered 2022-12-02: 10 mg via ORAL
  Filled 2022-12-02: qty 1

## 2022-12-02 MED ORDER — BORTEZOMIB CHEMO SQ INJECTION 3.5 MG (2.5MG/ML)
1.3000 mg/m2 | Freq: Once | INTRAMUSCULAR | Status: AC
  Administered 2022-12-02: 2.75 mg via SUBCUTANEOUS
  Filled 2022-12-02: qty 1.1

## 2022-12-02 MED ORDER — SODIUM CHLORIDE 0.9 % IV SOLN
16.0000 mg/kg | Freq: Once | INTRAVENOUS | Status: AC
  Administered 2022-12-02: 1600 mg via INTRAVENOUS
  Filled 2022-12-02: qty 80

## 2022-12-02 MED ORDER — SODIUM CHLORIDE 0.9 % IV SOLN: Freq: Once | INTRAVENOUS | Status: AC

## 2022-12-02 MED ORDER — ACETAMINOPHEN 325 MG PO TABS
650.0000 mg | ORAL_TABLET | Freq: Once | ORAL | Status: AC
  Administered 2022-12-02: 650 mg via ORAL
  Filled 2022-12-02: qty 2

## 2022-12-02 NOTE — Patient Instructions (Signed)
[facility]  Discharge Instructions: Thank you for choosing [facility] to provide your oncology and hematology care.   If you have a lab appointment with the Cancer Center, please go directly to the Cancer Center and check in at the registration area.   Wear comfortable clothing and clothing appropriate for easy access to any Portacath or PICC line.   We strive to give you quality time with your provider. You may need to reschedule your appointment if you arrive late (15 or more minutes).  Arriving late affects you and other patients whose appointments are after yours.  Also, if you miss three or more appointments without notifying the office, you may be dismissed from the clinic at the provider's discretion.      For prescription refill requests, have your pharmacy contact our office and allow 72 hours for refills to be completed.    Today you received the following chemotherapy and/or immunotherapy agents: Daratumumab.       To help prevent nausea and vomiting after your treatment, we encourage you to take your nausea medication as directed.  BELOW ARE SYMPTOMS THAT SHOULD BE REPORTED IMMEDIATELY: *FEVER GREATER THAN 100.4 F (38 C) OR HIGHER *CHILLS OR SWEATING *NAUSEA AND VOMITING THAT IS NOT CONTROLLED WITH YOUR NAUSEA MEDICATION *UNUSUAL SHORTNESS OF BREATH *UNUSUAL BRUISING OR BLEEDING *URINARY PROBLEMS (pain or burning when urinating, or frequent urination) *BOWEL PROBLEMS (unusual diarrhea, constipation, pain near the anus) TENDERNESS IN MOUTH AND THROAT WITH OR WITHOUT PRESENCE OF ULCERS (sore throat, sores in mouth, or a toothache) UNUSUAL RASH, SWELLING OR PAIN  UNUSUAL VAGINAL DISCHARGE OR ITCHING   Items with * indicate a potential emergency and should be followed up as soon as possible or go to the Emergency Department if any problems should occur.  Please show the CHEMOTHERAPY ALERT CARD or IMMUNOTHERAPY ALERT CARD at  check-in to the Emergency Department and triage nurse.  Should you have questions after your visit or need to cancel or reschedule your appointment, please contact [facility]  Dept: [phone]  and follow the prompts.  Office hours are 8:00 a.m. to 4:30 p.m. Monday - Friday. Please note that voicemails left after 4:00 p.m. may not be returned until the following business day.  We are closed weekends and major holidays. You have access to a nurse at all times for urgent questions. Please call the main number to the clinic Dept: [phone] and follow the prompts.   For any non-urgent questions, you may also contact your provider using MyChart. We now offer e-Visits for anyone 18 and older to request care online for non-urgent symptoms. For details visit [email or web address].   Also download the MyChart app! Go to the app store, search "MyChart", open the app, select [facility], and log in with your MyChart username and password.   

## 2022-12-02 NOTE — Progress Notes (Signed)
[facility] Cancer Follow up:    Kara Saint, MD [address] [place] [address]   ASSESSMENT & PLAN:    This is a very pleasant 47 year old female patient with a past medical history significant for gestational diabetes and trigeminal neuralgia corrected with surgery, hypertension referred to hematology for evaluation and recommendations regarding severe normocytic normochromic anemia.   We have reviewed her labs which showed severe anemia and elevated total protein.  SPEP showed 8.4 g/dL, IgG kappa.  Kappa lambda ratio at 110.89 No evidence of endorgan damage so far.   BMB confirmed Ig G kappa MM, bone survey pending scheduled for Wednesday. FISH for multiple myeloma showed a gain of chromosome 11, characterized as standard risk.  Cytogenetics showed normal female karyotype RISS-II, median survival 42 months.  We started her on daratumumab with Velcade and dexamethasone prior to her FISH results being available. She started cycle 1 day 1 of Darzalex/Velcade with dexamethasone on 09/20/2022.    From second cycle, we transitioned to VRD since she is a standard risk. She then was seen in [facility], they recommended that we transition back to dara VRD.   Hence we restarted Dara VRD. She is now doing remarkably well compared to last week.  Shortness of breath has significantly improved.  Will restart treatment today.  No concerns on physical exam.  Overall her myeloma labs look tremendously better with significant improvement of total protein, kappa lambda ratio, some of the labs from last week are still pending.  With regards to the cough, she can use Robitussin DM as needed, I expect this to continue improving in the next couple weeks.  She had a few other questions regarding her appointments.  With regards to the tremor, I have recommended that she go see a neurologist since dexamethasone is an integral part of the treatment for myeloma and we may not be able to discontinue  it.  She is in agreement and a referral has been placed.  She understands that auto transplant is standard of care for multiple myeloma after induction chemotherapy RTC as scheduled.  DIAGNOSIS:  Cancer Staging  No matching staging information was found for the patient.  SUMMARY OF ONCOLOGIC HISTORY: Oncology History  Multiple myeloma (HCC)  09/12/2022 Initial Diagnosis   Multiple myeloma (HCC)   09/20/2022 - 10/04/2022 Chemotherapy   Patient is on Treatment Plan : MYELOMA NEWLY DIAGNOSED TRANSPLANT CANDIDATE DaraVRd (Daratumumab IV) q21d x 6 Cycles (Induction/Consolidation)     10/11/2022 - 10/22/2022 Chemotherapy   Patient is on Treatment Plan : MYELOMA NON-TRANSPLANT CANDIDATES VRd SQ q21d     11/04/2022 -  Chemotherapy   Patient is on Treatment Plan : MYELOMA NEWLY DIAGNOSED TRANSPLANT CANDIDATE DaraVRd (Daratumumab IV) q21d x 6 Cycles (Induction/Consolidation)       CURRENT THERAPY: Daratumumab, Velcade, Revlimid, Dexamethasone  INTERVAL HISTORY:  Kara Medina 47 y.o. female returns for follow-up and evaluation prior to Dara VRD.   Since last visit, she feels much better. She still has some cough, with scant sputum, some rhinorrhea. SOB is better. No fevers or chills. No change in bowel habits. No change in urinary habits.  Rest of the pertinent 10 point ROS reviewed and neg  Patient Active Problem List   Diagnosis Date Noted   HTN (hypertension) 11/14/2022   Multiple myeloma (HCC) 09/12/2022   MGUS (monoclonal gammopathy of unknown significance) 09/10/2022   Hypertensive urgency 08/29/2022   Iron deficiency anemia 08/29/2022   LVH (left ventricular hypertrophy) 08/29/2022  Normocytic anemia 08/29/2022    is allergic to amoxicillin.  MEDICAL HISTORY: Past Medical History:  Diagnosis Date   Anemia    Gestational diabetes    diet controlled   Postpartum care following vaginal delivery (7/27) 02/09/2016   Pregnancy induced hypertension    Trigeminal neuralgia     2012 corrected with surgery    SURGICAL HISTORY: Past Surgical History:  Procedure Laterality Date   APPENDECTOMY  2008   TRIGEMINAL NERVE DECOMPRESSION  2012    SOCIAL HISTORY: Social History   Socioeconomic History   Marital status: Married    Spouse name: Not on file   Number of children: Not on file   Years of education: Not on file   Highest education level: Bachelor's degree (e.g., BA, AB, BS)  Occupational History   Not on file  Tobacco Use   Smoking status: Never   Smokeless tobacco: Never  Vaping Use   Vaping Use: Never used  Substance and Sexual Activity   Alcohol use: No   Drug use: No   Sexual activity: Yes  Other Topics Concern   Not on file  Social History Narrative   Not on file   Social Determinants of Health   Financial Resource Strain: Low Risk  (08/23/2022)   Overall Financial Resource Strain (CARDIA)    Difficulty of Paying Living Expenses: Not hard at all  Food Insecurity: No Food Insecurity (08/23/2022)   Hunger Vital Sign    Worried About Running Out of Food in the Last Year: Never true    Ran Out of Food in the Last Year: Never true  Transportation Needs: No Transportation Needs (08/23/2022)   PRAPARE - Administrator, Civil Service (Medical): No    Lack of Transportation (Non-Medical): No  Physical Activity: Insufficiently Active (08/23/2022)   Exercise Vital Sign    Days of Exercise per Week: 2 days    Minutes of Exercise per Session: 30 min  Stress: Stress Concern Present (08/23/2022)   [employer] of Occupational Health - Occupational Stress Questionnaire    Feeling of Stress : To some extent  Social Connections: Unknown (08/23/2022)   Social Connection and Isolation Panel [NHANES]    Frequency of Communication with Friends and Family: More than three times a week    Frequency of Social Gatherings with Friends and Family: Once a week    Attends Religious Services: Patient declined    Database administrator or Organizations:  Yes    Attends Engineer, structural: More than 4 times per year    Marital Status: Married  Catering manager Violence: Not on file    FAMILY HISTORY: Family History  Problem Relation Age of Onset   Cancer Other    Hypertension Other    Healthy Father    Cancer Paternal Grandfather        stomach, lung   Hypertension Mother    Breast cancer Mother 15   Hypertension Maternal Grandmother    Arthritis Maternal Grandmother    Hypertension Maternal Grandfather    Other Brother        ?prediabetic   Diabetes Paternal Grandmother    Hypertension Paternal Grandmother    Autism Son     Review of Systems  Constitutional:  Positive for fatigue (mild but improving as her hemoglobin improves). Negative for appetite change, chills, fever and unexpected weight change.  HENT:   Negative for hearing loss, lump/mass and trouble swallowing.   Eyes:  Negative for eye problems  and icterus.  Respiratory:  Positive for shortness of breath. Negative for chest tightness and cough.   Cardiovascular:  Negative for chest pain, leg swelling and palpitations.  Gastrointestinal:  Negative for abdominal distention, abdominal pain, constipation, diarrhea, nausea and vomiting.  Endocrine: Negative for hot flashes.  Genitourinary:  Negative for difficulty urinating.   Musculoskeletal:  Negative for arthralgias.  Skin:  Negative for itching and rash.  Neurological:  Negative for dizziness, extremity weakness, headaches and numbness.  Hematological:  Negative for adenopathy. Does not bruise/bleed easily.  Psychiatric/Behavioral:  Negative for depression. The patient is not nervous/anxious.       PHYSICAL EXAMINATION    Vitals:   12/02/22 0824  BP: (!) 133/91  Pulse: 85  Resp: 16  Temp: (!) 97.5 F (36.4 C)  SpO2: 98%    Physical Exam Constitutional:      Appearance: Normal appearance.  Pulmonary:     Effort: Pulmonary effort is normal.     Breath sounds: Normal breath sounds. No  wheezing or rales.  Abdominal:     General: Abdomen is flat.     Palpations: Abdomen is soft.  Neurological:     General: No focal deficit present.     Mental Status: She is alert.     LABORATORY DATA:  CBC    Component Value Date/Time   WBC 10.1 12/02/2022 0758   RBC 3.01 (L) 12/02/2022 0758   HGB 8.9 (L) 12/02/2022 0758   HGB 8.2 (L) 11/25/2022 0851   HCT 28.9 (L) 12/02/2022 0758   HCT 24.4 (L) 09/04/2022 1037   PLT 369 12/02/2022 0758   PLT 393 11/25/2022 0851   MCV 96.0 12/02/2022 0758   MCH 29.6 12/02/2022 0758   MCHC 30.8 12/02/2022 0758   RDW 17.3 (H) 12/02/2022 0758   LYMPHSABS 2.9 12/02/2022 0758   MONOABS 2.1 (H) 12/02/2022 0758   EOSABS 0.1 12/02/2022 0758   BASOSABS 0.1 12/02/2022 0758    CMP     Component Value Date/Time   NA 138 11/25/2022 0851   K 3.3 (L) 11/25/2022 0851   CL 106 11/25/2022 0851   CO2 25 11/25/2022 0851   GLUCOSE 85 11/25/2022 0851   BUN 18 11/25/2022 0851   CREATININE 0.67 11/25/2022 0851   CREATININE 0.95 08/23/2022 1631   CALCIUM 7.5 (L) 11/25/2022 0851   PROT 8.1 11/25/2022 0851   ALBUMIN 3.0 (L) 11/25/2022 0851   AST 14 (L) 11/25/2022 0851   ALT 14 11/25/2022 0851   ALKPHOS 39 11/25/2022 0851   BILITOT 0.4 11/25/2022 0851   GFRNONAA >60 11/25/2022 0851   GFRAA >60 02/09/2016 0525    Total encounter time: 30 min  *Total Encounter Time as defined by the Centers for Medicare and Medicaid Services includes, in addition to the face-to-face time of a patient visit (documented in the note above) non-face-to-face time: obtaining and reviewing outside history, ordering and reviewing medications, tests or procedures, care coordination (communications with other health care professionals or caregivers) and documentation in the medical record.

## 2022-12-03 ENCOUNTER — Other Ambulatory Visit: Payer: Self-pay

## 2022-12-03 ENCOUNTER — Ambulatory Visit (HOSPITAL_COMMUNITY)
Admission: RE | Admit: 2022-12-03 | Discharge: 2022-12-03 | Disposition: A | Discharge status: Home / Self Care | Payer: 59 | Source: Ambulatory Visit | Attending: Hematology and Oncology | Admitting: Hematology and Oncology

## 2022-12-03 ENCOUNTER — Encounter (HOSPITAL_COMMUNITY): Payer: Self-pay

## 2022-12-03 DIAGNOSIS — D472 Monoclonal gammopathy: Secondary | ICD-10-CM | POA: Diagnosis not present

## 2022-12-03 DIAGNOSIS — C9000 Multiple myeloma not having achieved remission: Secondary | ICD-10-CM | POA: Diagnosis not present

## 2022-12-03 DIAGNOSIS — I10 Essential (primary) hypertension: Secondary | ICD-10-CM | POA: Insufficient documentation

## 2022-12-03 HISTORY — PX: IR IMAGING GUIDED PORT INSERTION: IMG5740

## 2022-12-03 LAB — MULTIPLE MYELOMA PANEL, SERUM
Albumin SerPl Elph-Mcnc: 2.9 g/dL (ref 2.9–4.4)
Albumin/Glob SerPl: 0.6 — ABNORMAL LOW (ref 0.7–1.7)
Alpha 1: 0.4 g/dL (ref 0.0–0.4)
Alpha2 Glob SerPl Elph-Mcnc: 1 g/dL (ref 0.4–1.0)
B-Globulin SerPl Elph-Mcnc: 0.8 g/dL (ref 0.7–1.3)
Gamma Glob SerPl Elph-Mcnc: 2.8 g/dL — ABNORMAL HIGH (ref 0.4–1.8)
Globulin, Total: 5 g/dL — ABNORMAL HIGH (ref 2.2–3.9)
IgA: 133 mg/dL (ref 87–352)
IgG (Immunoglobin G), Serum: 3901 mg/dL — ABNORMAL HIGH (ref 586–1602)
IgM (Immunoglobulin M), Srm: 31 mg/dL (ref 26–217)
M Protein SerPl Elph-Mcnc: 2.5 g/dL — ABNORMAL HIGH
Total Protein ELP: 7.9 g/dL (ref 6.0–8.5)

## 2022-12-03 MED ORDER — HEPARIN SOD (PORK) LOCK FLUSH 100 UNIT/ML IV SOLN
500.0000 [IU] | Freq: Once | INTRAVENOUS | Status: AC
  Administered 2022-12-03: 500 [IU]

## 2022-12-03 MED ORDER — HEPARIN SOD (PORK) LOCK FLUSH 100 UNIT/ML IV SOLN
INTRAVENOUS | Status: AC
  Filled 2022-12-03: qty 5

## 2022-12-03 MED ORDER — MIDAZOLAM HCL 2 MG/2ML IJ SOLN
INTRAMUSCULAR | Status: AC
  Filled 2022-12-03: qty 2

## 2022-12-03 MED ORDER — FENTANYL CITRATE (PF) 100 MCG/2ML IJ SOLN
INTRAMUSCULAR | Status: AC
  Filled 2022-12-03: qty 2

## 2022-12-03 MED ORDER — LIDOCAINE-EPINEPHRINE 1 %-1:100000 IJ SOLN
20.0000 mL | Freq: Once | INTRAMUSCULAR | Status: AC
  Administered 2022-12-03: 20 mL

## 2022-12-03 MED ORDER — FENTANYL CITRATE (PF) 100 MCG/2ML IJ SOLN
INTRAMUSCULAR | Status: AC | PRN
  Administered 2022-12-03 (×3): 50 ug via INTRAVENOUS

## 2022-12-03 MED ORDER — SODIUM CHLORIDE 0.9 % IV SOLN: INTRAVENOUS | Status: DC

## 2022-12-03 MED ORDER — LIDOCAINE-EPINEPHRINE 1 %-1:100000 IJ SOLN
INTRAMUSCULAR | Status: AC
  Filled 2022-12-03: qty 1

## 2022-12-03 MED ORDER — MIDAZOLAM HCL 2 MG/2ML IJ SOLN
INTRAMUSCULAR | Status: AC | PRN
  Administered 2022-12-03 (×3): 1 mg via INTRAVENOUS

## 2022-12-03 NOTE — Procedures (Signed)
Vascular and Interventional Radiology Procedure Note  Patient: Kara Medina DOB: Sep 16, 1975 Medical Record Number: [id number] Note Date/Time: 12/03/22 1:45 PM   Performing Physician: Roanna Banning, MD Assistant(s): None  Diagnosis:  Myeloma. Dual lumen request per Provider  Procedure: PORT PLACEMENT  Anesthesia: Conscious Sedation Complications: None Estimated Blood Loss: Minimal  Findings:  Successful right-sided DL port placement, with the tip of the catheter in the proximal right atrium.  Plan: Catheter ready for use.  See detailed procedure note with images in PACS. The patient tolerated the procedure well without incident or complication and was returned to Recovery in stable condition.    Roanna Banning, MD [facility]   Pager. [phone] Clinic. [phone]

## 2022-12-03 NOTE — H&P (Signed)
Chief Complaint: Patient was seen in consultation today for multiple myeloma, poor venous access at the request of Iruku,Praveena  Referring Physician(s): Iruku,Praveena  Supervising Physician: Roanna Banning  Patient Status: [facility] - Out-pt  History of Present Illness:  Kara Medina is a 47 y.o. female followed by oncology for multiple myeloma diagnosed 09/12/2022.  Patient is currently on chemotherapy treatment and has poor venous access.  She has been referred to IR for tunneled catheter with dual-lumen port placement.   Denies chills, fever, cough, SOB, CP, abdominal pain, N/V, dizziness or headaches. She endorses tremor to right hand. She is n.p.o. per order.  Past Medical History:  Diagnosis Date   Anemia    Gestational diabetes    diet controlled   Postpartum care following vaginal delivery (7/27) 02/09/2016   Pregnancy induced hypertension    Trigeminal neuralgia    2012 corrected with surgery    Past Surgical History:  Procedure Laterality Date   APPENDECTOMY  2008   TRIGEMINAL NERVE DECOMPRESSION  2012    Allergies: Amoxicillin  Medications: Prior to Admission medications   Medication Sig Start Date End Date Taking? Authorizing Provider  amLODipine (NORVASC) 5 MG tablet Take 1 tablet (5 mg total) by mouth daily. 08/29/22   Christell Constant, MD  aspirin EC 81 MG tablet Take 81 mg by mouth daily. Swallow whole.    [provider]  Blood Pressure Monitoring (BLOOD PRESSURE CUFF) MISC 1 Device by Does not apply route daily. 08/23/22   [name], MD  calcium carbonate (TUMS EX) 750 MG chewable tablet Chew 2 tablets by mouth 2 (two) times daily.    [provider]  cyclobenzaprine (FLEXERIL) 5 MG tablet Take 1 tablet (5 mg total) by mouth 3 (three) times daily as needed for muscle spasms. 09/27/22   Rachel Moulds, MD  ibuprofen (ADVIL) 200 MG tablet Take 400 mg by mouth every 6 (six) hours as needed for headache or moderate pain.     [provider]  labetalol (NORMODYNE) 200 MG tablet Take 1 tablet (200 mg total) by mouth 2 (two) times daily. 10/08/22   Chandrasekhar, [place] Dragon A, MD  lenalidomide (REVLIMID) 15 MG capsule Take 1 capsule (15 mg total) by mouth daily. Take for 14 days, hold for 7 days. Repeat every 21 days. 11/25/22   Rachel Moulds, MD  Multiple Vitamin (MULTIVITAMIN ADULT PO) Take by mouth.    [provider]  traMADol (ULTRAM) 50 MG tablet Take 1 tablet (50 mg total) by mouth every 12 (twelve) hours as needed. 09/27/22   Rachel Moulds, MD     Family History  Problem Relation Age of Onset   Cancer Other    Hypertension Other    Healthy Father    Cancer Paternal Grandfather        stomach, lung   Hypertension Mother    Breast cancer Mother 21   Hypertension Maternal Grandmother    Arthritis Maternal Grandmother    Hypertension Maternal Grandfather    Other Brother        ?prediabetic   Diabetes Paternal Grandmother    Hypertension Paternal Grandmother    Autism Son     Social History   Socioeconomic History   Marital status: Married    Spouse name: Not on file   Number of children: Not on file   Years of education: Not on file   Highest education level: Bachelor's degree (e.g., BA, AB, BS)  Occupational History   Not on file  Tobacco Use   Smoking status: Never   Smokeless tobacco: Never  Vaping Use   Vaping Use: Never used  Substance and Sexual Activity   Alcohol use: No   Drug use: No   Sexual activity: Yes  Other Topics Concern   Not on file  Social History Narrative   Not on file   Social Determinants of Health   Financial Resource Strain: Low Risk  (08/23/2022)   Overall Financial Resource Strain (CARDIA)    Difficulty of Paying Living Expenses: Not hard at all  Food Insecurity: No Food Insecurity (08/23/2022)   Hunger Vital Sign    Worried About Running Out of Food in the Last Year: Never true    Ran Out of Food in the Last Year: Never true   Transportation Needs: No Transportation Needs (08/23/2022)   PRAPARE - Administrator, Civil Service (Medical): No    Lack of Transportation (Non-Medical): No  Physical Activity: Insufficiently Active (08/23/2022)   Exercise Vital Sign    Days of Exercise per Week: 2 days    Minutes of Exercise per Session: 30 min  Stress: Stress Concern Present (08/23/2022)   [employer] of Occupational Health - Occupational Stress Questionnaire    Feeling of Stress : To some extent  Social Connections: Unknown (08/23/2022)   Social Connection and Isolation Panel [NHANES]    Frequency of Communication with Friends and Family: More than three times a week    Frequency of Social Gatherings with Friends and Family: Once a week    Attends Religious Services: Patient declined    Database administrator or Organizations: Yes    Attends Engineer, structural: More than 4 times per year    Marital Status: Married    Review of Systems: A 12 point ROS discussed and pertinent positives are indicated in the HPI above.  All other systems are negative.  Review of Systems  Constitutional:  Negative for chills and fever.  Respiratory:  Negative for cough and shortness of breath.   Cardiovascular:  Negative for chest pain.  Gastrointestinal:  Negative for abdominal pain, nausea and vomiting.  Neurological:  Positive for tremors. Negative for dizziness and headaches.    Vital Signs: BP (!) 141/88   Pulse 86   Temp 98.4 F (36.9 C) (Oral)   Resp 16   Ht 5\' 6"  (1.676 m)   Wt 229 lb 4.5 oz (104 kg)   SpO2 99%   BMI 37.01 kg/m     Physical Exam Vitals reviewed.  Constitutional:      General: She is not in acute distress.    Appearance: Normal appearance. She is not ill-appearing.  HENT:     Head: Normocephalic and atraumatic.     Mouth/Throat:     Mouth: Mucous membranes are dry.     Pharynx: Oropharynx is clear.  Cardiovascular:     Rate and Rhythm: Normal rate and regular  rhythm.     Pulses: Normal pulses.     Heart sounds: Normal heart sounds.  Pulmonary:     Effort: Pulmonary effort is normal. No respiratory distress.     Breath sounds: Normal breath sounds.  Abdominal:     General: Bowel sounds are normal. There is no distension.     Palpations: Abdomen is soft.     Tenderness: There is no abdominal tenderness. There is no guarding.  Musculoskeletal:     Right lower leg: No edema.     Left lower leg:  No edema.  Skin:    General: Skin is warm and dry.  Neurological:     Mental Status: She is alert and oriented to person, place, and time.  Psychiatric:        Mood and Affect: Mood normal.        Behavior: Behavior normal.        Thought Content: Thought content normal.        Judgment: Judgment normal.     Imaging: No results found.  Labs:  CBC: Recent Labs    11/11/22 0849 11/18/22 0859 11/25/22 0851 12/02/22 0758  WBC 10.8* 11.2* 6.2 10.1  HGB 8.6* 9.0* 8.2* 8.9*  HCT 27.6* 28.8* 26.2* 28.9*  PLT 302 266 393 369    COAGS: No results for input(s): "INR", "APTT" in the last 8760 hours.  BMP: Recent Labs    11/11/22 0849 11/18/22 0859 11/25/22 0851 12/02/22 0758  NA 136 136 138 137  K 3.8 3.4* 3.3* 3.7  CL 106 105 106 107  CO2 26 27 25 27   GLUCOSE 79 88 85 84  BUN 22* 17 18 16   CALCIUM 8.0* 7.6* 7.5* 7.5*  CREATININE 0.70 0.66 0.67 0.62  GFRNONAA >60 >60 >60 >60    LIVER FUNCTION TESTS: Recent Labs    11/11/22 0849 11/18/22 0859 11/25/22 0851 12/02/22 0758  BILITOT 0.4 0.4 0.4 0.4  AST 11* 12* 14* 18  ALT 13 18 14 19   ALKPHOS 54 49 39 47  PROT 8.6* 8.3* 8.1 7.9  ALBUMIN 2.8* 3.0* 3.0* 3.2*    TUMOR MARKERS: No results for input(s): "AFPTM", "CEA", "CA199", "CHROMGRNA" in the last 8760 hours.  Assessment and Plan:  47 year old female with PMHx significant for HTN, multiple myeloma, MGUS, IDA and LVH.  Presents to IR for tunneled catheter with dual-lumen port placement due to poor venous access and need  for chemotherapy.  Patient resting on stretcher. She is alert and oriented, calm and pleasant. She is in no distress.  Risks and benefits of image guided tunneled catheter with port placement moderate sedation was discussed with the patient including, but not limited to bleeding, infection, pneumothorax, or fibrin sheath development and need for additional procedures.  All of the patient's questions were answered, patient is agreeable to proceed. Consent signed and in chart.  Thank you for this interesting consult.  I greatly enjoyed meeting Kara Medina and look forward to participating in their care.  A copy of this report was sent to the requesting provider on this date.  Electronically Signed: Shon Hough, NP 12/03/2022, 11:49 AM   I spent a total of 20 minutes in face to face in clinical consultation, greater than 50% of which was counseling/coordinating care for multiple myeloma, poor venous access.

## 2022-12-03 NOTE — Discharge Instructions (Signed)
For questions /concerns may call Interventional Radiology at [phone] or  Interventional Radiology clinic [phone]   You may remove your dressing and shower tomorrow afternoon  DO NOT use EMLA cream for 2 weeks after port placement as the cream will remove surgical glue on your incision.    Implanted Port Insertion, Care After This sheet gives you information about how to care for yourself after your procedure. Your health care provider may also give you more specific instructions. If you have problems or questions, contact your health careprovider. What can I expect after the procedure? After the procedure, it is common to have: Discomfort at the port insertion site. Bruising on the skin over the port. This should improve over 3-4 days. Follow these instructions at home: Port care After your port is placed, you will get a manufacturer's information card. The card has information about your port. Keep this card with you at all times. Take care of the port as told by your health care provider. Ask your health care provider if you or a family member can get training for taking care of the port at home. A home health care nurse may also take care of the port. Make sure to remember what type of port you have. Incision care Follow instructions from your health care provider about how to take care of your port insertion site. Make sure you: Wash your hands with soap and water before and after you change your bandage (dressing). If soap and water are not available, use hand sanitizer. Change your dressing as told by your health care provider. Leave skin glue, or adhesive strips in place. These skin closures may need to stay in place for 2 weeks or longer.  Check your port insertion site every day for signs of infection. Check for:      - Redness, swelling, or pain.                     - Fluid or blood.      - Warmth.      - Pus or a bad smell. Activity Return to your normal activities as  told by your health care provider. Ask your health care provider what activities are safe for you. Do not lift anything that is heavier than 10 lb (4.5 kg), or the limit that you are told, until your health care provider says that it is safe. General instructions Take over-the-counter and prescription medicines only as told by your health care provider. Do not take baths, swim, or use a hot tub until your health care provider approves. Ask your health care provider if you may take showers. You may only be allowed to take sponge baths. Do not drive for 24 hours if you were given a sedative during your procedure. Wear a medical alert bracelet in case of an emergency. This will tell any health care providers that you have a port. Keep all follow-up visits as told by your health care provider. This is important. Contact a health care provider if: You cannot flush your port with saline as directed, or you cannot draw blood from the port. You have a fever or chills. You have redness, swelling, or pain around your port insertion site. You have fluid or blood coming from your port insertion site. Your port insertion site feels warm to the touch. You have pus or a bad smell coming from the port insertion site. Get help right away if: You have chest pain or shortness of   breath. You have bleeding from your port that you cannot control. Summary Take care of the port as told by your health care provider. Keep the manufacturer's information card with you at all times. Change your dressing as told by your health care provider. Contact a health care provider if you have a fever or chills or if you have redness, swelling, or pain around your port insertion site. Keep all follow-up visits as told by your health care provider. This information is not intended to replace advice given to you by your health care provider. Make sure you discuss any questions you have with your healthcare provider. Document Revised:  01/27/2018 Document Reviewed: 01/27/2018    Moderate Conscious Sedation, Adult, Care After This sheet gives you information about how to care for yourself after your procedure. Your health care provider may also give you more specific instructions. If you have problems or questions, contact your health careprovider. What can I expect after the procedure? After the procedure, it is common to have: Sleepiness for several hours. Impaired judgment for several hours. Difficulty with balance. Vomiting if you eat too soon. Follow these instructions at home: For the time period you were told by your health care provider: Rest. Do not participate in activities where you could fall or become injured. Do not drive or use machinery. Do not drink alcohol. Do not take sleeping pills or medicines that cause drowsiness. Do not make important decisions or sign legal documents. Do not take care of children on your own. Eating and drinking  Follow the diet recommended by your health care provider. Drink enough fluid to keep your urine pale yellow. If you vomit: Drink water, juice, or soup when you can drink without vomiting. Make sure you have little or no nausea before eating solid foods.  General instructions Take over-the-counter and prescription medicines only as told by your health care provider. Have a responsible adult stay with you for the time you are told. It is important to have someone help care for you until you are awake and alert. Do not smoke. Keep all follow-up visits as told by your health care provider. This is important. Contact a health care provider if: You are still sleepy or having trouble with balance after 24 hours. You feel light-headed. You keep feeling nauseous or you keep vomiting. You develop a rash. You have a fever. You have redness or swelling around the IV site. Get help right away if: You have trouble breathing. You have new-onset confusion at  home. Summary After the procedure, it is common to feel sleepy, have impaired judgment, or feel nauseous if you eat too soon. Rest after you get home. Know the things you should not do after the procedure. Follow the diet recommended by your health care provider and drink enough fluid to keep your urine pale yellow. Get help right away if you have trouble breathing or new-onset confusion at home. This information is not intended to replace advice given to you by your health care provider. Make sure you discuss any questions you have with your healthcare provider. Document Revised: 10/29/2019 Document Reviewed: 05/27/2019 Elsevier Patient Education  2022 Elsevier Inc.  

## 2022-12-04 LAB — IFE, DARA-SPECIFIC, SERUM
IgA: 126 mg/dL (ref 87–352)
IgG (Immunoglobin G), Serum: 3640 mg/dL — ABNORMAL HIGH (ref 586–1602)
IgM (Immunoglobulin M), Srm: 35 mg/dL (ref 26–217)

## 2022-12-05 ENCOUNTER — Inpatient Hospital Stay: Payer: 59

## 2022-12-05 VITALS — BP 131/83 | HR 100 | Temp 98.4°F | Resp 18 | Wt 227.8 lb

## 2022-12-05 DIAGNOSIS — Z5112 Encounter for antineoplastic immunotherapy: Secondary | ICD-10-CM | POA: Diagnosis not present

## 2022-12-05 DIAGNOSIS — C9000 Multiple myeloma not having achieved remission: Secondary | ICD-10-CM

## 2022-12-05 DIAGNOSIS — D472 Monoclonal gammopathy: Secondary | ICD-10-CM

## 2022-12-05 MED ORDER — BORTEZOMIB CHEMO SQ INJECTION 3.5 MG (2.5MG/ML)
1.3000 mg/m2 | Freq: Once | INTRAMUSCULAR | Status: AC
  Administered 2022-12-05: 2.75 mg via SUBCUTANEOUS
  Filled 2022-12-05: qty 1.1

## 2022-12-05 MED ORDER — PROCHLORPERAZINE MALEATE 10 MG PO TABS
10.0000 mg | ORAL_TABLET | Freq: Once | ORAL | Status: AC
  Administered 2022-12-05: 10 mg via ORAL
  Filled 2022-12-05: qty 1

## 2022-12-05 NOTE — Patient Instructions (Signed)
[facility]  Discharge Instructions: Thank you for choosing [facility] to provide your oncology and hematology care.   If you have a lab appointment with the Cancer Center, please go directly to the Cancer Center and check in at the registration area.   Wear comfortable clothing and clothing appropriate for easy access to any Portacath or PICC line.   We strive to give you quality time with your provider. You may need to reschedule your appointment if you arrive late (15 or more minutes).  Arriving late affects you and other patients whose appointments are after yours.  Also, if you miss three or more appointments without notifying the office, you may be dismissed from the clinic at the provider's discretion.      For prescription refill requests, have your pharmacy contact our office and allow 72 hours for refills to be completed.    Today you received the following chemotherapy and/or immunotherapy agents: Velcade     To help prevent nausea and vomiting after your treatment, we encourage you to take your nausea medication as directed.  BELOW ARE SYMPTOMS THAT SHOULD BE REPORTED IMMEDIATELY: *FEVER GREATER THAN 100.4 F (38 C) OR HIGHER *CHILLS OR SWEATING *NAUSEA AND VOMITING THAT IS NOT CONTROLLED WITH YOUR NAUSEA MEDICATION *UNUSUAL SHORTNESS OF BREATH *UNUSUAL BRUISING OR BLEEDING *URINARY PROBLEMS (pain or burning when urinating, or frequent urination) *BOWEL PROBLEMS (unusual diarrhea, constipation, pain near the anus) TENDERNESS IN MOUTH AND THROAT WITH OR WITHOUT PRESENCE OF ULCERS (sore throat, sores in mouth, or a toothache) UNUSUAL RASH, SWELLING OR PAIN  UNUSUAL VAGINAL DISCHARGE OR ITCHING   Items with * indicate a potential emergency and should be followed up as soon as possible or go to the Emergency Department if any problems should occur.  Please show the CHEMOTHERAPY ALERT CARD or IMMUNOTHERAPY ALERT CARD at check-in  to the Emergency Department and triage nurse.  Should you have questions after your visit or need to cancel or reschedule your appointment, please contact [facility]  Dept: [phone]  and follow the prompts.  Office hours are 8:00 a.m. to 4:30 p.m. Monday - Friday. Please note that voicemails left after 4:00 p.m. may not be returned until the following business day.  We are closed weekends and major holidays. You have access to a nurse at all times for urgent questions. Please call the main number to the clinic Dept: [phone] and follow the prompts.   For any non-urgent questions, you may also contact your provider using MyChart. We now offer e-Visits for anyone 18 and older to request care online for non-urgent symptoms. For details visit [email or web address].   Also download the MyChart app! Go to the app store, search "MyChart", open the app, select [facility], and log in with your MyChart username and password.   

## 2022-12-06 MED FILL — Dexamethasone Sodium Phosphate Inj 100 MG/10ML: INTRAMUSCULAR | Qty: 2 | Status: AC

## 2022-12-10 ENCOUNTER — Inpatient Hospital Stay: Payer: 59

## 2022-12-10 ENCOUNTER — Other Ambulatory Visit: Payer: 59

## 2022-12-10 ENCOUNTER — Encounter: Payer: Self-pay | Admitting: Hematology and Oncology

## 2022-12-10 ENCOUNTER — Telehealth: Payer: Self-pay | Admitting: *Deleted

## 2022-12-10 ENCOUNTER — Inpatient Hospital Stay (HOSPITAL_BASED_OUTPATIENT_CLINIC_OR_DEPARTMENT_OTHER): Payer: 59 | Admitting: Hematology and Oncology

## 2022-12-10 VITALS — BP 118/74 | HR 87 | Temp 98.6°F | Resp 17

## 2022-12-10 DIAGNOSIS — D649 Anemia, unspecified: Secondary | ICD-10-CM

## 2022-12-10 DIAGNOSIS — D472 Monoclonal gammopathy: Secondary | ICD-10-CM

## 2022-12-10 DIAGNOSIS — C9000 Multiple myeloma not having achieved remission: Secondary | ICD-10-CM

## 2022-12-10 DIAGNOSIS — Z5112 Encounter for antineoplastic immunotherapy: Secondary | ICD-10-CM | POA: Diagnosis not present

## 2022-12-10 LAB — COMPREHENSIVE METABOLIC PANEL
ALT: 14 U/L (ref 0–44)
AST: 12 U/L — ABNORMAL LOW (ref 15–41)
Albumin: 3.2 g/dL — ABNORMAL LOW (ref 3.5–5.0)
Alkaline Phosphatase: 39 U/L (ref 38–126)
Anion gap: 5 (ref 5–15)
BUN: 15 mg/dL (ref 6–20)
CO2: 26 mmol/L (ref 22–32)
Calcium: 8 mg/dL — ABNORMAL LOW (ref 8.9–10.3)
Chloride: 104 mmol/L (ref 98–111)
Creatinine, Ser: 0.6 mg/dL (ref 0.44–1.00)
GFR, Estimated: >60 mL/min (ref >60)
Glucose, Bld: 117 mg/dL — ABNORMAL HIGH (ref 70–99)
Potassium: 3.6 mmol/L (ref 3.5–5.1)
Sodium: 135 mmol/L (ref 135–145)
Total Bilirubin: 0.4 mg/dL (ref 0.3–1.2)
Total Protein: 7.6 g/dL (ref 6.5–8.1)

## 2022-12-10 LAB — CBC WITH DIFFERENTIAL/PLATELET
Abs Immature Granulocytes: 0.13 10*3/uL — ABNORMAL HIGH (ref 0.00–0.07)
Basophils Absolute: 0.1 10*3/uL (ref 0.0–0.1)
Basophils Relative: 1 %
Eosinophils Absolute: 0.2 10*3/uL (ref 0.0–0.5)
Eosinophils Relative: 2 %
HCT: 30.7 % — ABNORMAL LOW (ref 36.0–46.0)
Hemoglobin: 9.9 g/dL — ABNORMAL LOW (ref 12.0–15.0)
Immature Granulocytes: 1 %
Lymphocytes Relative: 22 %
Lymphs Abs: 2 10*3/uL (ref 0.7–4.0)
MCH: 30.3 pg (ref 26.0–34.0)
MCHC: 32.2 g/dL (ref 30.0–36.0)
MCV: 93.9 fL (ref 80.0–100.0)
Monocytes Absolute: 2.4 10*3/uL — ABNORMAL HIGH (ref 0.1–1.0)
Monocytes Relative: 26 %
Neutro Abs: 4.5 10*3/uL (ref 1.7–7.7)
Neutrophils Relative %: 48 %
Platelets: 238 10*3/uL (ref 150–400)
RBC: 3.27 MIL/uL — ABNORMAL LOW (ref 3.87–5.11)
RDW: 16.6 % — ABNORMAL HIGH (ref 11.5–15.5)
WBC: 9.2 10*3/uL (ref 4.0–10.5)
nRBC: 0 % (ref 0.0–0.2)

## 2022-12-10 MED ORDER — SODIUM CHLORIDE 0.9% FLUSH
10.0000 mL | INTRAVENOUS | Status: DC | PRN
  Administered 2022-12-10: 10 mL

## 2022-12-10 MED ORDER — HEPARIN SOD (PORK) LOCK FLUSH 100 UNIT/ML IV SOLN
500.0000 [IU] | Freq: Once | INTRAVENOUS | Status: AC | PRN
  Administered 2022-12-10: 500 [IU]

## 2022-12-10 MED ORDER — DIPHENHYDRAMINE HCL 25 MG PO CAPS
50.0000 mg | ORAL_CAPSULE | Freq: Once | ORAL | Status: AC
  Administered 2022-12-10: 50 mg via ORAL
  Filled 2022-12-10: qty 2

## 2022-12-10 MED ORDER — SODIUM CHLORIDE 0.9 % IV SOLN
16.0000 mg/kg | Freq: Once | INTRAVENOUS | Status: AC
  Administered 2022-12-10: 1600 mg via INTRAVENOUS
  Filled 2022-12-10: qty 80

## 2022-12-10 MED ORDER — SODIUM CHLORIDE 0.9 % IV SOLN: Freq: Once | INTRAVENOUS | Status: AC

## 2022-12-10 MED ORDER — SODIUM CHLORIDE 0.9 % IV SOLN
20.0000 mg | Freq: Once | INTRAVENOUS | Status: AC
  Administered 2022-12-10: 20 mg via INTRAVENOUS
  Filled 2022-12-10: qty 20

## 2022-12-10 MED ORDER — PROCHLORPERAZINE MALEATE 10 MG PO TABS
10.0000 mg | ORAL_TABLET | Freq: Four times a day (QID) | ORAL | Status: DC | PRN
  Administered 2022-12-10: 10 mg via ORAL
  Filled 2022-12-10: qty 1

## 2022-12-10 MED ORDER — ACETAMINOPHEN 325 MG PO TABS
650.0000 mg | ORAL_TABLET | Freq: Once | ORAL | Status: AC
  Administered 2022-12-10: 650 mg via ORAL
  Filled 2022-12-10: qty 2

## 2022-12-10 MED ORDER — BORTEZOMIB CHEMO SQ INJECTION 3.5 MG (2.5MG/ML)
1.3000 mg/m2 | Freq: Once | INTRAMUSCULAR | Status: AC
  Administered 2022-12-10: 2.75 mg via SUBCUTANEOUS
  Filled 2022-12-10: qty 1.1

## 2022-12-10 NOTE — Telephone Encounter (Signed)
Hi Valerie! This is Mattie with [facility]. I received your voicemail regarding this patient. I see Dr. Al Pimple actually put the referral in on 5/20 but it was set as "outgoing" and never put into a specific office so it never came to our workqueue. I went ahead and updated it so we can start working on that now and set it as urgent so we can try to work her in somewhere within the next 2 weeks. We are with [facility] as well, so you won't ever have to fax anything over to [place], the referral just needs to be sent to [place] internally. If you have any questions, let me know! Just wanted to give you an update! Have a great day

## 2022-12-10 NOTE — Patient Instructions (Signed)
[facility]  Discharge Instructions: Thank you for choosing [facility] to provide your oncology and hematology care.   If you have a lab appointment with the Cancer Center, please go directly to the Cancer Center and check in at the registration area.   Wear comfortable clothing and clothing appropriate for easy access to any Portacath or PICC line.   We strive to give you quality time with your provider. You may need to reschedule your appointment if you arrive late (15 or more minutes).  Arriving late affects you and other patients whose appointments are after yours.  Also, if you miss three or more appointments without notifying the office, you may be dismissed from the clinic at the provider's discretion.      For prescription refill requests, have your pharmacy contact our office and allow 72 hours for refills to be completed.    Today you received the following chemotherapy and/or immunotherapy agents: Daratumumab, Bortezomib      To help prevent nausea and vomiting after your treatment, we encourage you to take your nausea medication as directed.  BELOW ARE SYMPTOMS THAT SHOULD BE REPORTED IMMEDIATELY: *FEVER GREATER THAN 100.4 F (38 C) OR HIGHER *CHILLS OR SWEATING *NAUSEA AND VOMITING THAT IS NOT CONTROLLED WITH YOUR NAUSEA MEDICATION *UNUSUAL SHORTNESS OF BREATH *UNUSUAL BRUISING OR BLEEDING *URINARY PROBLEMS (pain or burning when urinating, or frequent urination) *BOWEL PROBLEMS (unusual diarrhea, constipation, pain near the anus) TENDERNESS IN MOUTH AND THROAT WITH OR WITHOUT PRESENCE OF ULCERS (sore throat, sores in mouth, or a toothache) UNUSUAL RASH, SWELLING OR PAIN  UNUSUAL VAGINAL DISCHARGE OR ITCHING   Items with * indicate a potential emergency and should be followed up as soon as possible or go to the Emergency Department if any problems should occur.  Please show the CHEMOTHERAPY ALERT CARD or IMMUNOTHERAPY ALERT  CARD at check-in to the Emergency Department and triage nurse.  Should you have questions after your visit or need to cancel or reschedule your appointment, please contact [facility]  Dept: [phone]  and follow the prompts.  Office hours are 8:00 a.m. to 4:30 p.m. Monday - Friday. Please note that voicemails left after 4:00 p.m. may not be returned until the following business day.  We are closed weekends and major holidays. You have access to a nurse at all times for urgent questions. Please call the main number to the clinic Dept: [phone] and follow the prompts.   For any non-urgent questions, you may also contact your provider using MyChart. We now offer e-Visits for anyone 86 and older to request care online for non-urgent symptoms. For details visit [email or web address].   Also download the MyChart app! Go to the app store, search "MyChart", open the app, select [facility], and log in with your MyChart username and password.

## 2022-12-10 NOTE — Progress Notes (Signed)
[facility] Cancer Follow up:    Kara Saint, MD [address] [place] [address]   ASSESSMENT & PLAN:    This is a very pleasant 47 year old female patient with a past medical history significant for gestational diabetes and trigeminal neuralgia corrected with surgery, hypertension referred to hematology for evaluation and recommendations regarding severe normocytic normochromic anemia.   We have reviewed her labs which showed severe anemia and elevated total protein.  SPEP showed 8.4 g/dL, IgG kappa.  Kappa lambda ratio at 110.89 No evidence of endorgan damage so far.   BMB confirmed Ig G kappa MM, bone survey pending scheduled for Wednesday. FISH for multiple myeloma showed a gain of chromosome 11, characterized as standard risk.  Cytogenetics showed normal female karyotype RISS-II, median survival 42 months.  We started her on daratumumab with Velcade and dexamethasone prior to her FISH results being available. She started cycle 1 day 1 of Darzalex/Velcade with dexamethasone on 09/20/2022.    From second cycle, we transitioned to VRD since she is a standard risk. She then was seen in [facility], they recommended that we transition back to dara VRD.  She is now here for follow up before planned cycle 2 Day 8. She is responding extremely well so far. Only adverse effect is worsening tremor likely from dex, will encourage neurology referral since dex is a key component of treatment. Ok to proceed with treatment as scheduled. No new dental problems, continue zometa as scheduled. She has a follow up with dentist in July. They are aware of zometa.   DIAGNOSIS:  Cancer Staging  No matching staging information was found for the patient.  SUMMARY OF ONCOLOGIC HISTORY: Oncology History  Multiple myeloma (HCC)  09/12/2022 Initial Diagnosis   Multiple myeloma (HCC)   09/20/2022 - 10/04/2022 Chemotherapy   Patient is on Treatment Plan : MYELOMA NEWLY DIAGNOSED TRANSPLANT  CANDIDATE DaraVRd (Daratumumab IV) q21d x 6 Cycles (Induction/Consolidation)     10/11/2022 - 10/22/2022 Chemotherapy   Patient is on Treatment Plan : MYELOMA NON-TRANSPLANT CANDIDATES VRd SQ q21d     11/04/2022 -  Chemotherapy   Patient is on Treatment Plan : MYELOMA NEWLY DIAGNOSED TRANSPLANT CANDIDATE DaraVRd (Daratumumab IV) q21d x 6 Cycles (Induction/Consolidation)       CURRENT THERAPY: Daratumumab, Velcade, Revlimid, Dexamethasone  INTERVAL HISTORY:  Kara Medina 47 y.o. female returns for follow-up and evaluation prior to Dara VRD.   She still complains of worsening tremors, otherwise she feels ok. Port is working well. Cough is better but present. No neuropathy reported.   Rest of the pertinent 10 point ROS reviewed and neg  Patient Active Problem List   Diagnosis Date Noted   HTN (hypertension) 11/14/2022   Multiple myeloma (HCC) 09/12/2022   MGUS (monoclonal gammopathy of unknown significance) 09/10/2022   Hypertensive urgency 08/29/2022   Iron deficiency anemia 08/29/2022   LVH (left ventricular hypertrophy) 08/29/2022   Normocytic anemia 08/29/2022    is allergic to amoxicillin.  MEDICAL HISTORY: Past Medical History:  Diagnosis Date   Anemia    Gestational diabetes    diet controlled   Postpartum care following vaginal delivery (7/27) 02/09/2016   Pregnancy induced hypertension    Trigeminal neuralgia    2012 corrected with surgery    SURGICAL HISTORY: Past Surgical History:  Procedure Laterality Date   APPENDECTOMY  2008   IR IMAGING GUIDED PORT INSERTION  12/03/2022   TRIGEMINAL NERVE DECOMPRESSION  2012    SOCIAL HISTORY: Social History  Socioeconomic History   Marital status: Married    Spouse name: Not on file   Number of children: Not on file   Years of education: Not on file   Highest education level: Bachelor's degree (e.g., BA, AB, BS)  Occupational History   Not on file  Tobacco Use   Smoking status: Never   Smokeless tobacco:  Never  Vaping Use   Vaping Use: Never used  Substance and Sexual Activity   Alcohol use: No   Drug use: No   Sexual activity: Yes  Other Topics Concern   Not on file  Social History Narrative   Not on file   Social Determinants of Health   Financial Resource Strain: Low Risk  (08/23/2022)   Overall Financial Resource Strain (CARDIA)    Difficulty of Paying Living Expenses: Not hard at all  Food Insecurity: No Food Insecurity (08/23/2022)   Hunger Vital Sign    Worried About Running Out of Food in the Last Year: Never true    Ran Out of Food in the Last Year: Never true  Transportation Needs: No Transportation Needs (08/23/2022)   PRAPARE - Administrator, Civil Service (Medical): No    Lack of Transportation (Non-Medical): No  Physical Activity: Insufficiently Active (08/23/2022)   Exercise Vital Sign    Days of Exercise per Week: 2 days    Minutes of Exercise per Session: 30 min  Stress: Stress Concern Present (08/23/2022)   [employer] of Occupational Health - Occupational Stress Questionnaire    Feeling of Stress : To some extent  Social Connections: Unknown (08/23/2022)   Social Connection and Isolation Panel [NHANES]    Frequency of Communication with Friends and Family: More than three times a week    Frequency of Social Gatherings with Friends and Family: Once a week    Attends Religious Services: Patient declined    Database administrator or Organizations: Yes    Attends Engineer, structural: More than 4 times per year    Marital Status: Married  Catering manager Violence: Not on file    FAMILY HISTORY: Family History  Problem Relation Age of Onset   Cancer Other    Hypertension Other    Healthy Father    Cancer Paternal Grandfather        stomach, lung   Hypertension Mother    Breast cancer Mother 52   Hypertension Maternal Grandmother    Arthritis Maternal Grandmother    Hypertension Maternal Grandfather    Other Brother         ?prediabetic   Diabetes Paternal Grandmother    Hypertension Paternal Grandmother    Autism Son     Review of Systems  Constitutional:  Positive for fatigue (mild but improving as her hemoglobin improves). Negative for appetite change, chills, fever and unexpected weight change.  HENT:   Negative for hearing loss, lump/mass and trouble swallowing.   Eyes:  Negative for eye problems and icterus.  Respiratory:  Negative for chest tightness, cough and shortness of breath.   Cardiovascular:  Negative for chest pain, leg swelling and palpitations.  Gastrointestinal:  Negative for abdominal distention, abdominal pain, constipation, diarrhea, nausea and vomiting.  Endocrine: Negative for hot flashes.  Genitourinary:  Negative for difficulty urinating.   Musculoskeletal:  Negative for arthralgias.  Skin:  Negative for itching and rash.  Neurological:  Negative for dizziness, extremity weakness, headaches and numbness.  Hematological:  Negative for adenopathy. Does not bruise/bleed easily.  Psychiatric/Behavioral:  Negative for depression. The patient is not nervous/anxious.       PHYSICAL EXAMINATION    Vitals:   12/10/22 0839  BP: 114/72  Pulse: 100  Resp: 18  Temp: 97.7 F (36.5 C)  SpO2: 99%    Physical Exam Constitutional:      Appearance: Normal appearance.  Pulmonary:     Effort: Pulmonary effort is normal.     Breath sounds: Normal breath sounds. No wheezing or rales.  Abdominal:     General: Abdomen is flat.     Palpations: Abdomen is soft.  Neurological:     General: No focal deficit present.     Mental Status: She is alert.     LABORATORY DATA:  CBC    Component Value Date/Time   WBC 9.2 12/10/2022 0821   RBC 3.27 (L) 12/10/2022 0821   HGB 9.9 (L) 12/10/2022 0821   HGB 8.2 (L) 11/25/2022 0851   HCT 30.7 (L) 12/10/2022 0821   HCT 24.4 (L) 09/04/2022 1037   PLT 238 12/10/2022 0821   PLT 393 11/25/2022 0851   MCV 93.9 12/10/2022 0821   MCH 30.3  12/10/2022 0821   MCHC 32.2 12/10/2022 0821   RDW 16.6 (H) 12/10/2022 0821   LYMPHSABS 2.0 12/10/2022 0821   MONOABS 2.4 (H) 12/10/2022 0821   EOSABS 0.2 12/10/2022 0821   BASOSABS 0.1 12/10/2022 0821    CMP     Component Value Date/Time   NA 137 12/02/2022 0758   K 3.7 12/02/2022 0758   CL 107 12/02/2022 0758   CO2 27 12/02/2022 0758   GLUCOSE 84 12/02/2022 0758   BUN 16 12/02/2022 0758   CREATININE 0.62 12/02/2022 0758   CREATININE 0.67 11/25/2022 0851   CREATININE 0.95 08/23/2022 1631   CALCIUM 7.5 (L) 12/02/2022 0758   PROT 7.9 12/02/2022 0758   ALBUMIN 3.2 (L) 12/02/2022 0758   AST 18 12/02/2022 0758   AST 14 (L) 11/25/2022 0851   ALT 19 12/02/2022 0758   ALT 14 11/25/2022 0851   ALKPHOS 47 12/02/2022 0758   BILITOT 0.4 12/02/2022 0758   BILITOT 0.4 11/25/2022 0851   GFRNONAA >60 12/02/2022 0758   GFRNONAA >60 11/25/2022 0851   GFRAA >60 02/09/2016 0525    Total encounter time: 30 min  *Total Encounter Time as defined by the Centers for Medicare and Medicaid Services includes, in addition to the face-to-face time of a patient visit (documented in the note above) non-face-to-face time: obtaining and reviewing outside history, ordering and reviewing medications, tests or procedures, care coordination (communications with other health care professionals or caregivers) and documentation in the medical record.

## 2022-12-10 NOTE — Telephone Encounter (Signed)
This RN called [facility] and left detailed VM on new pt scheduler per request- Also faxed data for urgent appt.

## 2022-12-10 NOTE — Progress Notes (Signed)
When attempting to remove PAC dressing, RN found dressing adhered to the skin glue covering incision above PAC.  Adhesive remover used to separate clear dressing from skin glue.  Portions of skin glue separated from patient skin in several places along incision.  Daphane Shepherd, PA, at chairside, examined incision.  Per PA instructions, RN applied Steristrips across incision and discharged patient.  Patient advised to monitor incision and contact [facility] if any signs of infection arise.

## 2022-12-11 ENCOUNTER — Encounter: Payer: Self-pay | Admitting: Hematology and Oncology

## 2022-12-12 ENCOUNTER — Other Ambulatory Visit: Payer: Self-pay

## 2022-12-13 ENCOUNTER — Inpatient Hospital Stay: Payer: 59

## 2022-12-13 ENCOUNTER — Other Ambulatory Visit: Payer: Self-pay | Admitting: *Deleted

## 2022-12-13 ENCOUNTER — Ambulatory Visit: Payer: 59

## 2022-12-13 VITALS — BP 135/93 | HR 90 | Temp 98.4°F | Resp 16 | Wt 230.4 lb

## 2022-12-13 DIAGNOSIS — Z5112 Encounter for antineoplastic immunotherapy: Secondary | ICD-10-CM | POA: Diagnosis not present

## 2022-12-13 DIAGNOSIS — C9000 Multiple myeloma not having achieved remission: Secondary | ICD-10-CM

## 2022-12-13 DIAGNOSIS — D472 Monoclonal gammopathy: Secondary | ICD-10-CM

## 2022-12-13 MED ORDER — BORTEZOMIB CHEMO SQ INJECTION 3.5 MG (2.5MG/ML)
1.3000 mg/m2 | Freq: Once | INTRAMUSCULAR | Status: AC
  Administered 2022-12-13: 2.75 mg via SUBCUTANEOUS
  Filled 2022-12-13: qty 1.1

## 2022-12-13 MED ORDER — PROCHLORPERAZINE MALEATE 10 MG PO TABS
10.0000 mg | ORAL_TABLET | Freq: Once | ORAL | Status: AC
  Administered 2022-12-13: 10 mg via ORAL
  Filled 2022-12-13: qty 1

## 2022-12-13 MED FILL — Dexamethasone Sodium Phosphate Inj 100 MG/10ML: INTRAMUSCULAR | Qty: 2 | Status: AC

## 2022-12-13 NOTE — Patient Instructions (Signed)
[facility]  Discharge Instructions: Thank you for choosing [facility] to provide your oncology and hematology care.   If you have a lab appointment with the Cancer Center, please go directly to the Cancer Center and check in at the registration area.   Wear comfortable clothing and clothing appropriate for easy access to any Portacath or PICC line.   We strive to give you quality time with your provider. You may need to reschedule your appointment if you arrive late (15 or more minutes).  Arriving late affects you and other patients whose appointments are after yours.  Also, if you miss three or more appointments without notifying the office, you may be dismissed from the clinic at the provider's discretion.      For prescription refill requests, have your pharmacy contact our office and allow 72 hours for refills to be completed.    Today you received the following chemotherapy and/or immunotherapy agents: Velcade     To help prevent nausea and vomiting after your treatment, we encourage you to take your nausea medication as directed.  BELOW ARE SYMPTOMS THAT SHOULD BE REPORTED IMMEDIATELY: *FEVER GREATER THAN 100.4 F (38 C) OR HIGHER *CHILLS OR SWEATING *NAUSEA AND VOMITING THAT IS NOT CONTROLLED WITH YOUR NAUSEA MEDICATION *UNUSUAL SHORTNESS OF BREATH *UNUSUAL BRUISING OR BLEEDING *URINARY PROBLEMS (pain or burning when urinating, or frequent urination) *BOWEL PROBLEMS (unusual diarrhea, constipation, pain near the anus) TENDERNESS IN MOUTH AND THROAT WITH OR WITHOUT PRESENCE OF ULCERS (sore throat, sores in mouth, or a toothache) UNUSUAL RASH, SWELLING OR PAIN  UNUSUAL VAGINAL DISCHARGE OR ITCHING   Items with * indicate a potential emergency and should be followed up as soon as possible or go to the Emergency Department if any problems should occur.  Please show the CHEMOTHERAPY ALERT CARD or IMMUNOTHERAPY ALERT CARD at check-in  to the Emergency Department and triage nurse.  Should you have questions after your visit or need to cancel or reschedule your appointment, please contact [facility]  Dept: [phone]  and follow the prompts.  Office hours are 8:00 a.m. to 4:30 p.m. Monday - Friday. Please note that voicemails left after 4:00 p.m. may not be returned until the following business day.  We are closed weekends and major holidays. You have access to a nurse at all times for urgent questions. Please call the main number to the clinic Dept: [phone] and follow the prompts.   For any non-urgent questions, you may also contact your provider using MyChart. We now offer e-Visits for anyone 18 and older to request care online for non-urgent symptoms. For details visit [email or web address].   Also download the MyChart app! Go to the app store, search "MyChart", open the app, select [facility], and log in with your MyChart username and password.   

## 2022-12-16 ENCOUNTER — Other Ambulatory Visit: Payer: 59

## 2022-12-16 ENCOUNTER — Inpatient Hospital Stay: Payer: 59

## 2022-12-16 ENCOUNTER — Other Ambulatory Visit: Payer: Self-pay

## 2022-12-16 ENCOUNTER — Inpatient Hospital Stay (HOSPITAL_BASED_OUTPATIENT_CLINIC_OR_DEPARTMENT_OTHER): Payer: 59 | Admitting: Adult Health

## 2022-12-16 ENCOUNTER — Encounter: Payer: Self-pay | Admitting: Adult Health

## 2022-12-16 ENCOUNTER — Inpatient Hospital Stay: Payer: 59 | Attending: Hematology and Oncology

## 2022-12-16 VITALS — BP 128/79 | HR 87 | Temp 99.2°F | Resp 20

## 2022-12-16 DIAGNOSIS — C9000 Multiple myeloma not having achieved remission: Secondary | ICD-10-CM | POA: Diagnosis present

## 2022-12-16 DIAGNOSIS — Z79899 Other long term (current) drug therapy: Secondary | ICD-10-CM | POA: Insufficient documentation

## 2022-12-16 DIAGNOSIS — D472 Monoclonal gammopathy: Secondary | ICD-10-CM

## 2022-12-16 DIAGNOSIS — Z5112 Encounter for antineoplastic immunotherapy: Secondary | ICD-10-CM | POA: Diagnosis present

## 2022-12-16 DIAGNOSIS — D649 Anemia, unspecified: Secondary | ICD-10-CM

## 2022-12-16 LAB — CBC WITH DIFFERENTIAL/PLATELET
Abs Immature Granulocytes: 0.13 10*3/uL — ABNORMAL HIGH (ref 0.00–0.07)
Basophils Absolute: 0 10*3/uL (ref 0.0–0.1)
Basophils Relative: 0 %
Eosinophils Absolute: 0 10*3/uL (ref 0.0–0.5)
Eosinophils Relative: 0 %
HCT: 29.1 % — ABNORMAL LOW (ref 36.0–46.0)
Hemoglobin: 9.3 g/dL — ABNORMAL LOW (ref 12.0–15.0)
Immature Granulocytes: 1 %
Lymphocytes Relative: 26 %
Lymphs Abs: 2.8 10*3/uL (ref 0.7–4.0)
MCH: 30.3 pg (ref 26.0–34.0)
MCHC: 32 g/dL (ref 30.0–36.0)
MCV: 94.8 fL (ref 80.0–100.0)
Monocytes Absolute: 1.7 10*3/uL — ABNORMAL HIGH (ref 0.1–1.0)
Monocytes Relative: 16 %
Neutro Abs: 6 10*3/uL (ref 1.7–7.7)
Neutrophils Relative %: 57 %
Platelets: 204 10*3/uL (ref 150–400)
RBC: 3.07 MIL/uL — ABNORMAL LOW (ref 3.87–5.11)
RDW: 17.3 % — ABNORMAL HIGH (ref 11.5–15.5)
WBC: 10.7 10*3/uL — ABNORMAL HIGH (ref 4.0–10.5)
nRBC: 0.7 % — ABNORMAL HIGH (ref 0.0–0.2)

## 2022-12-16 LAB — CMP (CANCER CENTER ONLY)
ALT: 21 U/L (ref 0–44)
AST: 13 U/L — ABNORMAL LOW (ref 15–41)
Albumin: 3.2 g/dL — ABNORMAL LOW (ref 3.5–5.0)
Alkaline Phosphatase: 47 U/L (ref 38–126)
Anion gap: 5 (ref 5–15)
BUN: 18 mg/dL (ref 6–20)
CO2: 27 mmol/L (ref 22–32)
Calcium: 8.2 mg/dL — ABNORMAL LOW (ref 8.9–10.3)
Chloride: 105 mmol/L (ref 98–111)
Creatinine: 0.8 mg/dL (ref 0.44–1.00)
GFR, Estimated: >60 mL/min (ref >60)
Glucose, Bld: 111 mg/dL — ABNORMAL HIGH (ref 70–99)
Potassium: 3.4 mmol/L — ABNORMAL LOW (ref 3.5–5.1)
Sodium: 137 mmol/L (ref 135–145)
Total Bilirubin: 0.4 mg/dL (ref 0.3–1.2)
Total Protein: 7.4 g/dL (ref 6.5–8.1)

## 2022-12-16 LAB — PREGNANCY, URINE: Preg Test, Ur: NEGATIVE

## 2022-12-16 MED ORDER — SODIUM CHLORIDE 0.9 % IV SOLN
20.0000 mg | Freq: Once | INTRAVENOUS | Status: AC
  Administered 2022-12-16: 20 mg via INTRAVENOUS
  Filled 2022-12-16: qty 20

## 2022-12-16 MED ORDER — LENALIDOMIDE 15 MG PO CAPS
15.0000 mg | ORAL_CAPSULE | Freq: Every day | ORAL | 0 refills | Status: DC
Start: 2022-12-16 — End: 2023-01-06

## 2022-12-16 MED ORDER — HEPARIN SOD (PORK) LOCK FLUSH 100 UNIT/ML IV SOLN
500.0000 [IU] | Freq: Once | INTRAVENOUS | Status: AC | PRN
  Administered 2022-12-16: 500 [IU]

## 2022-12-16 MED ORDER — SODIUM CHLORIDE 0.9% FLUSH
10.0000 mL | INTRAVENOUS | Status: DC | PRN
  Administered 2022-12-16: 10 mL

## 2022-12-16 MED ORDER — DIPHENHYDRAMINE HCL 25 MG PO CAPS
50.0000 mg | ORAL_CAPSULE | Freq: Once | ORAL | Status: AC
  Administered 2022-12-16: 50 mg via ORAL
  Filled 2022-12-16: qty 2

## 2022-12-16 MED ORDER — PROCHLORPERAZINE MALEATE 10 MG PO TABS
10.0000 mg | ORAL_TABLET | Freq: Four times a day (QID) | ORAL | Status: DC | PRN
  Administered 2022-12-16: 10 mg via ORAL
  Filled 2022-12-16: qty 1

## 2022-12-16 MED ORDER — ZOLEDRONIC ACID 4 MG/100ML IV SOLN
4.0000 mg | Freq: Once | INTRAVENOUS | Status: AC
  Administered 2022-12-16: 4 mg via INTRAVENOUS
  Filled 2022-12-16: qty 100

## 2022-12-16 MED ORDER — SODIUM CHLORIDE 0.9 % IV SOLN: Freq: Once | INTRAVENOUS | Status: AC

## 2022-12-16 MED ORDER — SODIUM CHLORIDE 0.9 % IV SOLN
16.0000 mg/kg | Freq: Once | INTRAVENOUS | Status: AC
  Administered 2022-12-16: 1600 mg via INTRAVENOUS
  Filled 2022-12-16: qty 80

## 2022-12-16 MED ORDER — ACETAMINOPHEN 325 MG PO TABS
650.0000 mg | ORAL_TABLET | Freq: Once | ORAL | Status: AC
  Administered 2022-12-16: 650 mg via ORAL
  Filled 2022-12-16: qty 2

## 2022-12-16 NOTE — Patient Instructions (Signed)
[facility]  Discharge Instructions: Thank you for choosing [facility] to provide your oncology and hematology care.   If you have a lab appointment with the Cancer Center, please go directly to the Cancer Center and check in at the registration area.   Wear comfortable clothing and clothing appropriate for easy access to any Portacath or PICC line.   We strive to give you quality time with your provider. You may need to reschedule your appointment if you arrive late (15 or more minutes).  Arriving late affects you and other patients whose appointments are after yours.  Also, if you miss three or more appointments without notifying the office, you may be dismissed from the clinic at the provider's discretion.      For prescription refill requests, have your pharmacy contact our office and allow 72 hours for refills to be completed.    Today you received the following chemotherapy and/or immunotherapy agent: Daratumumab (Darzalex)  To help prevent nausea and vomiting after your treatment, we encourage you to take your nausea medication as directed.  BELOW ARE SYMPTOMS THAT SHOULD BE REPORTED IMMEDIATELY: *FEVER GREATER THAN 100.4 F (38 C) OR HIGHER *CHILLS OR SWEATING *NAUSEA AND VOMITING THAT IS NOT CONTROLLED WITH YOUR NAUSEA MEDICATION *UNUSUAL SHORTNESS OF BREATH *UNUSUAL BRUISING OR BLEEDING *URINARY PROBLEMS (pain or burning when urinating, or frequent urination) *BOWEL PROBLEMS (unusual diarrhea, constipation, pain near the anus) TENDERNESS IN MOUTH AND THROAT WITH OR WITHOUT PRESENCE OF ULCERS (sore throat, sores in mouth, or a toothache) UNUSUAL RASH, SWELLING OR PAIN  UNUSUAL VAGINAL DISCHARGE OR ITCHING   Items with * indicate a potential emergency and should be followed up as soon as possible or go to the Emergency Department if any problems should occur.  Please show the CHEMOTHERAPY ALERT CARD or IMMUNOTHERAPY ALERT CARD  at check-in to the Emergency Department and triage nurse.  Should you have questions after your visit or need to cancel or reschedule your appointment, please contact [facility]  Dept: [phone]  and follow the prompts.  Office hours are 8:00 a.m. to 4:30 p.m. Monday - Friday. Please note that voicemails left after 4:00 p.m. may not be returned until the following business day.  We are closed weekends and major holidays. You have access to a nurse at all times for urgent questions. Please call the main number to the clinic Dept: [phone] and follow the prompts.   For any non-urgent questions, you may also contact your provider using MyChart. We now offer e-Visits for anyone 59 and older to request care online for non-urgent symptoms. For details visit [email or web address].   Also download the MyChart app! Go to the app store, search "MyChart", open the app, select [place], and log in with your MyChart username and password. Daratumumab Injection What is this medication? DARATUMUMAB (dar a toom ue mab) treats multiple myeloma, a type of bone marrow cancer. It works by helping your immune system slow or stop the spread of cancer cells. It is a monoclonal antibody. This medicine may be used for other purposes; ask your health care provider or pharmacist if you have questions. COMMON BRAND NAME(S): DARZALEX What should I tell my care team before I take this medication? They need to know if you have any of these conditions: Hereditary fructose intolerance Infection, such as chickenpox, herpes, hepatitis B Lung or breathing disease, such as asthma, COPD An unusual or allergic reaction  to daratumumab, sorbitol, other medications, foods, dyes, or preservatives Pregnant or trying to get pregnant Breastfeeding How should I use this medication? This medication is injected into a vein. It is given by your care team in a hospital or clinic setting. Talk  to your care team about the use of this medication in children. Special care may be needed. Overdosage: If you think you have taken too much of this medicine contact a poison control center or emergency room at once. NOTE: This medicine is only for you. Do not share this medicine with others. What if I miss a dose? Keep appointments for follow-up doses. It is important not to miss your dose. Call your care team if you are unable to keep an appointment. What may interact with this medication? Interactions have not been studied. This list may not describe all possible interactions. Give your health care provider a list of all the medicines, herbs, non-prescription drugs, or dietary supplements you use. Also tell them if you smoke, drink alcohol, or use illegal drugs. Some items may interact with your medicine. What should I watch for while using this medication? Your condition will be monitored carefully while you are receiving this medication. This medication can cause serious allergic reactions. To reduce your risk, your care team may give you other medication to take before receiving this one. Be sure to follow the directions from your care team. This medication can affect the results of blood tests to match your blood type. These changes can last for up to 6 months after the final dose. Your care team will do blood tests to match your blood type before you start treatment. Tell all of your care team that you are being treated with this medication before receiving a blood transfusion. This medication can affect the results of some tests used to determine treatment response; extra tests may be needed to evaluate response. Talk to your care team if you wish to become pregnant or think you are pregnant. This medication can cause serious birth defects if taken during pregnancy and for 3 months after the last dose. A reliable form of contraception is recommended while taking this medication and for 3 months  after the last dose. Talk to your care team about effective forms of contraception. Do not breast-feed while taking this medication. What side effects may I notice from receiving this medication? Side effects that you should report to your care team as soon as possible: Allergic reactions--skin rash, itching, hives, swelling of the face, lips, tongue, or throat Infection--fever, chills, cough, sore throat, wounds that don't heal, pain or trouble when passing urine, general feeling of discomfort or being unwell Infusion reactions--chest pain, shortness of breath or trouble breathing, feeling faint or lightheaded Unusual bruising or bleeding Side effects that usually do not require medical attention (report to your care team if they continue or are bothersome): Constipation Diarrhea Fatigue Nausea Pain, tingling, or numbness in the hands or feet Swelling of the ankles, hands, or feet This list may not describe all possible side effects. Call your doctor for medical advice about side effects. You may report side effects to FDA at 1-800-FDA-1088. Where should I keep my medication? This medication is given in a hospital or clinic. It will not be stored at home. NOTE: This sheet is a summary. It may not cover all possible information. If you have questions about this medicine, talk to your doctor, pharmacist, or health care provider.  2024 Elsevier/Gold Standard (2022-05-09 00:00:00)  Zoledronic Acid  Injection (Cancer) What is this medication? ZOLEDRONIC ACID (ZOE le dron ik AS id) treats high calcium levels in the blood caused by cancer. It may also be used with chemotherapy to treat weakened bones caused by cancer. It works by slowing down the release of calcium from bones. This lowers calcium levels in your blood. It also makes your bones stronger and less likely to break (fracture). It belongs to a group of medications called bisphosphonates. This medicine may be used for other purposes; ask  your health care provider or pharmacist if you have questions. COMMON BRAND NAME(S): Zometa, Zometa Powder What should I tell my care team before I take this medication? They need to know if you have any of these conditions: Dehydration Dental disease Kidney disease Liver disease Low levels of calcium in the blood Lung or breathing disease, such as asthma Receiving steroids, such as dexamethasone or prednisone An unusual or allergic reaction to zoledronic acid, other medications, foods, dyes, or preservatives Pregnant or trying to get pregnant Breast-feeding How should I use this medication? This medication is injected into a vein. It is given by your care team in a hospital or clinic setting. Talk to your care team about the use of this medication in children. Special care may be needed. Overdosage: If you think you have taken too much of this medicine contact a poison control center or emergency room at once. NOTE: This medicine is only for you. Do not share this medicine with others. What if I miss a dose? Keep appointments for follow-up doses. It is important not to miss your dose. Call your care team if you are unable to keep an appointment. What may interact with this medication? Certain antibiotics given by injection Diuretics, such as bumetanide, furosemide NSAIDs, medications for pain and inflammation, such as ibuprofen or naproxen Teriparatide Thalidomide This list may not describe all possible interactions. Give your health care provider a list of all the medicines, herbs, non-prescription drugs, or dietary supplements you use. Also tell them if you smoke, drink alcohol, or use illegal drugs. Some items may interact with your medicine. What should I watch for while using this medication? Visit your care team for regular checks on your progress. It may be some time before you see the benefit from this medication. Some people who take this medication have severe bone, joint, or  muscle pain. This medication may also increase your risk for jaw problems or a broken thigh bone. Tell your care team right away if you have severe pain in your jaw, bones, joints, or muscles. Tell you care team if you have any pain that does not go away or that gets worse. Tell your dentist and dental surgeon that you are taking this medication. You should not have major dental surgery while on this medication. See your dentist to have a dental exam and fix any dental problems before starting this medication. Take good care of your teeth while on this medication. Make sure you see your dentist for regular follow-up appointments. You should make sure you get enough calcium and vitamin D while you are taking this medication. Discuss the foods you eat and the vitamins you take with your care team. Check with your care team if you have severe diarrhea, nausea, and vomiting, or if you sweat a lot. The loss of too much body fluid may make it dangerous for you to take this medication. You may need bloodwork while taking this medication. Talk to your care team if you  wish to become pregnant or think you might be pregnant. This medication can cause serious birth defects. What side effects may I notice from receiving this medication? Side effects that you should report to your care team as soon as possible: Allergic reactions--skin rash, itching, hives, swelling of the face, lips, tongue, or throat Kidney injury--decrease in the amount of urine, swelling of the ankles, hands, or feet Low calcium level--muscle pain or cramps, confusion, tingling, or numbness in the hands or feet Osteonecrosis of the jaw--pain, swelling, or redness in the mouth, numbness of the jaw, poor healing after dental work, unusual discharge from the mouth, visible bones in the mouth Severe bone, joint, or muscle pain Side effects that usually do not require medical attention (report to your care team if they continue or are  bothersome): Constipation Fatigue Fever Loss of appetite Nausea Stomach pain This list may not describe all possible side effects. Call your doctor for medical advice about side effects. You may report side effects to FDA at 1-800-FDA-1088. Where should I keep my medication? This medication is given in a hospital or clinic. It will not be stored at home. NOTE: This sheet is a summary. It may not cover all possible information. If you have questions about this medicine, talk to your doctor, pharmacist, or health care provider.  2024 Elsevier/Gold Standard (2021-08-24 00:00:00)  Hypokalemia Hypokalemia means that the amount of potassium in the blood is lower than normal. Potassium is a mineral (electrolyte) that helps regulate the amount of fluid in the body. It also stimulates muscle tightening (contraction) and helps nerves work properly. Normally, most of the body's potassium is inside cells, and only a very small amount is in the blood. Because the amount in the blood is so small, minor changes to potassium levels in the blood can be life-threatening. What are the causes? This condition may be caused by: Antibiotic medicine. Diarrhea or vomiting. Taking too much of a medicine that helps you have a bowel movement (laxative) can cause diarrhea and lead to hypokalemia. Chronic kidney disease (CKD). Medicines that help the body get rid of excess fluid (diuretics). Eating disorders, such as anorexia or bulimia. Low magnesium levels in the body. Sweating a lot. What are the signs or symptoms? Symptoms of this condition include: Weakness. Constipation. Fatigue. Muscle cramps. Mental confusion. Skipped heartbeats or irregular heartbeat (palpitations). Tingling or numbness. How is this diagnosed? This condition is diagnosed with a blood test. How is this treated? This condition may be treated by: Taking potassium supplements. Adjusting the medicines that you take. Eating more foods  that contain a lot of potassium. If your potassium level is very low, you may need to get potassium through an IV and be monitored in the hospital. Follow these instructions at home: Eating and drinking  Eat a healthy diet. A healthy diet includes fresh fruits and vegetables, whole grains, healthy fats, and lean proteins. If told, eat more foods that contain a lot of potassium. These include: Nuts, such as peanuts and pistachios. Seeds, such as sunflower seeds and pumpkin seeds. Peas, lentils, and lima beans. Whole grain and bran cereals and breads. Fresh fruits and vegetables, such as apricots, avocado, bananas, cantaloupe, kiwi, oranges, tomatoes, asparagus, and potatoes. Juices, such as orange, tomato, and prune. Lean meats, including fish. Milk and milk products, such as yogurt. General instructions Take over-the-counter and prescription medicines only as told by your health care provider. This includes vitamins, natural food products, and supplements. Keep all follow-up visits. This is  important. Contact a health care provider if: You have weakness that gets worse. You feel your heart pounding or racing. You vomit. You have diarrhea. You have diabetes and you have trouble keeping your blood sugar in your target range. Get help right away if: You have chest pain. You have shortness of breath. You have vomiting or diarrhea that lasts for more than 2 days. You faint. These symptoms may be an emergency. Get help right away. Call 911. Do not wait to see if the symptoms will go away. Do not drive yourself to the hospital. Summary Hypokalemia means that the amount of potassium in the blood is lower than normal. This condition is diagnosed with a blood test. Hypokalemia may be treated by taking potassium supplements, adjusting the medicines that you take, or eating more foods that are high in potassium. If your potassium level is very low, you may need to get potassium through an IV  and be monitored in the hospital. This information is not intended to replace advice given to you by your health care provider. Make sure you discuss any questions you have with your health care provider. Document Revised: 03/15/2021 Document Reviewed: 03/15/2021 Elsevier Patient Education  2024 [employer].

## 2022-12-16 NOTE — Assessment & Plan Note (Addendum)
Kara Medina is a 47 year old woman with multiple myeloma here today for follow-up and evaluation prior to receiving daratumumab, Velcade, Revlimid, dexamethasone, Zometa.  Multiple myeloma: She is tolerating treatment well.  She will proceed with therapy today.  She is due for Zometa today. Hypocalcemia: calcium is 8.2 today.  She needs to continue calcium intake. Hypokalemia: potassium is 3.4 today, potassium rich foods encouraged. Fatigue: recommended continued energy conservation as she is doing. Port placement: Status postplacement on May 21.  She tolerated this well.  Kara Medina will return to clinic weekly for labs, follow-up, and treatment.

## 2022-12-16 NOTE — Progress Notes (Signed)
Zometa due today and pt. denies any dental issues or concerns.

## 2022-12-16 NOTE — Progress Notes (Signed)
[facility] Cancer Follow up:    Kara Saint, MD [address] [place] [address]   DIAGNOSIS:  Cancer Staging  No matching staging information was found for the patient.  SUMMARY OF ONCOLOGIC HISTORY: Oncology History  Multiple myeloma (HCC)  09/12/2022 Initial Diagnosis   Multiple myeloma (HCC)   09/20/2022 - 10/04/2022 Chemotherapy   Patient is on Treatment Plan : MYELOMA NEWLY DIAGNOSED TRANSPLANT CANDIDATE DaraVRd (Daratumumab IV) q21d x 6 Cycles (Induction/Consolidation)     10/11/2022 - 10/22/2022 Chemotherapy   Patient is on Treatment Plan : MYELOMA NON-TRANSPLANT CANDIDATES VRd SQ q21d     11/04/2022 -  Chemotherapy   Patient is on Treatment Plan : MYELOMA NEWLY DIAGNOSED TRANSPLANT CANDIDATE DaraVRd (Daratumumab IV) q21d x 6 Cycles (Induction/Consolidation)       CURRENT THERAPY: Daratumumab, Velcade, Revlimid, Dexamethasone; Zometa  INTERVAL HISTORY: Kara Medina 47 y.o. female returns for f/u on treatment with DaraVRD.  She is in her off week of the Revlimid.  She completed her pregnancy test this morning and is ready for her Revlimid to be refilled.  On 12/03/2022 she underwent a dual lumen powerport placement in interventional radiology.  She tolerated this placement well and is happy to not get stuck in her arms now.  She receives Zometa every 30 days and last received this on 11/14/2022.  She continues to see her dentist and is scheduled to undergo crown placement in July.     Patient Active Problem List   Diagnosis Date Noted   HTN (hypertension) 11/14/2022   Multiple myeloma (HCC) 09/12/2022   MGUS (monoclonal gammopathy of unknown significance) 09/10/2022   Hypertensive urgency 08/29/2022   Iron deficiency anemia 08/29/2022   LVH (left ventricular hypertrophy) 08/29/2022   Normocytic anemia 08/29/2022    is allergic to amoxicillin.  MEDICAL HISTORY: Past Medical History:  Diagnosis Date   Anemia    Gestational diabetes     diet controlled   Postpartum care following vaginal delivery (7/27) 02/09/2016   Pregnancy induced hypertension    Trigeminal neuralgia    2012 corrected with surgery    SURGICAL HISTORY: Past Surgical History:  Procedure Laterality Date   APPENDECTOMY  2008   IR IMAGING GUIDED PORT INSERTION  12/03/2022   TRIGEMINAL NERVE DECOMPRESSION  2012    SOCIAL HISTORY: Social History   Socioeconomic History   Marital status: Married    Spouse name: Not on file   Number of children: Not on file   Years of education: Not on file   Highest education level: Bachelor's degree (e.g., BA, AB, BS)  Occupational History   Not on file  Tobacco Use   Smoking status: Never   Smokeless tobacco: Never  Vaping Use   Vaping Use: Never used  Substance and Sexual Activity   Alcohol use: No   Drug use: No   Sexual activity: Yes  Other Topics Concern   Not on file  Social History Narrative   Not on file   Social Determinants of Health   Financial Resource Strain: Low Risk  (08/23/2022)   Overall Financial Resource Strain (CARDIA)    Difficulty of Paying Living Expenses: Not hard at all  Food Insecurity: No Food Insecurity (08/23/2022)   Hunger Vital Sign    Worried About Running Out of Food in the Last Year: Never true    Ran Out of Food in the Last Year: Never true  Transportation Needs: No Transportation Needs (08/23/2022)   PRAPARE - Transportation  Lack of Transportation (Medical): No    Lack of Transportation (Non-Medical): No  Physical Activity: Insufficiently Active (08/23/2022)   Exercise Vital Sign    Days of Exercise per Week: 2 days    Minutes of Exercise per Session: 30 min  Stress: Stress Concern Present (08/23/2022)   [employer] of Occupational Health - Occupational Stress Questionnaire    Feeling of Stress : To some extent  Social Connections: Unknown (08/23/2022)   Social Connection and Isolation Panel [NHANES]    Frequency of Communication with Friends and Family: More  than three times a week    Frequency of Social Gatherings with Friends and Family: Once a week    Attends Religious Services: Patient declined    Database administrator or Organizations: Yes    Attends Engineer, structural: More than 4 times per year    Marital Status: Married  Catering manager Violence: Not on file    FAMILY HISTORY: Family History  Problem Relation Age of Onset   Cancer Other    Hypertension Other    Healthy Father    Cancer Paternal Grandfather        stomach, lung   Hypertension Mother    Breast cancer Mother 38   Hypertension Maternal Grandmother    Arthritis Maternal Grandmother    Hypertension Maternal Grandfather    Other Brother        ?prediabetic   Diabetes Paternal Grandmother    Hypertension Paternal Grandmother    Autism Son     Review of Systems  Constitutional:  Positive for fatigue. Negative for appetite change, chills, fever and unexpected weight change.  HENT:   Negative for hearing loss, lump/mass and trouble swallowing.   Eyes:  Negative for eye problems and icterus.  Respiratory:  Negative for chest tightness, cough and shortness of breath.   Cardiovascular:  Negative for chest pain, leg swelling and palpitations.  Gastrointestinal:  Negative for abdominal distention, abdominal pain, constipation, diarrhea, nausea and vomiting.  Endocrine: Negative for hot flashes.  Genitourinary:  Negative for difficulty urinating.   Musculoskeletal:  Negative for arthralgias.  Skin:  Negative for itching and rash.  Neurological:  Negative for dizziness, extremity weakness, headaches and numbness.  Hematological:  Negative for adenopathy. Does not bruise/bleed easily.  Psychiatric/Behavioral:  Negative for depression. The patient is not nervous/anxious.       PHYSICAL EXAMINATION   Onc Performance Status - 12/16/22 0918       ECOG Perf Status   ECOG Perf Status Fully active, able to carry on all pre-disease performance without  restriction      KPS SCALE   KPS % SCORE Normal, no compliants, no evidence of disease             Vitals:   12/16/22 0915  BP: 130/79  Pulse: 79  Resp: 19  Temp: 98.1 F (36.7 C)  SpO2: 100%    Physical Exam Constitutional:      General: She is not in acute distress.    Appearance: Normal appearance. She is not toxic-appearing.  HENT:     Head: Normocephalic and atraumatic.     Mouth/Throat:     Mouth: Mucous membranes are moist.     Pharynx: Oropharynx is clear. No oropharyngeal exudate or posterior oropharyngeal erythema.  Eyes:     General: No scleral icterus. Cardiovascular:     Rate and Rhythm: Normal rate and regular rhythm.     Pulses: Normal pulses.     Heart  sounds: Normal heart sounds.  Pulmonary:     Effort: Pulmonary effort is normal.     Breath sounds: Normal breath sounds.  Abdominal:     General: Abdomen is flat. Bowel sounds are normal. There is no distension.     Palpations: Abdomen is soft.     Tenderness: There is no abdominal tenderness.  Musculoskeletal:        General: No swelling.     Cervical back: Neck supple.  Lymphadenopathy:     Cervical: No cervical adenopathy.  Skin:    General: Skin is warm and dry.     Findings: No rash.  Neurological:     General: No focal deficit present.     Mental Status: She is alert.  Psychiatric:        Mood and Affect: Mood normal.        Behavior: Behavior normal.     LABORATORY DATA:  CBC    Component Value Date/Time   WBC 10.7 (H) 12/16/2022 0851   RBC 3.07 (L) 12/16/2022 0851   HGB 9.3 (L) 12/16/2022 0851   HGB 8.2 (L) 11/25/2022 0851   HCT 29.1 (L) 12/16/2022 0851   HCT 24.4 (L) 09/04/2022 1037   PLT 204 12/16/2022 0851   PLT 393 11/25/2022 0851   MCV 94.8 12/16/2022 0851   MCH 30.3 12/16/2022 0851   MCHC 32.0 12/16/2022 0851   RDW 17.3 (H) 12/16/2022 0851   LYMPHSABS 2.8 12/16/2022 0851   MONOABS 1.7 (H) 12/16/2022 0851   EOSABS 0.0 12/16/2022 0851   BASOSABS 0.0 12/16/2022  0851    CMP     Component Value Date/Time   NA 137 12/16/2022 0851   K 3.4 (L) 12/16/2022 0851   CL 105 12/16/2022 0851   CO2 27 12/16/2022 0851   GLUCOSE 111 (H) 12/16/2022 0851   BUN 18 12/16/2022 0851   CREATININE 0.80 12/16/2022 0851   CREATININE 0.95 08/23/2022 1631   CALCIUM 8.2 (L) 12/16/2022 0851   PROT 7.4 12/16/2022 0851   ALBUMIN 3.2 (L) 12/16/2022 0851   AST 13 (L) 12/16/2022 0851   ALT 21 12/16/2022 0851   ALKPHOS 47 12/16/2022 0851   BILITOT 0.4 12/16/2022 0851   GFRNONAA >60 12/16/2022 0851   GFRAA >60 02/09/2016 0525      ASSESSMENT and THERAPY PLAN:   Multiple myeloma (HCC) Kara Medina is a 47 year old woman with multiple myeloma here today for follow-up and evaluation prior to receiving daratumumab, Velcade, Revlimid, dexamethasone, Zometa.  Multiple myeloma: She is tolerating treatment well.  She will proceed with therapy today.  She is due for Zometa today. Hypocalcemia: calcium is 8.2 today.  She needs to continue calcium intake. Hypokalemia: potassium is 3.4 today, potassium rich foods encouraged. Fatigue: recommended continued energy conservation as she is doing. Port placement: Status postplacement on May 21.  She tolerated this well.  Kara Medina will return to clinic weekly for labs, follow-up, and treatment.    All questions were answered. The patient knows to call the clinic with any problems, questions or concerns. We can certainly see the patient much sooner if necessary.  Total encounter time:20 minutes*in face-to-face visit time, chart review, lab review, care coordination, order entry, and documentation of the encounter time.  Lillard Anes, NP 12/16/22 10:01 AM Medical Oncology and Hematology [facility] [address] [place], [place] [address] Tel. [phone]    Fax. [phone]  *Total Encounter Time as defined by the Centers for Medicare and Medicaid Services includes, in addition to  the face-to-face time of a patient  visit (documented in the note above) non-face-to-face time: obtaining and reviewing outside history, ordering and reviewing medications, tests or procedures, care coordination (communications with other health care professionals or caregivers) and documentation in the medical record.

## 2022-12-17 ENCOUNTER — Other Ambulatory Visit: Payer: Self-pay | Admitting: *Deleted

## 2022-12-17 LAB — IGG, IGA, IGM
IgA: 88 mg/dL (ref 87–352)
IgG (Immunoglobin G), Serum: 2891 mg/dL — ABNORMAL HIGH (ref 586–1602)
IgM (Immunoglobulin M), Srm: 30 mg/dL (ref 26–217)

## 2022-12-19 ENCOUNTER — Encounter: Payer: Self-pay | Admitting: Neurology

## 2022-12-19 ENCOUNTER — Other Ambulatory Visit: Payer: Self-pay

## 2022-12-19 ENCOUNTER — Ambulatory Visit: Payer: 59

## 2022-12-19 ENCOUNTER — Ambulatory Visit (INDEPENDENT_AMBULATORY_CARE_PROVIDER_SITE_OTHER): Payer: 59 | Admitting: Neurology

## 2022-12-19 VITALS — BP 124/82 | HR 83 | Ht 66.0 in | Wt 213.0 lb

## 2022-12-19 DIAGNOSIS — F439 Reaction to severe stress, unspecified: Secondary | ICD-10-CM | POA: Diagnosis not present

## 2022-12-19 DIAGNOSIS — R351 Nocturia: Secondary | ICD-10-CM

## 2022-12-19 DIAGNOSIS — G4719 Other hypersomnia: Secondary | ICD-10-CM

## 2022-12-19 DIAGNOSIS — G4733 Obstructive sleep apnea (adult) (pediatric): Secondary | ICD-10-CM

## 2022-12-19 DIAGNOSIS — E669 Obesity, unspecified: Secondary | ICD-10-CM

## 2022-12-19 DIAGNOSIS — G250 Essential tremor: Secondary | ICD-10-CM

## 2022-12-19 NOTE — Progress Notes (Signed)
Subjective:    Patient ID: Kara Medina is a 47 y.o. female.  HPI    Huston Foley, MD, PhD [facility] [address] [address] [place], [place] [address]  Dear Dr. Al Pimple,   I saw your patient, Kara Medina, upon your kind request in my neurologic clinic today for initial consultation of her tremors.  The patient is unaccompanied today.  As you know, Kara Medina is a 47 year old female with an underlying medical history of anemia, multiple myeloma on chemotherapy, left ventricular hypertrophy, hypertension, sleep apnea, and obesity, who reports a longstanding history of bilateral hand tremors, essentially since she was in college.  Her tremors were mild in the beginning and have plateaued, not necessarily interfering with her day-to-day activities but they have become worse over time especially in the past year and particularly since she started chemotherapy recently for her multiple myeloma.  Chemotherapy has been successful thus far, she started chemo in March 2024 and is on a multidrug regimen, chemotherapy will be completed in August and then she will proceed with autologous bone marrow transplant as I understand, this will be done at [facility].  She does and does quite a bit of stress.   She is not aware of any family history of tremors, her parents are alive, neither 1 have tremors, she has a younger brother and a younger sister, neither one of her siblings have tremors.  She has a total of 4 children, 41 year old, 20 year old twins and 51-year-old and none of her kids have tremors.  She lives with her family including husband and children.  She works full-time for [employer].  Her tremor interferes with her handwriting, she reports difficulty holding drinks in particular and spelling, difficulty with eating.  She has not been on medication for tremors but incidentally, she is on a beta-blocker currently.  She does not drink caffeine daily, she does not  drink any alcohol, she sleeps fairly.  She snores, she has significant nocturia about 2-3 times per average night actually was diagnosed several years ago with sleep apnea with a sleep study but did not pursue CPAP therapy at the time.  Her Epworth sleepiness score is 16 out of 24, fatigue severity score is 40 out of 63. I reviewed your office visit note from 12/02/2022.  She has regular blood work particularly CBC and CMP and regular checkup on her myeloma panel.  She had a vitamin B12 check on 09/04/2022, it was about 556.  TSH on 08/29/2022 was normal at 1.35. She had a recent head CT without contrast through [facility] emergency room on 10/21/2022 with indication of head trauma, lightheaded, syncope.  I reviewed the results: Impression: Negative noncontrasted CT appearance of the brain.  Acute on chronic sinusitis.   She had presented to the emergency room on 10/21/2022 with fatigue and an episode of near syncope.  Her Past Medical History Is Significant For: Past Medical History:  Diagnosis Date   Anemia    Gestational diabetes    diet controlled   Postpartum care following vaginal delivery (7/27) 02/09/2016   Pregnancy induced hypertension    Trigeminal neuralgia    2012 corrected with surgery    Her Past Surgical History Is Significant For: Past Surgical History:  Procedure Laterality Date   APPENDECTOMY  2008   IR IMAGING GUIDED PORT INSERTION  12/03/2022   TRIGEMINAL NERVE DECOMPRESSION  2012    Her Family History Is Significant For: Family History  Problem Relation  Age of Onset   Cancer Other    Hypertension Other    Healthy Father    Cancer Paternal Grandfather        stomach, lung   Hypertension Mother    Breast cancer Mother 61   Hypertension Maternal Grandmother    Arthritis Maternal Grandmother    Hypertension Maternal Grandfather    Other Brother        ?prediabetic   Diabetes Paternal Grandmother    Hypertension Paternal Grandmother    Autism Son      Her Social History Is Significant For: Social History   Socioeconomic History   Marital status: Married    Spouse name: Not on file   Number of children: Not on file   Years of education: Not on file   Highest education level: Bachelor's degree (e.g., BA, AB, BS)  Occupational History   Not on file  Tobacco Use   Smoking status: Never   Smokeless tobacco: Never  Vaping Use   Vaping Use: Never used  Substance and Sexual Activity   Alcohol use: No   Drug use: No   Sexual activity: Yes  Other Topics Concern   Not on file  Social History Narrative   Not on file   Social Determinants of Health   Financial Resource Strain: Low Risk  (08/23/2022)   Overall Financial Resource Strain (CARDIA)    Difficulty of Paying Living Expenses: Not hard at all  Food Insecurity: No Food Insecurity (08/23/2022)   Hunger Vital Sign    Worried About Running Out of Food in the Last Year: Never true    Ran Out of Food in the Last Year: Never true  Transportation Needs: No Transportation Needs (08/23/2022)   PRAPARE - Administrator, Civil Service (Medical): No    Lack of Transportation (Non-Medical): No  Physical Activity: Insufficiently Active (08/23/2022)   Exercise Vital Sign    Days of Exercise per Week: 2 days    Minutes of Exercise per Session: 30 min  Stress: Stress Concern Present (08/23/2022)   [employer] of Occupational Health - Occupational Stress Questionnaire    Feeling of Stress : To some extent  Social Connections: Unknown (08/23/2022)   Social Connection and Isolation Panel [NHANES]    Frequency of Communication with Friends and Family: More than three times a week    Frequency of Social Gatherings with Friends and Family: Once a week    Attends Religious Services: Patient declined    Database administrator or Organizations: Yes    Attends Engineer, structural: More than 4 times per year    Marital Status: Married    Her Allergies Are:  Allergies   Allergen Reactions   Amoxicillin Hives    Has patient had a PCN reaction causing immediate rash, facial/tongue/throat swelling, SOB or lightheadedness with hypotension: Yes Has patient had a PCN reaction causing severe rash involving mucus membranes or skin necrosis: No Has patient had a PCN reaction that required hospitalization No Has patient had a PCN reaction occurring within the last 10 years: No If all of the above answers are "NO", then may proceed with Cephalosporin use.   :   Her Current Medications Are:  Outpatient Encounter Medications as of 12/19/2022  Medication Sig   acyclovir (ZOVIRAX) 200 MG capsule Take 200 mg by mouth 2 (two) times daily.   amLODipine (NORVASC) 5 MG tablet Take 1 tablet (5 mg total) by mouth daily.   aspirin EC 81  MG tablet Take 81 mg by mouth daily. Swallow whole.   Blood Pressure Monitoring (BLOOD PRESSURE CUFF) MISC 1 Device by Does not apply route daily.   calcium carbonate (TUMS EX) 750 MG chewable tablet Chew 2 tablets by mouth 2 (two) times daily.   cyclobenzaprine (FLEXERIL) 5 MG tablet Take 1 tablet (5 mg total) by mouth 3 (three) times daily as needed for muscle spasms.   ibuprofen (ADVIL) 200 MG tablet Take 400 mg by mouth every 6 (six) hours as needed for headache or moderate pain.   labetalol (NORMODYNE) 200 MG tablet Take 1 tablet (200 mg total) by mouth 2 (two) times daily.   lenalidomide (REVLIMID) 15 MG capsule Take 1 capsule (15 mg total) by mouth daily. Take for 14 days, hold for 7 days. Repeat every 21 days.   Multiple Vitamin (MULTIVITAMIN ADULT PO) Take by mouth.   traMADol (ULTRAM) 50 MG tablet Take 1 tablet (50 mg total) by mouth every 12 (twelve) hours as needed.   No facility-administered encounter medications on file as of 12/19/2022.  :   Review of Systems:  Out of a complete 14 point review of systems, all are reviewed and negative with the exception of these symptoms as listed below:   Review of Systems  Neurological:         Patient states lately her tremors has gotten worse. Patient states she believe the new Rx medication is the cause of her tremors. Patient states she been deal with tremors for year and lately hold item or writing her name has gotten worse.    Objective:  Neurological Exam  Physical Exam Physical Examination:   Vitals:   12/19/22 0824  BP: 124/82  Pulse: 83    General Examination: The patient is a very pleasant 47 y.o. female in no acute distress. She appears well-developed and well-nourished and well groomed.   HEENT: Normocephalic, atraumatic, pupils are equal, round and reactive to light, extraocular tracking is good without limitation to gaze excursion or nystagmus noted. Hearing is grossly intact. Face is symmetric with normal facial animation. Speech is clear with no dysarthria noted. There is no hypophonia. There is no lip, neck/head, jaw or voice tremor. Neck is supple with full range of passive and active motion. There are no carotid bruits on auscultation.  She has a mask on, we briefly removed her mask for the oropharynx exam: mild mouth dryness, adequate dental hygiene and moderate to significant airway crowding noted secondary to small airway, thicker soft palate, prominent uvula and tonsillar size of 2-3+ bilaterally.  Mallampati class II, neck circumference 15-7/8 inches.  Tongue protrudes centrally and palate elevates symmetrically.   Chest: Clear to auscultation without wheezing, rhonchi or crackles noted.  Heart: S1+S2+0, regular and normal without murmurs, rubs or gallops noted.   Abdomen: Soft, non-tender and non-distended.  Extremities: There is no pitting edema in the distal lower extremities bilaterally.   Skin: Warm and dry without trophic changes noted.   Musculoskeletal: exam reveals no obvious joint deformities.   Neurologically:  Mental status: The patient is awake, alert and oriented in all 4 spheres. Her immediate and remote memory, attention,  language skills and fund of knowledge are appropriate. There is no evidence of aphasia, agnosia, apraxia or anomia. Speech is clear with normal prosody and enunciation. Thought process is linear. Mood is normal and affect is normal.  Cranial nerves II - XII are as described above under HEENT exam.  Motor exam: Normal bulk, strength and tone is  noted. There is no obvious resting tremor.  She has a mild bilateral upper extremity postural and action tremor, no intention tremor.  No drift.  Archimedes spiral drawing shows mild bilateral trembling.  Handwriting is tremulous, but legible, not micrographic.  Fine motor skills and coordination: Intact finger taps, hand movements and rapid alternating patting bilaterally.  Intact foot taps bilaterally.    Cerebellar testing: No dysmetria or intention tremor. There is no truncal or gait ataxia.  Normal heel-to-shin.   Sensory exam: intact to light touch in the upper and lower extremities.  Romberg negative. Gait, station and balance: She stands easily. No veering to one side is noted. No leaning to one side is noted. Posture is age-appropriate and stance is narrow based. Gait shows normal stride length and normal pace. No problems turning are noted.  Tandem walk slightly challenging but doable.  Assessment and Plan:  In summary, Kara Medina is a very pleasant 47 y.o.-year old female with an underlying medical history of anemia, multiple myeloma on chemotherapy, left ventricular hypertrophy, hypertension, sleep apnea, and obesity, who presents for evaluation of her hand tremors of several years duration, with history and physical exam supportive of essential tremor.  Findings are overall on the mild side today but she has noted significant worsening in the recent past.  We talked about tremor triggers, it is certainly possible that she has had exacerbation of her tremor since starting several chemotherapy agents.  Stress is a big trigger as well and she does  endorse quite a bit of stress increased recently what with her myeloma diagnosis and current chemotherapy, pending bone marrow transplant.  We talked about supportive measures, she is encouraged to stay well-hydrated, avoid caffeine, avoid alcohol, and how sleep disturbance may be an exacerbating factor.  She may be at risk of still having sleep apnea, she was diagnosed with OSA several years ago, she had a sleep study but never was treated with CPAP, she did not follow through with that recommendation at the time as she recalls.  She would be willing to get reevaluated and consider PAP therapy.  We talked about treatment options for sleep apnea and the risks and ramifications of untreated moderate to severe obstructive sleep apnea, in particular, with regards to developing complications down the road, especially cardiovascular and neurovascular disease.  Exam is otherwise not alarming, no evidence of parkinsonism.  She is advised that we can consider symptomatic treatment in the form of Mysoline low-dose.  She is already on a beta-blocker and I would not recommend adding another beta-blocker at this time.  She is advised to talk to you about any concerns from your end of things with starting Mysoline/primidone low-dose, I would start with 50 mg strength half a pill at bedtime and increase in 25 mg increments to up to 100 mg at bedtime if need be.  I did not provide her with a prescription quite yet, she would like to think about it and also balance it off of you.  We will plan to follow-up as needed, especially, if she has obstructive sleep apnea then we should plan a follow-up in sleep clinic accordingly.  I will order a home sleep test at this time.  We will get in touch with her once this is completed and pick up our discussion about treatment for sleep apnea at the time as well.   This was an extended visit of over 60 minutes given extended chart review and multiple concerns addressed.  Thank you  very much  for allowing me to participate in the care of this nice patient. If I can be of any further assistance to you please do not hesitate to call me at [phone].  Sincerely,   Huston Foley, MD, PhD

## 2022-12-19 NOTE — Patient Instructions (Addendum)
You have a tremor of both hands, likely essential tremor.  Your tremor may have become worse over time, also possibly exacerbated by stress and certain medications you are on currently.  I do not see any signs or symptoms of parkinson's like disease or what we call parkinsonism.   For your tremor, I would not recommend any new medications quite yet.  Please note that we often recommend a beta-blocker for tremor control and you are actually already on a beta-blocker, namely labetalol.      We may consider another medication for tremor, called primidone in the future.  Its brand-name is/was Mysoline (primidone) 50 mg strength: You would take 1/2 pill each bedtime for 2 weeks, then 1 pill each bedtime for 2 weeks, then 1 1/2 pills each bedtime for 2 weeks, then 2 pills each bedtime thereafter. Common side effects reported are: Sleepiness, drowsiness, balance problems, confusion, and GI related symptoms. You can think about it, read up on it, also balance it off your hematologist.   As discussed, you had a history of sleep apnea, I believe you may still be at risk for it.  Sleep disturbance, poor sleep, sleep deprivation are conditions that can exacerbate tremors for patients who have any form of tremor.  I recommend that we proceed with a home sleep test to reevaluate your sleep apnea and consider treatment with a CPAP or AutoPap machine.  Please remember, that any kind of tremor may be exacerbated by anxiety, anger, nervousness, excitement, dehydration, sleep deprivation, thyroid dysfunction, by caffeine, and low blood sugar values or blood sugar fluctuations. Some medications can exacerbate tremors, this includes certain asthma or COPD medications and certain antidepressants.

## 2022-12-20 MED FILL — Dexamethasone Sodium Phosphate Inj 100 MG/10ML: INTRAMUSCULAR | Qty: 2 | Status: AC

## 2022-12-23 ENCOUNTER — Encounter: Payer: Self-pay | Admitting: Adult Health

## 2022-12-23 ENCOUNTER — Other Ambulatory Visit: Payer: Self-pay

## 2022-12-23 ENCOUNTER — Inpatient Hospital Stay (HOSPITAL_BASED_OUTPATIENT_CLINIC_OR_DEPARTMENT_OTHER): Payer: 59 | Admitting: Adult Health

## 2022-12-23 ENCOUNTER — Other Ambulatory Visit: Payer: 59

## 2022-12-23 ENCOUNTER — Inpatient Hospital Stay: Payer: 59

## 2022-12-23 VITALS — BP 139/77 | HR 90 | Temp 98.6°F | Resp 17

## 2022-12-23 DIAGNOSIS — D472 Monoclonal gammopathy: Secondary | ICD-10-CM

## 2022-12-23 DIAGNOSIS — C9000 Multiple myeloma not having achieved remission: Secondary | ICD-10-CM

## 2022-12-23 DIAGNOSIS — D649 Anemia, unspecified: Secondary | ICD-10-CM

## 2022-12-23 DIAGNOSIS — Z5112 Encounter for antineoplastic immunotherapy: Secondary | ICD-10-CM | POA: Diagnosis not present

## 2022-12-23 LAB — CBC WITH DIFFERENTIAL/PLATELET
Abs Immature Granulocytes: 0.06 10*3/uL (ref 0.00–0.07)
Basophils Absolute: 0 10*3/uL (ref 0.0–0.1)
Basophils Relative: 0 %
Eosinophils Absolute: 0 10*3/uL (ref 0.0–0.5)
Eosinophils Relative: 0 %
HCT: 29.2 % — ABNORMAL LOW (ref 36.0–46.0)
Hemoglobin: 9.1 g/dL — ABNORMAL LOW (ref 12.0–15.0)
Immature Granulocytes: 1 %
Lymphocytes Relative: 10 %
Lymphs Abs: 0.8 10*3/uL (ref 0.7–4.0)
MCH: 29.7 pg (ref 26.0–34.0)
MCHC: 31.2 g/dL (ref 30.0–36.0)
MCV: 95.4 fL (ref 80.0–100.0)
Monocytes Absolute: 1.2 10*3/uL — ABNORMAL HIGH (ref 0.1–1.0)
Monocytes Relative: 15 %
Neutro Abs: 5.9 10*3/uL (ref 1.7–7.7)
Neutrophils Relative %: 74 %
Platelets: 341 10*3/uL (ref 150–400)
RBC: 3.06 MIL/uL — ABNORMAL LOW (ref 3.87–5.11)
RDW: 17.1 % — ABNORMAL HIGH (ref 11.5–15.5)
WBC: 7.9 10*3/uL (ref 4.0–10.5)
nRBC: 0 % (ref 0.0–0.2)

## 2022-12-23 LAB — COMPREHENSIVE METABOLIC PANEL
ALT: 15 U/L (ref 0–44)
AST: 13 U/L — ABNORMAL LOW (ref 15–41)
Albumin: 3.4 g/dL — ABNORMAL LOW (ref 3.5–5.0)
Alkaline Phosphatase: 46 U/L (ref 38–126)
Anion gap: 6 (ref 5–15)
BUN: 17 mg/dL (ref 6–20)
CO2: 24 mmol/L (ref 22–32)
Calcium: 8.7 mg/dL — ABNORMAL LOW (ref 8.9–10.3)
Chloride: 106 mmol/L (ref 98–111)
Creatinine, Ser: 0.64 mg/dL (ref 0.44–1.00)
GFR, Estimated: >60 mL/min (ref >60)
Glucose, Bld: 179 mg/dL — ABNORMAL HIGH (ref 70–99)
Potassium: 3.8 mmol/L (ref 3.5–5.1)
Sodium: 136 mmol/L (ref 135–145)
Total Bilirubin: 0.4 mg/dL (ref 0.3–1.2)
Total Protein: 7.8 g/dL (ref 6.5–8.1)

## 2022-12-23 MED ORDER — BORTEZOMIB CHEMO SQ INJECTION 3.5 MG (2.5MG/ML)
1.3000 mg/m2 | Freq: Once | INTRAMUSCULAR | Status: AC
  Administered 2022-12-23: 2.75 mg via SUBCUTANEOUS
  Filled 2022-12-23: qty 1.1

## 2022-12-23 MED ORDER — HEPARIN SOD (PORK) LOCK FLUSH 100 UNIT/ML IV SOLN
500.0000 [IU] | Freq: Once | INTRAVENOUS | Status: AC | PRN
  Administered 2022-12-23: 500 [IU]

## 2022-12-23 MED ORDER — PROCHLORPERAZINE MALEATE 10 MG PO TABS
10.0000 mg | ORAL_TABLET | Freq: Once | ORAL | Status: AC
  Administered 2022-12-23: 10 mg via ORAL
  Filled 2022-12-23: qty 1

## 2022-12-23 MED ORDER — SODIUM CHLORIDE 0.9% FLUSH
10.0000 mL | INTRAVENOUS | Status: DC | PRN
  Administered 2022-12-23: 10 mL

## 2022-12-23 MED ORDER — SODIUM CHLORIDE 0.9 % IV SOLN
16.0000 mg/kg | Freq: Once | INTRAVENOUS | Status: AC
  Administered 2022-12-23: 1600 mg via INTRAVENOUS
  Filled 2022-12-23: qty 80

## 2022-12-23 MED ORDER — SODIUM CHLORIDE 0.9% FLUSH
10.0000 mL | Freq: Once | INTRAVENOUS | Status: AC | PRN
  Administered 2022-12-23: 10 mL

## 2022-12-23 MED ORDER — SODIUM CHLORIDE 0.9 % IV SOLN: Freq: Once | INTRAVENOUS | Status: AC

## 2022-12-23 MED ORDER — SODIUM CHLORIDE 0.9 % IV SOLN
20.0000 mg | Freq: Once | INTRAVENOUS | Status: DC
  Filled 2022-12-23: qty 2

## 2022-12-23 MED ORDER — ACETAMINOPHEN 325 MG PO TABS
650.0000 mg | ORAL_TABLET | Freq: Once | ORAL | Status: AC
  Administered 2022-12-23: 650 mg via ORAL
  Filled 2022-12-23: qty 2

## 2022-12-23 MED ORDER — SODIUM CHLORIDE 0.9 % IV SOLN
20.0000 mg | Freq: Once | INTRAVENOUS | Status: AC
  Administered 2022-12-23: 20 mg via INTRAVENOUS
  Filled 2022-12-23: qty 20

## 2022-12-23 MED ORDER — DIPHENHYDRAMINE HCL 25 MG PO CAPS
50.0000 mg | ORAL_CAPSULE | Freq: Once | ORAL | Status: AC
  Administered 2022-12-23: 50 mg via ORAL
  Filled 2022-12-23: qty 2

## 2022-12-23 NOTE — Patient Instructions (Signed)
[facility]  Discharge Instructions: Thank you for choosing [facility] to provide your oncology and hematology care.   If you have a lab appointment with the Cancer Center, please go directly to the Cancer Center and check in at the registration area.   Wear comfortable clothing and clothing appropriate for easy access to any Portacath or PICC line.   We strive to give you quality time with your provider. You may need to reschedule your appointment if you arrive late (15 or more minutes).  Arriving late affects you and other patients whose appointments are after yours.  Also, if you miss three or more appointments without notifying the office, you may be dismissed from the clinic at the provider's discretion.      For prescription refill requests, have your pharmacy contact our office and allow 72 hours for refills to be completed.    Today you received the following chemotherapy and/or immunotherapy agents darzalex faspro, velcade      To help prevent nausea and vomiting after your treatment, we encourage you to take your nausea medication as directed.  BELOW ARE SYMPTOMS THAT SHOULD BE REPORTED IMMEDIATELY: *FEVER GREATER THAN 100.4 F (38 C) OR HIGHER *CHILLS OR SWEATING *NAUSEA AND VOMITING THAT IS NOT CONTROLLED WITH YOUR NAUSEA MEDICATION *UNUSUAL SHORTNESS OF BREATH *UNUSUAL BRUISING OR BLEEDING *URINARY PROBLEMS (pain or burning when urinating, or frequent urination) *BOWEL PROBLEMS (unusual diarrhea, constipation, pain near the anus) TENDERNESS IN MOUTH AND THROAT WITH OR WITHOUT PRESENCE OF ULCERS (sore throat, sores in mouth, or a toothache) UNUSUAL RASH, SWELLING OR PAIN  UNUSUAL VAGINAL DISCHARGE OR ITCHING   Items with * indicate a potential emergency and should be followed up as soon as possible or go to the Emergency Department if any problems should occur.  Please show the CHEMOTHERAPY ALERT CARD or IMMUNOTHERAPY ALERT  CARD at check-in to the Emergency Department and triage nurse.  Should you have questions after your visit or need to cancel or reschedule your appointment, please contact [facility]  Dept: [phone]  and follow the prompts.  Office hours are 8:00 a.m. to 4:30 p.m. Monday - Friday. Please note that voicemails left after 4:00 p.m. may not be returned until the following business day.  We are closed weekends and major holidays. You have access to a nurse at all times for urgent questions. Please call the main number to the clinic Dept: [phone] and follow the prompts.   For any non-urgent questions, you may also contact your provider using MyChart. We now offer e-Visits for anyone 18 and older to request care online for non-urgent symptoms. For details visit [email or web address].   Also download the MyChart app! Go to the app store, search "MyChart", open the app, select , and log in with your MyChart username and password.   

## 2022-12-23 NOTE — Assessment & Plan Note (Signed)
Kara Medina is a 47 year old woman with multiple myeloma here today for follow-up and evaluation prior to receiving daratumumab, Velcade, Revlimid, dexamethasone, Zometa.  Multiple myeloma: She is tolerating treatment well.  She will proceed with therapy today.   Hypocalcemia: She continues on calcium supplementation.  CMET pending at time of appointment completion Hypokalemia: Recommended to continue potassium intake.  This lab is pending today Fatigue: recommended continued energy conservation as she is doing.  [name] will return to clinic weekly for labs, follow-up, and treatment.

## 2022-12-23 NOTE — Progress Notes (Signed)
[facility] Cancer Follow up:    Kara Saint, MD [address] [place] [address]   DIAGNOSIS:  Cancer Staging  No matching staging information was found for the patient.  SUMMARY OF ONCOLOGIC HISTORY: Oncology History  Multiple myeloma (HCC)  09/12/2022 Initial Diagnosis   Multiple myeloma (HCC)   09/20/2022 - 10/04/2022 Chemotherapy   Patient is on Treatment Plan : MYELOMA NEWLY DIAGNOSED TRANSPLANT CANDIDATE DaraVRd (Daratumumab IV) q21d x 6 Cycles (Induction/Consolidation)     10/11/2022 - 10/22/2022 Chemotherapy   Patient is on Treatment Plan : MYELOMA NON-TRANSPLANT CANDIDATES VRd SQ q21d     11/04/2022 -  Chemotherapy   Patient is on Treatment Plan : MYELOMA NEWLY DIAGNOSED TRANSPLANT CANDIDATE DaraVRd (Daratumumab IV) q21d x 6 Cycles (Induction/Consolidation)       CURRENT THERAPY: DaraVRd  INTERVAL HISTORY: Kara Medina 47 y.o. female returns for follow-up prior to receiving daratumumab and Velcade.  She is tolerating this with no difficulties.  She denies any nausea vomiting, rash, peripheral neuropathy, or diarrhea.  She restarted Revlimid 15mg  daily on 12/21/2022.   Patient Active Problem List   Diagnosis Date Noted   HTN (hypertension) 11/14/2022   Multiple myeloma (HCC) 09/12/2022   MGUS (monoclonal gammopathy of unknown significance) 09/10/2022   Hypertensive urgency 08/29/2022   Iron deficiency anemia 08/29/2022   LVH (left ventricular hypertrophy) 08/29/2022   Normocytic anemia 08/29/2022    is allergic to amoxicillin.  MEDICAL HISTORY: Past Medical History:  Diagnosis Date   Anemia    Gestational diabetes    diet controlled   Postpartum care following vaginal delivery (7/27) 02/09/2016   Pregnancy induced hypertension    Trigeminal neuralgia    2012 corrected with surgery    SURGICAL HISTORY: Past Surgical History:  Procedure Laterality Date   APPENDECTOMY  2008   IR IMAGING GUIDED PORT INSERTION  12/03/2022    TRIGEMINAL NERVE DECOMPRESSION  2012    SOCIAL HISTORY: Social History   Socioeconomic History   Marital status: Married    Spouse name: Not on file   Number of children: Not on file   Years of education: Not on file   Highest education level: Bachelor's degree (e.g., BA, AB, BS)  Occupational History   Not on file  Tobacco Use   Smoking status: Never   Smokeless tobacco: Never  Vaping Use   Vaping Use: Never used  Substance and Sexual Activity   Alcohol use: No   Drug use: No   Sexual activity: Yes  Other Topics Concern   Not on file  Social History Narrative   Not on file   Social Determinants of Health   Financial Resource Strain: Low Risk  (08/23/2022)   Overall Financial Resource Strain (CARDIA)    Difficulty of Paying Living Expenses: Not hard at all  Food Insecurity: No Food Insecurity (08/23/2022)   Hunger Vital Sign    Worried About Running Out of Food in the Last Year: Never true    Ran Out of Food in the Last Year: Never true  Transportation Needs: No Transportation Needs (08/23/2022)   PRAPARE - Administrator, Civil Service (Medical): No    Lack of Transportation (Non-Medical): No  Physical Activity: Insufficiently Active (08/23/2022)   Exercise Vital Sign    Days of Exercise per Week: 2 days    Minutes of Exercise per Session: 30 min  Stress: Stress Concern Present (08/23/2022)   [employer] of Occupational Health - Occupational Stress  Questionnaire    Feeling of Stress : To some extent  Social Connections: Unknown (08/23/2022)   Social Connection and Isolation Panel [NHANES]    Frequency of Communication with Friends and Family: More than three times a week    Frequency of Social Gatherings with Friends and Family: Once a week    Attends Religious Services: Patient declined    Database administrator or Organizations: Yes    Attends Engineer, structural: More than 4 times per year    Marital Status: Married  Catering manager  Violence: Not on file    FAMILY HISTORY: Family History  Problem Relation Age of Onset   Cancer Other    Hypertension Other    Healthy Father    Cancer Paternal Grandfather        stomach, lung   Hypertension Mother    Breast cancer Mother 32   Hypertension Maternal Grandmother    Arthritis Maternal Grandmother    Hypertension Maternal Grandfather    Other Brother        ?prediabetic   Diabetes Paternal Grandmother    Hypertension Paternal Grandmother    Autism Son     Review of Systems  Constitutional:  Positive for fatigue. Negative for appetite change, chills, fever and unexpected weight change.  HENT:   Negative for hearing loss, lump/mass and trouble swallowing.   Eyes:  Negative for eye problems and icterus.  Respiratory:  Negative for chest tightness, cough and shortness of breath.   Cardiovascular:  Negative for chest pain, leg swelling and palpitations.  Gastrointestinal:  Negative for abdominal distention, abdominal pain, constipation, diarrhea, nausea and vomiting.  Endocrine: Negative for hot flashes.  Genitourinary:  Negative for difficulty urinating.   Musculoskeletal:  Negative for arthralgias.  Skin:  Negative for itching and rash.  Neurological:  Negative for dizziness, extremity weakness, headaches and numbness.  Hematological:  Negative for adenopathy. Does not bruise/bleed easily.  Psychiatric/Behavioral:  Negative for depression. The patient is not nervous/anxious.       PHYSICAL EXAMINATION   Onc Performance Status - 12/23/22 0900       ECOG Perf Status   ECOG Perf Status Fully active, able to carry on all pre-disease performance without restriction      KPS SCALE   KPS % SCORE Normal, no compliants, no evidence of disease             Vitals:   12/23/22 0931  BP: (!) 140/72  Pulse: (!) 102  Resp: 18  Temp: 97.9 F (36.6 C)  SpO2: 98%    Physical Exam Constitutional:      General: She is not in acute distress.    Appearance:  Normal appearance. She is not toxic-appearing.  HENT:     Head: Normocephalic and atraumatic.     Mouth/Throat:     Mouth: Mucous membranes are moist.     Pharynx: Oropharynx is clear. No oropharyngeal exudate or posterior oropharyngeal erythema.  Eyes:     General: No scleral icterus. Cardiovascular:     Rate and Rhythm: Normal rate and regular rhythm.     Pulses: Normal pulses.     Heart sounds: Normal heart sounds.  Pulmonary:     Effort: Pulmonary effort is normal.     Breath sounds: Normal breath sounds.  Abdominal:     General: Abdomen is flat. Bowel sounds are normal. There is no distension.     Palpations: Abdomen is soft.     Tenderness: There is no  abdominal tenderness.  Musculoskeletal:        General: No swelling.     Cervical back: Neck supple.  Lymphadenopathy:     Cervical: No cervical adenopathy.  Skin:    General: Skin is warm and dry.     Findings: No rash.  Neurological:     General: No focal deficit present.     Mental Status: She is alert.  Psychiatric:        Mood and Affect: Mood normal.        Behavior: Behavior normal.     LABORATORY DATA:  CBC    Component Value Date/Time   WBC 7.9 12/23/2022 0905   RBC 3.06 (L) 12/23/2022 0905   HGB 9.1 (L) 12/23/2022 0905   HGB 8.2 (L) 11/25/2022 0851   HCT 29.2 (L) 12/23/2022 0905   HCT 24.4 (L) 09/04/2022 1037   PLT 341 12/23/2022 0905   PLT 393 11/25/2022 0851   MCV 95.4 12/23/2022 0905   MCH 29.7 12/23/2022 0905   MCHC 31.2 12/23/2022 0905   RDW 17.1 (H) 12/23/2022 0905   LYMPHSABS 0.8 12/23/2022 0905   MONOABS 1.2 (H) 12/23/2022 0905   EOSABS 0.0 12/23/2022 0905   BASOSABS 0.0 12/23/2022 0905    CMP     Component Value Date/Time   NA 136 12/23/2022 0905   K 3.8 12/23/2022 0905   CL 106 12/23/2022 0905   CO2 24 12/23/2022 0905   GLUCOSE 179 (H) 12/23/2022 0905   BUN 17 12/23/2022 0905   CREATININE 0.64 12/23/2022 0905   CREATININE 0.80 12/16/2022 0851   CREATININE 0.95 08/23/2022  1631   CALCIUM 8.7 (L) 12/23/2022 0905   PROT 7.8 12/23/2022 0905   ALBUMIN 3.4 (L) 12/23/2022 0905   AST 13 (L) 12/23/2022 0905   AST 13 (L) 12/16/2022 0851   ALT 15 12/23/2022 0905   ALT 21 12/16/2022 0851   ALKPHOS 46 12/23/2022 0905   BILITOT 0.4 12/23/2022 0905   BILITOT 0.4 12/16/2022 0851   GFRNONAA >60 12/23/2022 0905   GFRNONAA >60 12/16/2022 0851   GFRAA >60 02/09/2016 0525      ASSESSMENT and THERAPY PLAN:   Multiple myeloma (HCC) Kara Medina is a 47 year old woman with multiple myeloma here today for follow-up and evaluation prior to receiving daratumumab, Velcade, Revlimid, dexamethasone, Zometa.  Multiple myeloma: She is tolerating treatment well.  She will proceed with therapy today.   Hypocalcemia: She continues on calcium supplementation.  CMET pending at time of appointment completion Hypokalemia: Recommended to continue potassium intake.  This lab is pending today Fatigue: recommended continued energy conservation as she is doing.  Kara Medina will return to clinic weekly for labs, follow-up, and treatment.      All questions were answered. The patient knows to call the clinic with any problems, questions or concerns. We can certainly see the patient much sooner if necessary.  Total encounter time:20 minutes*in face-to-face visit time, chart review, lab review, care coordination, order entry, and documentation of the encounter time.    Lillard Anes, NP 12/23/22 10:05 AM Medical Oncology and Hematology [facility] [address] [place], [place] [address] Tel. [phone]    Fax. [phone]  *Total Encounter Time as defined by the Centers for Medicare and Medicaid Services includes, in addition to the face-to-face time of a patient visit (documented in the note above) non-face-to-face time: obtaining and reviewing outside history, ordering and reviewing medications, tests or procedures, care coordination (communications with other health care  professionals or caregivers)  and documentation in the medical record.

## 2022-12-26 ENCOUNTER — Inpatient Hospital Stay: Payer: 59

## 2022-12-26 VITALS — BP 137/86 | HR 90 | Temp 98.8°F | Resp 18

## 2022-12-26 DIAGNOSIS — Z5112 Encounter for antineoplastic immunotherapy: Secondary | ICD-10-CM | POA: Diagnosis not present

## 2022-12-26 DIAGNOSIS — C9000 Multiple myeloma not having achieved remission: Secondary | ICD-10-CM

## 2022-12-26 DIAGNOSIS — D472 Monoclonal gammopathy: Secondary | ICD-10-CM

## 2022-12-26 MED ORDER — BORTEZOMIB CHEMO SQ INJECTION 3.5 MG (2.5MG/ML)
1.3000 mg/m2 | Freq: Once | INTRAMUSCULAR | Status: AC
  Administered 2022-12-26: 2.75 mg via SUBCUTANEOUS
  Filled 2022-12-26: qty 1.1

## 2022-12-26 MED ORDER — PROCHLORPERAZINE MALEATE 10 MG PO TABS
10.0000 mg | ORAL_TABLET | Freq: Once | ORAL | Status: AC
  Administered 2022-12-26: 10 mg via ORAL
  Filled 2022-12-26: qty 1

## 2022-12-27 MED FILL — Dexamethasone Sodium Phosphate Inj 100 MG/10ML: INTRAMUSCULAR | Qty: 2 | Status: AC

## 2022-12-30 ENCOUNTER — Other Ambulatory Visit: Payer: Self-pay

## 2022-12-30 ENCOUNTER — Inpatient Hospital Stay: Payer: 59

## 2022-12-30 ENCOUNTER — Other Ambulatory Visit: Payer: 59

## 2022-12-30 ENCOUNTER — Inpatient Hospital Stay (HOSPITAL_BASED_OUTPATIENT_CLINIC_OR_DEPARTMENT_OTHER): Payer: 59 | Admitting: Hematology and Oncology

## 2022-12-30 VITALS — BP 132/68 | HR 78 | Temp 98.2°F | Resp 18

## 2022-12-30 DIAGNOSIS — C9000 Multiple myeloma not having achieved remission: Secondary | ICD-10-CM | POA: Diagnosis not present

## 2022-12-30 DIAGNOSIS — D472 Monoclonal gammopathy: Secondary | ICD-10-CM | POA: Diagnosis not present

## 2022-12-30 DIAGNOSIS — Z5112 Encounter for antineoplastic immunotherapy: Secondary | ICD-10-CM | POA: Diagnosis not present

## 2022-12-30 LAB — CMP (CANCER CENTER ONLY)
ALT: 13 U/L (ref 0–44)
AST: 14 U/L — ABNORMAL LOW (ref 15–41)
Albumin: 3.1 g/dL — ABNORMAL LOW (ref 3.5–5.0)
Alkaline Phosphatase: 46 U/L (ref 38–126)
Anion gap: 3 — ABNORMAL LOW (ref 5–15)
BUN: 18 mg/dL (ref 6–20)
CO2: 30 mmol/L (ref 22–32)
Calcium: 8.2 mg/dL — ABNORMAL LOW (ref 8.9–10.3)
Chloride: 103 mmol/L (ref 98–111)
Creatinine: 0.66 mg/dL (ref 0.44–1.00)
GFR, Estimated: >60 mL/min (ref >60)
Glucose, Bld: 91 mg/dL (ref 70–99)
Potassium: 3.5 mmol/L (ref 3.5–5.1)
Sodium: 136 mmol/L (ref 135–145)
Total Bilirubin: 0.4 mg/dL (ref 0.3–1.2)
Total Protein: 6.7 g/dL (ref 6.5–8.1)

## 2022-12-30 LAB — CBC WITH DIFFERENTIAL (CANCER CENTER ONLY)
Abs Immature Granulocytes: 0.1 10*3/uL — ABNORMAL HIGH (ref 0.00–0.07)
Basophils Absolute: 0 10*3/uL (ref 0.0–0.1)
Basophils Relative: 0 %
Eosinophils Absolute: 0.1 10*3/uL (ref 0.0–0.5)
Eosinophils Relative: 1 %
HCT: 30.1 % — ABNORMAL LOW (ref 36.0–46.0)
Hemoglobin: 9.5 g/dL — ABNORMAL LOW (ref 12.0–15.0)
Immature Granulocytes: 1 %
Lymphocytes Relative: 26 %
Lymphs Abs: 2.8 10*3/uL (ref 0.7–4.0)
MCH: 30 pg (ref 26.0–34.0)
MCHC: 31.6 g/dL (ref 30.0–36.0)
MCV: 95 fL (ref 80.0–100.0)
Monocytes Absolute: 2.4 10*3/uL — ABNORMAL HIGH (ref 0.1–1.0)
Monocytes Relative: 23 %
Neutro Abs: 5.3 10*3/uL (ref 1.7–7.7)
Neutrophils Relative %: 49 %
Platelet Count: 237 10*3/uL (ref 150–400)
RBC: 3.17 MIL/uL — ABNORMAL LOW (ref 3.87–5.11)
RDW: 16.5 % — ABNORMAL HIGH (ref 11.5–15.5)
WBC Count: 10.7 10*3/uL — ABNORMAL HIGH (ref 4.0–10.5)
nRBC: 0 % (ref 0.0–0.2)

## 2022-12-30 MED ORDER — ACETAMINOPHEN 325 MG PO TABS
650.0000 mg | ORAL_TABLET | Freq: Once | ORAL | Status: AC
  Administered 2022-12-30: 650 mg via ORAL
  Filled 2022-12-30: qty 2

## 2022-12-30 MED ORDER — DIPHENHYDRAMINE HCL 25 MG PO CAPS
50.0000 mg | ORAL_CAPSULE | Freq: Once | ORAL | Status: AC
  Administered 2022-12-30: 50 mg via ORAL
  Filled 2022-12-30: qty 2

## 2022-12-30 MED ORDER — BORTEZOMIB CHEMO SQ INJECTION 3.5 MG (2.5MG/ML)
1.3000 mg/m2 | Freq: Once | INTRAMUSCULAR | Status: AC
  Administered 2022-12-30: 2.75 mg via SUBCUTANEOUS
  Filled 2022-12-30: qty 1.1

## 2022-12-30 MED ORDER — SODIUM CHLORIDE 0.9 % IV SOLN
16.0000 mg/kg | Freq: Once | INTRAVENOUS | Status: AC
  Administered 2022-12-30: 1600 mg via INTRAVENOUS
  Filled 2022-12-30: qty 80

## 2022-12-30 MED ORDER — SODIUM CHLORIDE 0.9 % IV SOLN
20.0000 mg | Freq: Once | INTRAVENOUS | Status: AC
  Administered 2022-12-30: 20 mg via INTRAVENOUS
  Filled 2022-12-30: qty 20

## 2022-12-30 MED ORDER — SODIUM CHLORIDE 0.9 % IV SOLN: Freq: Once | INTRAVENOUS | Status: AC

## 2022-12-30 MED ORDER — PROCHLORPERAZINE MALEATE 10 MG PO TABS
10.0000 mg | ORAL_TABLET | Freq: Four times a day (QID) | ORAL | Status: DC | PRN
  Administered 2022-12-30: 10 mg via ORAL
  Filled 2022-12-30: qty 1

## 2022-12-30 MED ORDER — SODIUM CHLORIDE 0.9% FLUSH
10.0000 mL | Freq: Once | INTRAVENOUS | Status: AC | PRN
  Administered 2022-12-30: 10 mL

## 2022-12-30 NOTE — Progress Notes (Signed)
[facility] Cancer Follow up:    Kara Saint, MD [address] [place] [address]   DIAGNOSIS:  Cancer Staging  No matching staging information was found for the patient.  SUMMARY OF ONCOLOGIC HISTORY: Oncology History  Multiple myeloma (HCC)  09/12/2022 Initial Diagnosis   Multiple myeloma (HCC)   09/20/2022 - 10/04/2022 Chemotherapy   Patient is on Treatment Plan : MYELOMA NEWLY DIAGNOSED TRANSPLANT CANDIDATE DaraVRd (Daratumumab IV) q21d x 6 Cycles (Induction/Consolidation)     10/11/2022 - 10/22/2022 Chemotherapy   Patient is on Treatment Plan : MYELOMA NON-TRANSPLANT CANDIDATES VRd SQ q21d     11/04/2022 -  Chemotherapy   Patient is on Treatment Plan : MYELOMA NEWLY DIAGNOSED TRANSPLANT CANDIDATE DaraVRd (Daratumumab IV) q21d x 6 Cycles (Induction/Consolidation)       CURRENT THERAPY: DaraVRd  INTERVAL HISTORY: Kara Medina 47 y.o. female returns for follow-up prior to receiving daratumumab and Velcade.   She is tolerating this with no difficulties.  She denies any complaints today.  She is finally down with moving and has getting over the stress.  She is taking all her medications as prescribed.  Rest of the pertinent 10 point ROS reviewed and negative   Patient Active Problem List   Diagnosis Date Noted   HTN (hypertension) 11/14/2022   Multiple myeloma (HCC) 09/12/2022   MGUS (monoclonal gammopathy of unknown significance) 09/10/2022   Hypertensive urgency 08/29/2022   Iron deficiency anemia 08/29/2022   LVH (left ventricular hypertrophy) 08/29/2022   Normocytic anemia 08/29/2022    is allergic to amoxicillin.  MEDICAL HISTORY: Past Medical History:  Diagnosis Date   Anemia    Gestational diabetes    diet controlled   Postpartum care following vaginal delivery (7/27) 02/09/2016   Pregnancy induced hypertension    Trigeminal neuralgia    2012 corrected with surgery    SURGICAL HISTORY: Past Surgical History:  Procedure  Laterality Date   APPENDECTOMY  2008   IR IMAGING GUIDED PORT INSERTION  12/03/2022   TRIGEMINAL NERVE DECOMPRESSION  2012    SOCIAL HISTORY: Social History   Socioeconomic History   Marital status: Married    Spouse name: Not on file   Number of children: Not on file   Years of education: Not on file   Highest education level: Bachelor's degree (e.g., BA, AB, BS)  Occupational History   Not on file  Tobacco Use   Smoking status: Never   Smokeless tobacco: Never  Vaping Use   Vaping Use: Never used  Substance and Sexual Activity   Alcohol use: No   Drug use: No   Sexual activity: Yes  Other Topics Concern   Not on file  Social History Narrative   Not on file   Social Determinants of Health   Financial Resource Strain: Low Risk  (08/23/2022)   Overall Financial Resource Strain (CARDIA)    Difficulty of Paying Living Expenses: Not hard at all  Food Insecurity: No Food Insecurity (08/23/2022)   Hunger Vital Sign    Worried About Running Out of Food in the Last Year: Never true    Ran Out of Food in the Last Year: Never true  Transportation Needs: No Transportation Needs (08/23/2022)   PRAPARE - Administrator, Civil Service (Medical): No    Lack of Transportation (Non-Medical): No  Physical Activity: Insufficiently Active (08/23/2022)   Exercise Vital Sign    Days of Exercise per Week: 2 days    Minutes of  Exercise per Session: 30 min  Stress: Stress Concern Present (08/23/2022)   [employer] of Occupational Health - Occupational Stress Questionnaire    Feeling of Stress : To some extent  Social Connections: Unknown (08/23/2022)   Social Connection and Isolation Panel [NHANES]    Frequency of Communication with Friends and Family: More than three times a week    Frequency of Social Gatherings with Friends and Family: Once a week    Attends Religious Services: Patient declined    Database administrator or Organizations: Yes    Attends Hospital doctor: More than 4 times per year    Marital Status: Married  Catering manager Violence: Not on file    FAMILY HISTORY: Family History  Problem Relation Age of Onset   Cancer Other    Hypertension Other    Healthy Father    Cancer Paternal Grandfather        stomach, lung   Hypertension Mother    Breast cancer Mother 56   Hypertension Maternal Grandmother    Arthritis Maternal Grandmother    Hypertension Maternal Grandfather    Other Brother        ?prediabetic   Diabetes Paternal Grandmother    Hypertension Paternal Grandmother    Autism Son     Review of Systems  Constitutional:  Positive for fatigue. Negative for appetite change, chills, fever and unexpected weight change.  HENT:   Negative for hearing loss, lump/mass and trouble swallowing.   Eyes:  Negative for eye problems and icterus.  Respiratory:  Negative for chest tightness, cough and shortness of breath.   Cardiovascular:  Negative for chest pain, leg swelling and palpitations.  Gastrointestinal:  Negative for abdominal distention, abdominal pain, constipation, diarrhea, nausea and vomiting.  Endocrine: Negative for hot flashes.  Genitourinary:  Negative for difficulty urinating.   Musculoskeletal:  Negative for arthralgias.  Skin:  Negative for itching and rash.  Neurological:  Negative for dizziness, extremity weakness, headaches and numbness.  Hematological:  Negative for adenopathy. Does not bruise/bleed easily.  Psychiatric/Behavioral:  Negative for depression. The patient is not nervous/anxious.       PHYSICAL EXAMINATION     Vitals:   12/30/22 1007  BP: 136/84  Pulse: 94  Resp: 17  Temp: 97.9 F (36.6 C)  SpO2: 99%    Physical Exam Constitutional:      General: She is not in acute distress.    Appearance: Normal appearance. She is not toxic-appearing.  HENT:     Head: Normocephalic and atraumatic.     Mouth/Throat:     Mouth: Mucous membranes are moist.     Pharynx: Oropharynx is  clear. No oropharyngeal exudate or posterior oropharyngeal erythema.  Eyes:     General: No scleral icterus. Cardiovascular:     Rate and Rhythm: Normal rate and regular rhythm.     Pulses: Normal pulses.     Heart sounds: Normal heart sounds.  Pulmonary:     Effort: Pulmonary effort is normal.     Breath sounds: Normal breath sounds.  Abdominal:     General: Abdomen is flat. Bowel sounds are normal. There is no distension.     Palpations: Abdomen is soft.     Tenderness: There is no abdominal tenderness.  Musculoskeletal:        General: No swelling.     Cervical back: Neck supple.  Lymphadenopathy:     Cervical: No cervical adenopathy.  Skin:    General: Skin is warm  and dry.     Findings: No rash.  Neurological:     General: No focal deficit present.     Mental Status: She is alert.  Psychiatric:        Mood and Affect: Mood normal.        Behavior: Behavior normal.     LABORATORY DATA:  CBC    Component Value Date/Time   WBC 10.7 (H) 12/30/2022 0947   WBC 7.9 12/23/2022 0905   RBC 3.17 (L) 12/30/2022 0947   HGB 9.5 (L) 12/30/2022 0947   HCT 30.1 (L) 12/30/2022 0947   HCT 24.4 (L) 09/04/2022 1037   PLT 237 12/30/2022 0947   MCV 95.0 12/30/2022 0947   MCH 30.0 12/30/2022 0947   MCHC 31.6 12/30/2022 0947   RDW 16.5 (H) 12/30/2022 0947   LYMPHSABS 2.8 12/30/2022 0947   MONOABS 2.4 (H) 12/30/2022 0947   EOSABS 0.1 12/30/2022 0947   BASOSABS 0.0 12/30/2022 0947    CMP     Component Value Date/Time   NA 136 12/30/2022 0947   K 3.5 12/30/2022 0947   CL 103 12/30/2022 0947   CO2 30 12/30/2022 0947   GLUCOSE 91 12/30/2022 0947   BUN 18 12/30/2022 0947   CREATININE 0.66 12/30/2022 0947   CREATININE 0.95 08/23/2022 1631   CALCIUM 8.2 (L) 12/30/2022 0947   PROT 6.7 12/30/2022 0947   ALBUMIN 3.1 (L) 12/30/2022 0947   AST 14 (L) 12/30/2022 0947   ALT 13 12/30/2022 0947   ALKPHOS 46 12/30/2022 0947   BILITOT 0.4 12/30/2022 0947   GFRNONAA >60 12/30/2022 0947    GFRAA >60 02/09/2016 0525      ASSESSMENT and THERAPY PLAN:   ASSESSMENT & PLAN:    This is a very pleasant 47 year old female patient with a past medical history significant for gestational diabetes and trigeminal neuralgia corrected with surgery, hypertension referred to hematology for evaluation and recommendations regarding severe normocytic normochromic anemia.   We have reviewed her labs which showed severe anemia and elevated total protein.  SPEP showed 8.4 g/dL, IgG kappa.  Kappa lambda ratio at 110.89 No evidence of endorgan damage so far.   BMB confirmed Ig G kappa MM, bone survey pending scheduled for Wednesday. FISH for multiple myeloma showed a gain of chromosome 11, characterized as standard risk.  Cytogenetics showed normal female karyotype RISS-II, median survival 42 months.  We started her on daratumumab with Velcade and dexamethasone prior to her FISH results being available. She started cycle 1 day 1 of Darzalex/Velcade with dexamethasone on 09/20/2022.    From second cycle, we transitioned to VRD since she is a standard risk. She then was seen in [facility], they recommended that we transition back to dara VRD.   She is now here for follow up before planned cycle 3 Day 8. She is responding extremely well so far. Only adverse effect is worsening tremor likely from dex, she has seen the neurologist, she does not want to take any more medication for now.  She just wants to finish her treatment and continue to monitor the essential tremor.  No other concerning review of systems or physical examination findings.  CBC from today reviewed, stable counts and anemia.  Rest of the labs are pending at the time of my visit.  No new dental problems, continue zometa as scheduled.    All questions were answered. The patient knows to call the clinic with any problems, questions or concerns. We can certainly see the patient much sooner  if necessary.  Total encounter time:20 minutes*in  face-to-face visit time, chart review, lab review, care coordination, order entry, and documentation of the encounter time.   *Total Encounter Time as defined by the Centers for Medicare and Medicaid Services includes, in addition to the face-to-face time of a patient visit (documented in the note above) non-face-to-face time: obtaining and reviewing outside history, ordering and reviewing medications, tests or procedures, care coordination (communications with other health care professionals or caregivers) and documentation in the medical record.

## 2022-12-31 LAB — KAPPA/LAMBDA LIGHT CHAINS
Kappa free light chain: 95.5 mg/L — ABNORMAL HIGH (ref 3.3–19.4)
Kappa, lambda light chain ratio: 10.16 — ABNORMAL HIGH (ref 0.26–1.65)
Lambda free light chains: 9.4 mg/L (ref 5.7–26.3)

## 2022-12-31 LAB — IGG, IGA, IGM
IgA: 74 mg/dL — ABNORMAL LOW (ref 87–352)
IgG (Immunoglobin G), Serum: 2568 mg/dL — ABNORMAL HIGH (ref 586–1602)
IgM (Immunoglobulin M), Srm: 34 mg/dL (ref 26–217)

## 2022-12-31 NOTE — Progress Notes (Signed)
Returned call to [place], Charity fundraiser with [facility]. Joni Reining inquired as to latest multiple myeloma panel, latest cycle date, and date for scheduled cycle 4. Information relayed, Joni Reining verbalized understanding and had no further questions.

## 2023-01-02 ENCOUNTER — Other Ambulatory Visit: Payer: Self-pay | Admitting: *Deleted

## 2023-01-02 ENCOUNTER — Other Ambulatory Visit: Payer: Self-pay

## 2023-01-02 ENCOUNTER — Encounter: Payer: Self-pay | Admitting: Hematology and Oncology

## 2023-01-02 ENCOUNTER — Inpatient Hospital Stay: Payer: 59

## 2023-01-02 VITALS — BP 129/73 | HR 98 | Temp 98.4°F | Resp 18

## 2023-01-02 DIAGNOSIS — C9000 Multiple myeloma not having achieved remission: Secondary | ICD-10-CM

## 2023-01-02 DIAGNOSIS — Z5112 Encounter for antineoplastic immunotherapy: Secondary | ICD-10-CM | POA: Diagnosis not present

## 2023-01-02 DIAGNOSIS — D472 Monoclonal gammopathy: Secondary | ICD-10-CM

## 2023-01-02 MED ORDER — PROCHLORPERAZINE MALEATE 10 MG PO TABS
10.0000 mg | ORAL_TABLET | Freq: Once | ORAL | Status: AC
  Administered 2023-01-02: 10 mg via ORAL
  Filled 2023-01-02: qty 1

## 2023-01-02 MED ORDER — BORTEZOMIB CHEMO SQ INJECTION 3.5 MG (2.5MG/ML)
1.3000 mg/m2 | Freq: Once | INTRAMUSCULAR | Status: AC
  Administered 2023-01-02: 2.75 mg via SUBCUTANEOUS
  Filled 2023-01-02: qty 1.1

## 2023-01-02 NOTE — Patient Instructions (Signed)
[facility]   Discharge Instructions: Thank you for choosing [facility] to provide your oncology and hematology care.   If you have a lab appointment with the Cancer Center, please go directly to the Cancer Center and check in at the registration area.   Wear comfortable clothing and clothing appropriate for easy access to any Portacath or PICC line.   We strive to give you quality time with your provider. You may need to reschedule your appointment if you arrive late (15 or more minutes).  Arriving late affects you and other patients whose appointments are after yours.  Also, if you miss three or more appointments without notifying the office, you may be dismissed from the clinic at the provider's discretion.      For prescription refill requests, have your pharmacy contact our office and allow 72 hours for refills to be completed.    Today you received the following chemotherapy and/or immunotherapy agents: Bortezomib (Velcade)      To help prevent nausea and vomiting after your treatment, we encourage you to take your nausea medication as directed.  BELOW ARE SYMPTOMS THAT SHOULD BE REPORTED IMMEDIATELY: *FEVER GREATER THAN 100.4 F (38 C) OR HIGHER *CHILLS OR SWEATING *NAUSEA AND VOMITING THAT IS NOT CONTROLLED WITH YOUR NAUSEA MEDICATION *UNUSUAL SHORTNESS OF BREATH *UNUSUAL BRUISING OR BLEEDING *URINARY PROBLEMS (pain or burning when urinating, or frequent urination) *BOWEL PROBLEMS (unusual diarrhea, constipation, pain near the anus) TENDERNESS IN MOUTH AND THROAT WITH OR WITHOUT PRESENCE OF ULCERS (sore throat, sores in mouth, or a toothache) UNUSUAL RASH, SWELLING OR PAIN  UNUSUAL VAGINAL DISCHARGE OR ITCHING   Items with * indicate a potential emergency and should be followed up as soon as possible or go to the Emergency Department if any problems should occur.  Please show the CHEMOTHERAPY ALERT CARD or IMMUNOTHERAPY ALERT  CARD at check-in to the Emergency Department and triage nurse.  Should you have questions after your visit or need to cancel or reschedule your appointment, please contact [facility]  Dept: [phone]  and follow the prompts.  Office hours are 8:00 a.m. to 4:30 p.m. Monday - Friday. Please note that voicemails left after 4:00 p.m. may not be returned until the following business day.  We are closed weekends and major holidays. You have access to a nurse at all times for urgent questions. Please call the main number to the clinic Dept: [phone] and follow the prompts.   For any non-urgent questions, you may also contact your provider using MyChart. We now offer e-Visits for anyone 18 and older to request care online for non-urgent symptoms. For details visit [email or web address].   Also download the MyChart app! Go to the app store, search "MyChart", open the app, select [facility], and log in with your MyChart username and password.   

## 2023-01-03 LAB — MULTIPLE MYELOMA PANEL, SERUM
Albumin SerPl Elph-Mcnc: 3.2 g/dL (ref 2.9–4.4)
Albumin/Glob SerPl: 0.9 (ref 0.7–1.7)
Alpha 1: 0.2 g/dL (ref 0.0–0.4)
Alpha2 Glob SerPl Elph-Mcnc: 0.7 g/dL (ref 0.4–1.0)
B-Globulin SerPl Elph-Mcnc: 0.8 g/dL (ref 0.7–1.3)
Gamma Glob SerPl Elph-Mcnc: 2 g/dL — ABNORMAL HIGH (ref 0.4–1.8)
Globulin, Total: 3.7 g/dL (ref 2.2–3.9)
IgA: 79 mg/dL — ABNORMAL LOW (ref 87–352)
IgG (Immunoglobin G), Serum: 2632 mg/dL — ABNORMAL HIGH (ref 586–1602)
IgM (Immunoglobulin M), Srm: 37 mg/dL (ref 26–217)
M Protein SerPl Elph-Mcnc: 1.6 g/dL — ABNORMAL HIGH
Total Protein ELP: 6.9 g/dL (ref 6.0–8.5)

## 2023-01-03 MED FILL — Dexamethasone Sodium Phosphate Inj 100 MG/10ML: INTRAMUSCULAR | Qty: 2 | Status: AC

## 2023-01-06 ENCOUNTER — Inpatient Hospital Stay: Payer: 59

## 2023-01-06 ENCOUNTER — Other Ambulatory Visit: Payer: 59

## 2023-01-06 ENCOUNTER — Encounter: Payer: Self-pay | Admitting: Hematology and Oncology

## 2023-01-06 ENCOUNTER — Encounter: Payer: Self-pay | Admitting: Adult Health

## 2023-01-06 ENCOUNTER — Other Ambulatory Visit: Payer: Self-pay | Admitting: *Deleted

## 2023-01-06 ENCOUNTER — Other Ambulatory Visit: Payer: Self-pay

## 2023-01-06 ENCOUNTER — Inpatient Hospital Stay (HOSPITAL_BASED_OUTPATIENT_CLINIC_OR_DEPARTMENT_OTHER): Payer: 59 | Admitting: Adult Health

## 2023-01-06 VITALS — BP 132/81 | HR 88 | Temp 97.9°F | Resp 18

## 2023-01-06 VITALS — BP 136/82 | HR 80 | Temp 97.8°F | Resp 18 | Ht 66.0 in | Wt 244.0 lb

## 2023-01-06 DIAGNOSIS — D472 Monoclonal gammopathy: Secondary | ICD-10-CM

## 2023-01-06 DIAGNOSIS — C9000 Multiple myeloma not having achieved remission: Secondary | ICD-10-CM

## 2023-01-06 DIAGNOSIS — Z95828 Presence of other vascular implants and grafts: Secondary | ICD-10-CM

## 2023-01-06 DIAGNOSIS — Z5112 Encounter for antineoplastic immunotherapy: Secondary | ICD-10-CM | POA: Diagnosis not present

## 2023-01-06 LAB — CBC WITH DIFFERENTIAL (CANCER CENTER ONLY)
Abs Immature Granulocytes: 0.12 10*3/uL — ABNORMAL HIGH (ref 0.00–0.07)
Basophils Absolute: 0 10*3/uL (ref 0.0–0.1)
Basophils Relative: 0 %
Eosinophils Absolute: 0 10*3/uL (ref 0.0–0.5)
Eosinophils Relative: 0 %
HCT: 31.6 % — ABNORMAL LOW (ref 36.0–46.0)
Hemoglobin: 9.8 g/dL — ABNORMAL LOW (ref 12.0–15.0)
Immature Granulocytes: 1 %
Lymphocytes Relative: 24 %
Lymphs Abs: 2.8 10*3/uL (ref 0.7–4.0)
MCH: 29.5 pg (ref 26.0–34.0)
MCHC: 31 g/dL (ref 30.0–36.0)
MCV: 95.2 fL (ref 80.0–100.0)
Monocytes Absolute: 2.2 10*3/uL — ABNORMAL HIGH (ref 0.1–1.0)
Monocytes Relative: 19 %
Neutro Abs: 6.7 10*3/uL (ref 1.7–7.7)
Neutrophils Relative %: 56 %
Platelet Count: 231 10*3/uL (ref 150–400)
RBC: 3.32 MIL/uL — ABNORMAL LOW (ref 3.87–5.11)
RDW: 16.7 % — ABNORMAL HIGH (ref 11.5–15.5)
WBC Count: 11.8 10*3/uL — ABNORMAL HIGH (ref 4.0–10.5)
nRBC: 0.3 % — ABNORMAL HIGH (ref 0.0–0.2)

## 2023-01-06 LAB — CMP (CANCER CENTER ONLY)
ALT: 14 U/L (ref 0–44)
AST: 16 U/L (ref 15–41)
Albumin: 3.1 g/dL — ABNORMAL LOW (ref 3.5–5.0)
Alkaline Phosphatase: 38 U/L (ref 38–126)
Anion gap: 5 (ref 5–15)
BUN: 17 mg/dL (ref 6–20)
CO2: 27 mmol/L (ref 22–32)
Calcium: 8.2 mg/dL — ABNORMAL LOW (ref 8.9–10.3)
Chloride: 105 mmol/L (ref 98–111)
Creatinine: 0.69 mg/dL (ref 0.44–1.00)
GFR, Estimated: >60 mL/min (ref >60)
Glucose, Bld: 107 mg/dL — ABNORMAL HIGH (ref 70–99)
Potassium: 3.4 mmol/L — ABNORMAL LOW (ref 3.5–5.1)
Sodium: 137 mmol/L (ref 135–145)
Total Bilirubin: 0.4 mg/dL (ref 0.3–1.2)
Total Protein: 6.6 g/dL (ref 6.5–8.1)

## 2023-01-06 LAB — PREGNANCY, URINE: Preg Test, Ur: NEGATIVE

## 2023-01-06 MED ORDER — SODIUM CHLORIDE 0.9 % IV SOLN
20.0000 mg | Freq: Once | INTRAVENOUS | Status: DC
  Filled 2023-01-06: qty 2

## 2023-01-06 MED ORDER — DIPHENHYDRAMINE HCL 25 MG PO CAPS
50.0000 mg | ORAL_CAPSULE | Freq: Once | ORAL | Status: AC
  Administered 2023-01-06: 50 mg via ORAL
  Filled 2023-01-06: qty 2

## 2023-01-06 MED ORDER — SODIUM CHLORIDE 0.9% FLUSH
10.0000 mL | Freq: Once | INTRAVENOUS | Status: AC
  Administered 2023-01-06: 10 mL

## 2023-01-06 MED ORDER — LENALIDOMIDE 15 MG PO CAPS
15.0000 mg | ORAL_CAPSULE | Freq: Every day | ORAL | 0 refills | Status: DC
Start: 2023-01-06 — End: 2023-02-03

## 2023-01-06 MED ORDER — ACETAMINOPHEN 325 MG PO TABS
650.0000 mg | ORAL_TABLET | Freq: Once | ORAL | Status: AC
  Administered 2023-01-06: 650 mg via ORAL
  Filled 2023-01-06: qty 2

## 2023-01-06 MED ORDER — SODIUM CHLORIDE 0.9% FLUSH
10.0000 mL | INTRAVENOUS | Status: DC | PRN
  Administered 2023-01-06: 10 mL

## 2023-01-06 MED ORDER — PROCHLORPERAZINE MALEATE 10 MG PO TABS
10.0000 mg | ORAL_TABLET | Freq: Four times a day (QID) | ORAL | Status: DC | PRN
  Administered 2023-01-06: 10 mg via ORAL
  Filled 2023-01-06: qty 1

## 2023-01-06 MED ORDER — SODIUM CHLORIDE 0.9 % IV SOLN
20.0000 mg | Freq: Once | INTRAVENOUS | Status: AC
  Administered 2023-01-06: 20 mg via INTRAVENOUS
  Filled 2023-01-06: qty 2

## 2023-01-06 MED ORDER — SODIUM CHLORIDE 0.9 % IV SOLN: Freq: Once | INTRAVENOUS | Status: AC

## 2023-01-06 MED ORDER — SODIUM CHLORIDE 0.9 % IV SOLN
16.0000 mg/kg | Freq: Once | INTRAVENOUS | Status: AC
  Administered 2023-01-06: 1600 mg via INTRAVENOUS
  Filled 2023-01-06: qty 80

## 2023-01-06 MED ORDER — HEPARIN SOD (PORK) LOCK FLUSH 100 UNIT/ML IV SOLN
500.0000 [IU] | Freq: Once | INTRAVENOUS | Status: AC | PRN
  Administered 2023-01-06: 500 [IU]

## 2023-01-06 NOTE — Assessment & Plan Note (Signed)
Kara Medina is a 47 year old woman with multiple myeloma here today for follow-up and evaluation prior to receiving daratumumab, Velcade, Revlimid, dexamethasone, Zometa.  Multiple myeloma: She is tolerating treatment well.  She will proceed with therapy today.  Her CBC today is stable. Hypocalcemia: Her calcium level is pending.  Once we get her results we can give her recommendations if needed. Hypokalemia: Her potassium level is pending.  Once we get results we can give her recommendations if needed. Fatigue: Continued energy conservation.  Kara Medina will return to clinic weekly for labs, follow-up, and treatment.

## 2023-01-06 NOTE — Patient Instructions (Signed)
[facility]  Discharge Instructions: Thank you for choosing [facility] to provide your oncology and hematology care.   If you have a lab appointment with the Cancer Center, please go directly to the Cancer Center and check in at the registration area.   Wear comfortable clothing and clothing appropriate for easy access to any Portacath or PICC line.   We strive to give you quality time with your provider. You may need to reschedule your appointment if you arrive late (15 or more minutes).  Arriving late affects you and other patients whose appointments are after yours.  Also, if you miss three or more appointments without notifying the office, you may be dismissed from the clinic at the provider's discretion.      For prescription refill requests, have your pharmacy contact our office and allow 72 hours for refills to be completed.    Today you received the following chemotherapy and/or immunotherapy agent: Daratumumab (Darzalex)  To help prevent nausea and vomiting after your treatment, we encourage you to take your nausea medication as directed.  BELOW ARE SYMPTOMS THAT SHOULD BE REPORTED IMMEDIATELY: *FEVER GREATER THAN 100.4 F (38 C) OR HIGHER *CHILLS OR SWEATING *NAUSEA AND VOMITING THAT IS NOT CONTROLLED WITH YOUR NAUSEA MEDICATION *UNUSUAL SHORTNESS OF BREATH *UNUSUAL BRUISING OR BLEEDING *URINARY PROBLEMS (pain or burning when urinating, or frequent urination) *BOWEL PROBLEMS (unusual diarrhea, constipation, pain near the anus) TENDERNESS IN MOUTH AND THROAT WITH OR WITHOUT PRESENCE OF ULCERS (sore throat, sores in mouth, or a toothache) UNUSUAL RASH, SWELLING OR PAIN  UNUSUAL VAGINAL DISCHARGE OR ITCHING   Items with * indicate a potential emergency and should be followed up as soon as possible or go to the Emergency Department if any problems should occur.  Please show the CHEMOTHERAPY ALERT CARD or IMMUNOTHERAPY ALERT CARD  at check-in to the Emergency Department and triage nurse.  Should you have questions after your visit or need to cancel or reschedule your appointment, please contact [facility]  Dept: [phone]  and follow the prompts.  Office hours are 8:00 a.m. to 4:30 p.m. Monday - Friday. Please note that voicemails left after 4:00 p.m. may not be returned until the following business day.  We are closed weekends and major holidays. You have access to a nurse at all times for urgent questions. Please call the main number to the clinic Dept: [phone] and follow the prompts.   For any non-urgent questions, you may also contact your provider using MyChart. We now offer e-Visits for anyone 18 and older to request care online for non-urgent symptoms. For details visit [email or web address].   Also download the MyChart app! Go to the app store, search "MyChart", open the app, select [facility], and log in with your MyChart username and password. Daratumumab Injection What is this medication? DARATUMUMAB (dar a toom ue mab) treats multiple myeloma, a type of bone marrow cancer. It works by helping your immune system slow or stop the spread of cancer cells. It is a monoclonal antibody. This medicine may be used for other purposes; ask your health care provider or pharmacist if you have questions. COMMON BRAND NAME(S): DARZALEX What should I tell my care team before I take this medication? They need to know if you have any of these conditions: Hereditary fructose intolerance Infection, such as chickenpox, herpes, hepatitis B Lung or breathing disease, such as asthma, COPD An unusual or allergic reaction   to daratumumab, sorbitol, other medications, foods, dyes, or preservatives Pregnant or trying to get pregnant Breastfeeding How should I use this medication? This medication is injected into a vein. It is given by your care team in a hospital or clinic setting. Talk  to your care team about the use of this medication in children. Special care may be needed. Overdosage: If you think you have taken too much of this medicine contact a poison control center or emergency room at once. NOTE: This medicine is only for you. Do not share this medicine with others. What if I miss a dose? Keep appointments for follow-up doses. It is important not to miss your dose. Call your care team if you are unable to keep an appointment. What may interact with this medication? Interactions have not been studied. This list may not describe all possible interactions. Give your health care provider a list of all the medicines, herbs, non-prescription drugs, or dietary supplements you use. Also tell them if you smoke, drink alcohol, or use illegal drugs. Some items may interact with your medicine. What should I watch for while using this medication? Your condition will be monitored carefully while you are receiving this medication. This medication can cause serious allergic reactions. To reduce your risk, your care team may give you other medication to take before receiving this one. Be sure to follow the directions from your care team. This medication can affect the results of blood tests to match your blood type. These changes can last for up to 6 months after the final dose. Your care team will do blood tests to match your blood type before you start treatment. Tell all of your care team that you are being treated with this medication before receiving a blood transfusion. This medication can affect the results of some tests used to determine treatment response; extra tests may be needed to evaluate response. Talk to your care team if you wish to become pregnant or think you are pregnant. This medication can cause serious birth defects if taken during pregnancy and for 3 months after the last dose. A reliable form of contraception is recommended while taking this medication and for 3 months  after the last dose. Talk to your care team about effective forms of contraception. Do not breast-feed while taking this medication. What side effects may I notice from receiving this medication? Side effects that you should report to your care team as soon as possible: Allergic reactions--skin rash, itching, hives, swelling of the face, lips, tongue, or throat Infection--fever, chills, cough, sore throat, wounds that don't heal, pain or trouble when passing urine, general feeling of discomfort or being unwell Infusion reactions--chest pain, shortness of breath or trouble breathing, feeling faint or lightheaded Unusual bruising or bleeding Side effects that usually do not require medical attention (report to your care team if they continue or are bothersome): Constipation Diarrhea Fatigue Nausea Pain, tingling, or numbness in the hands or feet Swelling of the ankles, hands, or feet This list may not describe all possible side effects. Call your doctor for medical advice about side effects. You may report side effects to FDA at 1-800-FDA-1088. Where should I keep my medication? This medication is given in a hospital or clinic. It will not be stored at home. NOTE: This sheet is a summary. It may not cover all possible information. If you have questions about this medicine, talk to your doctor, pharmacist, or health care provider.  2024 Elsevier/Gold Standard (2022-05-09 00:00:00)  Zoledronic Acid   Injection (Cancer) What is this medication? ZOLEDRONIC ACID (ZOE le dron ik AS id) treats high calcium levels in the blood caused by cancer. It may also be used with chemotherapy to treat weakened bones caused by cancer. It works by slowing down the release of calcium from bones. This lowers calcium levels in your blood. It also makes your bones stronger and less likely to break (fracture). It belongs to a group of medications called bisphosphonates. This medicine may be used for other purposes; ask  your health care provider or pharmacist if you have questions. COMMON BRAND NAME(S): Zometa, Zometa Powder What should I tell my care team before I take this medication? They need to know if you have any of these conditions: Dehydration Dental disease Kidney disease Liver disease Low levels of calcium in the blood Lung or breathing disease, such as asthma Receiving steroids, such as dexamethasone or prednisone An unusual or allergic reaction to zoledronic acid, other medications, foods, dyes, or preservatives Pregnant or trying to get pregnant Breast-feeding How should I use this medication? This medication is injected into a vein. It is given by your care team in a hospital or clinic setting. Talk to your care team about the use of this medication in children. Special care may be needed. Overdosage: If you think you have taken too much of this medicine contact a poison control center or emergency room at once. NOTE: This medicine is only for you. Do not share this medicine with others. What if I miss a dose? Keep appointments for follow-up doses. It is important not to miss your dose. Call your care team if you are unable to keep an appointment. What may interact with this medication? Certain antibiotics given by injection Diuretics, such as bumetanide, furosemide NSAIDs, medications for pain and inflammation, such as ibuprofen or naproxen Teriparatide Thalidomide This list may not describe all possible interactions. Give your health care provider a list of all the medicines, herbs, non-prescription drugs, or dietary supplements you use. Also tell them if you smoke, drink alcohol, or use illegal drugs. Some items may interact with your medicine. What should I watch for while using this medication? Visit your care team for regular checks on your progress. It may be some time before you see the benefit from this medication. Some people who take this medication have severe bone, joint, or  muscle pain. This medication may also increase your risk for jaw problems or a broken thigh bone. Tell your care team right away if you have severe pain in your jaw, bones, joints, or muscles. Tell you care team if you have any pain that does not go away or that gets worse. Tell your dentist and dental surgeon that you are taking this medication. You should not have major dental surgery while on this medication. See your dentist to have a dental exam and fix any dental problems before starting this medication. Take good care of your teeth while on this medication. Make sure you see your dentist for regular follow-up appointments. You should make sure you get enough calcium and vitamin D while you are taking this medication. Discuss the foods you eat and the vitamins you take with your care team. Check with your care team if you have severe diarrhea, nausea, and vomiting, or if you sweat a lot. The loss of too much body fluid may make it dangerous for you to take this medication. You may need bloodwork while taking this medication. Talk to your care team if you   wish to become pregnant or think you might be pregnant. This medication can cause serious birth defects. What side effects may I notice from receiving this medication? Side effects that you should report to your care team as soon as possible: Allergic reactions--skin rash, itching, hives, swelling of the face, lips, tongue, or throat Kidney injury--decrease in the amount of urine, swelling of the ankles, hands, or feet Low calcium level--muscle pain or cramps, confusion, tingling, or numbness in the hands or feet Osteonecrosis of the jaw--pain, swelling, or redness in the mouth, numbness of the jaw, poor healing after dental work, unusual discharge from the mouth, visible bones in the mouth Severe bone, joint, or muscle pain Side effects that usually do not require medical attention (report to your care team if they continue or are  bothersome): Constipation Fatigue Fever Loss of appetite Nausea Stomach pain This list may not describe all possible side effects. Call your doctor for medical advice about side effects. You may report side effects to FDA at 1-800-FDA-1088. Where should I keep my medication? This medication is given in a hospital or clinic. It will not be stored at home. NOTE: This sheet is a summary. It may not cover all possible information. If you have questions about this medicine, talk to your doctor, pharmacist, or health care provider.  2024 Elsevier/Gold Standard (2021-08-24 00:00:00)  Hypokalemia Hypokalemia means that the amount of potassium in the blood is lower than normal. Potassium is a mineral (electrolyte) that helps regulate the amount of fluid in the body. It also stimulates muscle tightening (contraction) and helps nerves work properly. Normally, most of the body's potassium is inside cells, and only a very small amount is in the blood. Because the amount in the blood is so small, minor changes to potassium levels in the blood can be life-threatening. What are the causes? This condition may be caused by: Antibiotic medicine. Diarrhea or vomiting. Taking too much of a medicine that helps you have a bowel movement (laxative) can cause diarrhea and lead to hypokalemia. Chronic kidney disease (CKD). Medicines that help the body get rid of excess fluid (diuretics). Eating disorders, such as anorexia or bulimia. Low magnesium levels in the body. Sweating a lot. What are the signs or symptoms? Symptoms of this condition include: Weakness. Constipation. Fatigue. Muscle cramps. Mental confusion. Skipped heartbeats or irregular heartbeat (palpitations). Tingling or numbness. How is this diagnosed? This condition is diagnosed with a blood test. How is this treated? This condition may be treated by: Taking potassium supplements. Adjusting the medicines that you take. Eating more foods  that contain a lot of potassium. If your potassium level is very low, you may need to get potassium through an IV and be monitored in the hospital. Follow these instructions at home: Eating and drinking  Eat a healthy diet. A healthy diet includes fresh fruits and vegetables, whole grains, healthy fats, and lean proteins. If told, eat more foods that contain a lot of potassium. These include: Nuts, such as peanuts and pistachios. Seeds, such as sunflower seeds and pumpkin seeds. Peas, lentils, and lima beans. Whole grain and bran cereals and breads. Fresh fruits and vegetables, such as apricots, avocado, bananas, cantaloupe, kiwi, oranges, tomatoes, asparagus, and potatoes. Juices, such as orange, tomato, and prune. Lean meats, including fish. Milk and milk products, such as yogurt. General instructions Take over-the-counter and prescription medicines only as told by your health care provider. This includes vitamins, natural food products, and supplements. Keep all follow-up visits. This is   important. Contact a health care provider if: You have weakness that gets worse. You feel your heart pounding or racing. You vomit. You have diarrhea. You have diabetes and you have trouble keeping your blood sugar in your target range. Get help right away if: You have chest pain. You have shortness of breath. You have vomiting or diarrhea that lasts for more than 2 days. You faint. These symptoms may be an emergency. Get help right away. Call 911. Do not wait to see if the symptoms will go away. Do not drive yourself to the hospital. Summary Hypokalemia means that the amount of potassium in the blood is lower than normal. This condition is diagnosed with a blood test. Hypokalemia may be treated by taking potassium supplements, adjusting the medicines that you take, or eating more foods that are high in potassium. If your potassium level is very low, you may need to get potassium through an IV  and be monitored in the hospital. This information is not intended to replace advice given to you by your health care provider. Make sure you discuss any questions you have with your health care provider. Document Revised: 03/15/2021 Document Reviewed: 03/15/2021 Elsevier Patient Education  2024 [employer] Inc.     

## 2023-01-06 NOTE — Progress Notes (Signed)
[facility] Cancer Follow up:    Kara Saint, MD [address] [place] [address]   DIAGNOSIS:  Cancer Staging  No matching staging information was found for the patient.  SUMMARY OF ONCOLOGIC HISTORY: Oncology History  Multiple myeloma (HCC)  09/12/2022 Initial Diagnosis   Multiple myeloma (HCC)   09/20/2022 - 10/04/2022 Chemotherapy   Patient is on Treatment Plan : MYELOMA NEWLY DIAGNOSED TRANSPLANT CANDIDATE DaraVRd (Daratumumab IV) q21d x 6 Cycles (Induction/Consolidation)     10/11/2022 - 10/22/2022 Chemotherapy   Patient is on Treatment Plan : MYELOMA NON-TRANSPLANT CANDIDATES VRd SQ q21d     11/04/2022 -  Chemotherapy   Patient is on Treatment Plan : MYELOMA NEWLY DIAGNOSED TRANSPLANT CANDIDATE DaraVRd (Daratumumab IV) q21d x 6 Cycles (Induction/Consolidation)       CURRENT THERAPY: DaraVRd; Zometa  INTERVAL HISTORY: Kara Medina 47 y.o. female returns for follow-up and evaluation while undergoing treatment with daratumumab, Velcade, Revlimid, and dexamethasone; as well as Zometa.  She is tolerating these well and denies any significant issues today.  She is not experiencing peripheral neuropathy.  She continues to have her Port-A-Cath and tells me that blood drawls have been uneventful through this.   Patient Active Problem List   Diagnosis Date Noted   Port-A-Cath in place 01/06/2023   HTN (hypertension) 11/14/2022   Multiple myeloma (HCC) 09/12/2022   MGUS (monoclonal gammopathy of unknown significance) 09/10/2022   Hypertensive urgency 08/29/2022   Iron deficiency anemia 08/29/2022   LVH (left ventricular hypertrophy) 08/29/2022   Normocytic anemia 08/29/2022    is allergic to amoxicillin.  MEDICAL HISTORY: Past Medical History:  Diagnosis Date   Anemia    Gestational diabetes    diet controlled   Postpartum care following vaginal delivery (7/27) 02/09/2016   Pregnancy induced hypertension    Trigeminal neuralgia    2012  corrected with surgery    SURGICAL HISTORY: Past Surgical History:  Procedure Laterality Date   APPENDECTOMY  2008   IR IMAGING GUIDED PORT INSERTION  12/03/2022   TRIGEMINAL NERVE DECOMPRESSION  2012    SOCIAL HISTORY: Social History   Socioeconomic History   Marital status: Married    Spouse name: Not on file   Number of children: Not on file   Years of education: Not on file   Highest education level: Bachelor's degree (e.g., BA, AB, BS)  Occupational History   Not on file  Tobacco Use   Smoking status: Never   Smokeless tobacco: Never  Vaping Use   Vaping Use: Never used  Substance and Sexual Activity   Alcohol use: No   Drug use: No   Sexual activity: Yes  Other Topics Concern   Not on file  Social History Narrative   Not on file   Social Determinants of Health   Financial Resource Strain: Low Risk  (08/23/2022)   Overall Financial Resource Strain (CARDIA)    Difficulty of Paying Living Expenses: Not hard at all  Food Insecurity: No Food Insecurity (08/23/2022)   Hunger Vital Sign    Worried About Running Out of Food in the Last Year: Never true    Ran Out of Food in the Last Year: Never true  Transportation Needs: No Transportation Needs (08/23/2022)   PRAPARE - Administrator, Civil Service (Medical): No    Lack of Transportation (Non-Medical): No  Physical Activity: Insufficiently Active (08/23/2022)   Exercise Vital Sign    Days of Exercise per Week: 2 days  Minutes of Exercise per Session: 30 min  Stress: Stress Concern Present (08/23/2022)   [employer] of Occupational Health - Occupational Stress Questionnaire    Feeling of Stress : To some extent  Social Connections: Unknown (08/23/2022)   Social Connection and Isolation Panel [NHANES]    Frequency of Communication with Friends and Family: More than three times a week    Frequency of Social Gatherings with Friends and Family: Once a week    Attends Religious Services: Patient declined     Database administrator or Organizations: Yes    Attends Engineer, structural: More than 4 times per year    Marital Status: Married  Catering manager Violence: Not on file    FAMILY HISTORY: Family History  Problem Relation Age of Onset   Cancer Other    Hypertension Other    Healthy Father    Cancer Paternal Grandfather        stomach, lung   Hypertension Mother    Breast cancer Mother 77   Hypertension Maternal Grandmother    Arthritis Maternal Grandmother    Hypertension Maternal Grandfather    Other Brother        ?prediabetic   Diabetes Paternal Grandmother    Hypertension Paternal Grandmother    Autism Son     Review of Systems  Constitutional:  Positive for fatigue (Mild). Negative for appetite change, chills, fever and unexpected weight change.  HENT:   Negative for hearing loss, lump/mass and trouble swallowing.   Eyes:  Negative for eye problems and icterus.  Respiratory:  Negative for chest tightness, cough and shortness of breath.   Cardiovascular:  Negative for chest pain, leg swelling and palpitations.  Gastrointestinal:  Negative for abdominal distention, abdominal pain, constipation, diarrhea, nausea and vomiting.  Endocrine: Negative for hot flashes.  Genitourinary:  Negative for difficulty urinating.   Musculoskeletal:  Negative for arthralgias.  Skin:  Negative for itching and rash.  Neurological:  Negative for dizziness, extremity weakness, headaches and numbness.  Hematological:  Negative for adenopathy. Does not bruise/bleed easily.  Psychiatric/Behavioral:  Negative for depression. The patient is not nervous/anxious.       PHYSICAL EXAMINATION   Onc Performance Status - 01/06/23 0923       ECOG Perf Status   ECOG Perf Status Fully active, able to carry on all pre-disease performance without restriction      KPS SCALE   KPS % SCORE Normal, no compliants, no evidence of disease             Vitals:   01/06/23 0907  BP: 136/82   Pulse: 80  Resp: 18  Temp: 97.8 F (36.6 C)  SpO2: 100%    Physical Exam Constitutional:      General: She is not in acute distress.    Appearance: Normal appearance. She is not toxic-appearing.  HENT:     Head: Normocephalic and atraumatic.     Mouth/Throat:     Mouth: Mucous membranes are moist.     Pharynx: Oropharynx is clear. No oropharyngeal exudate or posterior oropharyngeal erythema.  Eyes:     General: No scleral icterus. Cardiovascular:     Rate and Rhythm: Normal rate and regular rhythm.     Pulses: Normal pulses.     Heart sounds: Normal heart sounds.  Pulmonary:     Effort: Pulmonary effort is normal.     Breath sounds: Normal breath sounds.  Abdominal:     General: Abdomen is flat. Bowel sounds  are normal. There is no distension.     Palpations: Abdomen is soft.     Tenderness: There is no abdominal tenderness.  Musculoskeletal:        General: No swelling.     Cervical back: Neck supple.  Lymphadenopathy:     Cervical: No cervical adenopathy.  Skin:    General: Skin is warm and dry.     Findings: No rash.  Neurological:     General: No focal deficit present.     Mental Status: She is alert.  Psychiatric:        Mood and Affect: Mood normal.        Behavior: Behavior normal.     LABORATORY DATA:  CBC    Component Value Date/Time   WBC 11.8 (H) 01/06/2023 0839   WBC 7.9 12/23/2022 0905   RBC 3.32 (L) 01/06/2023 0839   HGB 9.8 (L) 01/06/2023 0839   HCT 31.6 (L) 01/06/2023 0839   HCT 24.4 (L) 09/04/2022 1037   PLT 231 01/06/2023 0839   MCV 95.2 01/06/2023 0839   MCH 29.5 01/06/2023 0839   MCHC 31.0 01/06/2023 0839   RDW 16.7 (H) 01/06/2023 0839   LYMPHSABS 2.8 01/06/2023 0839   MONOABS 2.2 (H) 01/06/2023 0839   EOSABS 0.0 01/06/2023 0839   BASOSABS 0.0 01/06/2023 0839    CMP     Component Value Date/Time   NA 137 01/06/2023 0839   K 3.4 (L) 01/06/2023 0839   CL 105 01/06/2023 0839   CO2 27 01/06/2023 0839   GLUCOSE 107 (H)  01/06/2023 0839   BUN 17 01/06/2023 0839   CREATININE 0.69 01/06/2023 0839   CREATININE 0.95 08/23/2022 1631   CALCIUM 8.2 (L) 01/06/2023 0839   PROT 6.6 01/06/2023 0839   ALBUMIN 3.1 (L) 01/06/2023 0839   AST 16 01/06/2023 0839   ALT 14 01/06/2023 0839   ALKPHOS 38 01/06/2023 0839   BILITOT 0.4 01/06/2023 0839   GFRNONAA >60 01/06/2023 0839   GFRAA >60 02/09/2016 0525       PENDING LABS:   RADIOGRAPHIC STUDIES:  No results found.   PATHOLOGY:     ASSESSMENT and THERAPY PLAN:   Multiple myeloma (HCC) Loany is a 47 year old woman with multiple myeloma here today for follow-up and evaluation prior to receiving daratumumab, Velcade, Revlimid, dexamethasone, Zometa.  Multiple myeloma: She is tolerating treatment well.  She will proceed with therapy today.  Her CBC today is stable. Hypocalcemia: Her calcium level is pending.  Once we get her results we can give her recommendations if needed. Hypokalemia: Her potassium level is pending.  Once we get results we can give her recommendations if needed. Fatigue: Continued energy conservation.  Sherline will return to clinic weekly for labs, follow-up, and treatment.   All questions were answered. The patient knows to call the clinic with any problems, questions or concerns. We can certainly see the patient much sooner if necessary.  Total encounter time:20 minutes*in face-to-face visit time, chart review, lab review, care coordination, order entry, and documentation of the encounter time.  Lillard Anes, NP 01/06/23 9:55 AM Medical Oncology and Hematology [facility] [place] [address] [place], [place] [address] Tel. [phone]    Fax. [phone]  *Total Encounter Time as defined by the Centers for Medicare and Medicaid Services includes, in addition to the face-to-face time of a patient visit (documented in the note above) non-face-to-face time: obtaining and reviewing outside history, ordering and reviewing  medications, tests or procedures, care coordination (  communications with other health care professionals or caregivers) and documentation in the medical record.

## 2023-01-07 LAB — IGG, IGA, IGM
IgA: 82 mg/dL — ABNORMAL LOW (ref 87–352)
IgG (Immunoglobin G), Serum: 2391 mg/dL — ABNORMAL HIGH (ref 586–1602)
IgM (Immunoglobulin M), Srm: 30 mg/dL (ref 26–217)

## 2023-01-09 ENCOUNTER — Ambulatory Visit: Payer: 59

## 2023-01-10 MED FILL — Dexamethasone Sodium Phosphate Inj 100 MG/10ML: INTRAMUSCULAR | Qty: 2 | Status: AC

## 2023-01-13 ENCOUNTER — Other Ambulatory Visit: Payer: 59

## 2023-01-13 ENCOUNTER — Ambulatory Visit: Payer: 59

## 2023-01-13 ENCOUNTER — Inpatient Hospital Stay: Payer: 59 | Attending: Hematology and Oncology

## 2023-01-13 ENCOUNTER — Ambulatory Visit: Payer: 59 | Admitting: Hematology and Oncology

## 2023-01-13 ENCOUNTER — Encounter: Payer: Self-pay | Admitting: Adult Health

## 2023-01-13 ENCOUNTER — Other Ambulatory Visit: Payer: Self-pay

## 2023-01-13 ENCOUNTER — Inpatient Hospital Stay: Payer: 59

## 2023-01-13 ENCOUNTER — Inpatient Hospital Stay (HOSPITAL_BASED_OUTPATIENT_CLINIC_OR_DEPARTMENT_OTHER): Payer: 59 | Admitting: Adult Health

## 2023-01-13 VITALS — BP 125/82 | HR 93 | Resp 18

## 2023-01-13 DIAGNOSIS — Z5112 Encounter for antineoplastic immunotherapy: Secondary | ICD-10-CM | POA: Insufficient documentation

## 2023-01-13 DIAGNOSIS — D472 Monoclonal gammopathy: Secondary | ICD-10-CM

## 2023-01-13 DIAGNOSIS — Z95828 Presence of other vascular implants and grafts: Secondary | ICD-10-CM

## 2023-01-13 DIAGNOSIS — Z79899 Other long term (current) drug therapy: Secondary | ICD-10-CM | POA: Diagnosis not present

## 2023-01-13 DIAGNOSIS — C9000 Multiple myeloma not having achieved remission: Secondary | ICD-10-CM

## 2023-01-13 LAB — CBC WITH DIFFERENTIAL (CANCER CENTER ONLY)
Abs Immature Granulocytes: 0.08 10*3/uL — ABNORMAL HIGH (ref 0.00–0.07)
Basophils Absolute: 0 10*3/uL (ref 0.0–0.1)
Basophils Relative: 0 %
Eosinophils Absolute: 0 10*3/uL (ref 0.0–0.5)
Eosinophils Relative: 0 %
HCT: 32 % — ABNORMAL LOW (ref 36.0–46.0)
Hemoglobin: 10.3 g/dL — ABNORMAL LOW (ref 12.0–15.0)
Immature Granulocytes: 1 %
Lymphocytes Relative: 23 %
Lymphs Abs: 2.8 10*3/uL (ref 0.7–4.0)
MCH: 30.6 pg (ref 26.0–34.0)
MCHC: 32.2 g/dL (ref 30.0–36.0)
MCV: 95 fL (ref 80.0–100.0)
Monocytes Absolute: 2.5 10*3/uL — ABNORMAL HIGH (ref 0.1–1.0)
Monocytes Relative: 21 %
Neutro Abs: 6.6 10*3/uL (ref 1.7–7.7)
Neutrophils Relative %: 55 %
Platelet Count: 329 10*3/uL (ref 150–400)
RBC: 3.37 MIL/uL — ABNORMAL LOW (ref 3.87–5.11)
RDW: 17.1 % — ABNORMAL HIGH (ref 11.5–15.5)
WBC Count: 12 10*3/uL — ABNORMAL HIGH (ref 4.0–10.5)
nRBC: 0 % (ref 0.0–0.2)

## 2023-01-13 LAB — CMP (CANCER CENTER ONLY)
ALT: 16 U/L (ref 0–44)
AST: 14 U/L — ABNORMAL LOW (ref 15–41)
Albumin: 3.3 g/dL — ABNORMAL LOW (ref 3.5–5.0)
Alkaline Phosphatase: 40 U/L (ref 38–126)
Anion gap: 3 — ABNORMAL LOW (ref 5–15)
BUN: 18 mg/dL (ref 6–20)
CO2: 29 mmol/L (ref 22–32)
Calcium: 8.1 mg/dL — ABNORMAL LOW (ref 8.9–10.3)
Chloride: 106 mmol/L (ref 98–111)
Creatinine: 0.61 mg/dL (ref 0.44–1.00)
GFR, Estimated: >60 mL/min (ref >60)
Glucose, Bld: 78 mg/dL (ref 70–99)
Potassium: 3.5 mmol/L (ref 3.5–5.1)
Sodium: 138 mmol/L (ref 135–145)
Total Bilirubin: 0.5 mg/dL (ref 0.3–1.2)
Total Protein: 7.5 g/dL (ref 6.5–8.1)

## 2023-01-13 MED ORDER — SODIUM CHLORIDE 0.9% FLUSH
10.0000 mL | Freq: Once | INTRAVENOUS | Status: AC | PRN
  Administered 2023-01-13: 10 mL

## 2023-01-13 MED ORDER — ZOLEDRONIC ACID 4 MG/100ML IV SOLN
4.0000 mg | Freq: Once | INTRAVENOUS | Status: AC
  Administered 2023-01-13: 4 mg via INTRAVENOUS
  Filled 2023-01-13: qty 100

## 2023-01-13 MED ORDER — PROCHLORPERAZINE MALEATE 10 MG PO TABS
10.0000 mg | ORAL_TABLET | Freq: Four times a day (QID) | ORAL | Status: DC | PRN
  Administered 2023-01-13: 10 mg via ORAL
  Filled 2023-01-13: qty 1

## 2023-01-13 MED ORDER — ACETAMINOPHEN 325 MG PO TABS
650.0000 mg | ORAL_TABLET | Freq: Once | ORAL | Status: AC
  Administered 2023-01-13: 650 mg via ORAL
  Filled 2023-01-13: qty 2

## 2023-01-13 MED ORDER — SODIUM CHLORIDE 0.9 % IV SOLN
16.0000 mg/kg | Freq: Once | INTRAVENOUS | Status: AC
  Administered 2023-01-13: 1600 mg via INTRAVENOUS
  Filled 2023-01-13: qty 80

## 2023-01-13 MED ORDER — SODIUM CHLORIDE 0.9 % IV SOLN: Freq: Once | INTRAVENOUS | Status: AC

## 2023-01-13 MED ORDER — SODIUM CHLORIDE 0.9 % IV SOLN
20.0000 mg | Freq: Once | INTRAVENOUS | Status: AC
  Administered 2023-01-13: 20 mg via INTRAVENOUS
  Filled 2023-01-13: qty 20

## 2023-01-13 MED ORDER — BORTEZOMIB CHEMO SQ INJECTION 3.5 MG (2.5MG/ML)
1.3000 mg/m2 | Freq: Once | INTRAMUSCULAR | Status: AC
  Administered 2023-01-13: 2.75 mg via SUBCUTANEOUS
  Filled 2023-01-13: qty 1.1

## 2023-01-13 MED ORDER — HEPARIN SOD (PORK) LOCK FLUSH 100 UNIT/ML IV SOLN
500.0000 [IU] | Freq: Once | INTRAVENOUS | Status: AC | PRN
  Administered 2023-01-13: 500 [IU]

## 2023-01-13 MED ORDER — SODIUM CHLORIDE 0.9% FLUSH
10.0000 mL | INTRAVENOUS | Status: DC | PRN
  Administered 2023-01-13: 10 mL

## 2023-01-13 MED ORDER — DIPHENHYDRAMINE HCL 25 MG PO CAPS
50.0000 mg | ORAL_CAPSULE | Freq: Once | ORAL | Status: AC
  Administered 2023-01-13: 50 mg via ORAL
  Filled 2023-01-13: qty 2

## 2023-01-13 NOTE — Assessment & Plan Note (Signed)
Pranathi is a 47 year old woman with multiple myeloma here today for follow-up and evaluation prior to receiving daratumumab, Velcade, Revlimid, dexamethasone, Zometa.  Multiple myeloma: She continues to tolerate treatment quite well today.  Her labs today are stable and she will proceed with therapy today. Hypocalcemia: She is taking Tums 2 tablets twice a day.  Her corrected calcium today is 8.7 and she will proceed with Zometa.  I did recommend that she increase her calcium intake to 2 Tums 3 times a day.  We will recheck her calcium level next week.   Hypokalemia: Her potassium level is normal today.  She is eating potassium rich foods and this is working for her.. Fatigue: She continues to practice energy conservation and will continue this.  Coleman will return to clinic weekly for labs, follow-up, and treatment.  Plans are for her to end treatment in August and go to transplant in September.

## 2023-01-13 NOTE — Patient Instructions (Addendum)
[facility]  Discharge Instructions: Thank you for choosing [facility] to provide your oncology and hematology care.   If you have a lab appointment with the Cancer Center, please go directly to the Cancer Center and check in at the registration area.   Wear comfortable clothing and clothing appropriate for easy access to any Portacath or PICC line.   We strive to give you quality time with your provider. You may need to reschedule your appointment if you arrive late (15 or more minutes).  Arriving late affects you and other patients whose appointments are after yours.  Also, if you miss three or more appointments without notifying the office, you may be dismissed from the clinic at the provider's discretion.      For prescription refill requests, have your pharmacy contact our office and allow 72 hours for refills to be completed.    Today you received the following chemotherapy and/or immunotherapy agents: Velcade, Darzelex  To help prevent nausea and vomiting after your treatment, we encourage you to take your nausea medication as directed.  BELOW ARE SYMPTOMS THAT SHOULD BE REPORTED IMMEDIATELY: *FEVER GREATER THAN 100.4 F (38 C) OR HIGHER *CHILLS OR SWEATING *NAUSEA AND VOMITING THAT IS NOT CONTROLLED WITH YOUR NAUSEA MEDICATION *UNUSUAL SHORTNESS OF BREATH *UNUSUAL BRUISING OR BLEEDING *URINARY PROBLEMS (pain or burning when urinating, or frequent urination) *BOWEL PROBLEMS (unusual diarrhea, constipation, pain near the anus) TENDERNESS IN MOUTH AND THROAT WITH OR WITHOUT PRESENCE OF ULCERS (sore throat, sores in mouth, or a toothache) UNUSUAL RASH, SWELLING OR PAIN  UNUSUAL VAGINAL DISCHARGE OR ITCHING   Items with * indicate a potential emergency and should be followed up as soon as possible or go to the Emergency Department if any problems should occur.  Please show the CHEMOTHERAPY ALERT CARD or IMMUNOTHERAPY ALERT CARD at  check-in to the Emergency Department and triage nurse.  Should you have questions after your visit or need to cancel or reschedule your appointment, please contact [facility]  Dept: [phone]  and follow the prompts.  Office hours are 8:00 a.m. to 4:30 p.m. Monday - Friday. Please note that voicemails left after 4:00 p.m. may not be returned until the following business day.  We are closed weekends and major holidays. You have access to a nurse at all times for urgent questions. Please call the main number to the clinic Dept: [phone] and follow the prompts.   For any non-urgent questions, you may also contact your provider using MyChart. We now offer e-Visits for anyone 67 and older to request care online for non-urgent symptoms. For details visit [email or web address].   Also download the MyChart app! Go to the app store, search "MyChart", open the app, select [facility], and log in with your MyChart username and password.

## 2023-01-13 NOTE — Progress Notes (Signed)
[facility] Cancer Follow up:    Kara Saint, MD [address] [place] [address]   DIAGNOSIS: Multiple Myeloma  SUMMARY OF ONCOLOGIC HISTORY: Oncology History  Multiple myeloma (HCC)  09/12/2022 Initial Diagnosis   Multiple myeloma (HCC)   09/20/2022 - 10/04/2022 Chemotherapy   Patient is on Treatment Plan : MYELOMA NEWLY DIAGNOSED TRANSPLANT CANDIDATE DaraVRd (Daratumumab IV) q21d x 6 Cycles (Induction/Consolidation)     10/11/2022 - 10/22/2022 Chemotherapy   Patient is on Treatment Plan : MYELOMA NON-TRANSPLANT CANDIDATES VRd SQ q21d     11/04/2022 -  Chemotherapy   Patient is on Treatment Plan : MYELOMA NEWLY DIAGNOSED TRANSPLANT CANDIDATE DaraVRd (Daratumumab IV) q21d x 6 Cycles (Induction/Consolidation)       CURRENT THERAPY: Dara VRD  INTERVAL HISTORY: Kara Medina 47 y.o. female returns for follow-up and evaluation prior to receiving Dara VRD.  She is tolerating this well.  She also receives Zometa every 4 weeks and takes Tums 2 tablets twice daily.  She tells me she is taking her calcium faithfully.  She denies any significant issues such as swelling, easy bruising bleeding, peripheral neuropathy, nausea or vomiting, or constipation.  She is feeling well today.  She endorses mild fatigue which is longstanding and she manages this with energy conservation.   Patient Active Problem List   Diagnosis Date Noted   Port-A-Cath in place 01/06/2023   HTN (hypertension) 11/14/2022   Multiple myeloma (HCC) 09/12/2022   MGUS (monoclonal gammopathy of unknown significance) 09/10/2022   Hypertensive urgency 08/29/2022   Iron deficiency anemia 08/29/2022   LVH (left ventricular hypertrophy) 08/29/2022   Normocytic anemia 08/29/2022    is allergic to amoxicillin.  MEDICAL HISTORY: Past Medical History:  Diagnosis Date   Anemia    Gestational diabetes    diet controlled   Postpartum care following vaginal delivery (7/27) 02/09/2016   Pregnancy  induced hypertension    Trigeminal neuralgia    2012 corrected with surgery    SURGICAL HISTORY: Past Surgical History:  Procedure Laterality Date   APPENDECTOMY  2008   IR IMAGING GUIDED PORT INSERTION  12/03/2022   TRIGEMINAL NERVE DECOMPRESSION  2012    SOCIAL HISTORY: Social History   Socioeconomic History   Marital status: Married    Spouse name: Not on file   Number of children: Not on file   Years of education: Not on file   Highest education level: Bachelor's degree (e.g., BA, AB, BS)  Occupational History   Not on file  Tobacco Use   Smoking status: Never   Smokeless tobacco: Never  Vaping Use   Vaping Use: Never used  Substance and Sexual Activity   Alcohol use: No   Drug use: No   Sexual activity: Yes  Other Topics Concern   Not on file  Social History Narrative   Not on file   Social Determinants of Health   Financial Resource Strain: Low Risk  (08/23/2022)   Overall Financial Resource Strain (CARDIA)    Difficulty of Paying Living Expenses: Not hard at all  Food Insecurity: No Food Insecurity (08/23/2022)   Hunger Vital Sign    Worried About Running Out of Food in the Last Year: Never true    Ran Out of Food in the Last Year: Never true  Transportation Needs: No Transportation Needs (08/23/2022)   PRAPARE - Administrator, Civil Service (Medical): No    Lack of Transportation (Non-Medical): No  Physical Activity: Insufficiently Active (08/23/2022)  Exercise Vital Sign    Days of Exercise per Week: 2 days    Minutes of Exercise per Session: 30 min  Stress: Stress Concern Present (08/23/2022)   [employer] of Occupational Health - Occupational Stress Questionnaire    Feeling of Stress : To some extent  Social Connections: Unknown (08/23/2022)   Social Connection and Isolation Panel [NHANES]    Frequency of Communication with Friends and Family: More than three times a week    Frequency of Social Gatherings with Friends and Family: Once a  week    Attends Religious Services: Patient declined    Database administrator or Organizations: Yes    Attends Engineer, structural: More than 4 times per year    Marital Status: Married  Catering manager Violence: Not on file    FAMILY HISTORY: Family History  Problem Relation Age of Onset   Cancer Other    Hypertension Other    Healthy Father    Cancer Paternal Grandfather        stomach, lung   Hypertension Mother    Breast cancer Mother 42   Hypertension Maternal Grandmother    Arthritis Maternal Grandmother    Hypertension Maternal Grandfather    Other Brother        ?prediabetic   Diabetes Paternal Grandmother    Hypertension Paternal Grandmother    Autism Son     Review of Systems  Constitutional:  Positive for fatigue. Negative for appetite change, chills, fever and unexpected weight change.  HENT:   Negative for hearing loss, lump/mass and trouble swallowing.   Eyes:  Negative for eye problems and icterus.  Respiratory:  Negative for chest tightness, cough and shortness of breath.   Cardiovascular:  Negative for chest pain, leg swelling and palpitations.  Gastrointestinal:  Negative for abdominal distention, abdominal pain, constipation, diarrhea, nausea and vomiting.  Endocrine: Negative for hot flashes.  Genitourinary:  Negative for difficulty urinating.   Musculoskeletal:  Negative for arthralgias.  Skin:  Negative for itching and rash.  Neurological:  Negative for dizziness, extremity weakness, headaches and numbness.  Hematological:  Negative for adenopathy. Does not bruise/bleed easily.  Psychiatric/Behavioral:  Negative for depression. The patient is not nervous/anxious.       PHYSICAL EXAMINATION   Onc Performance Status - 01/13/23 1111       ECOG Perf Status   ECOG Perf Status Fully active, able to carry on all pre-disease performance without restriction      KPS SCALE   KPS % SCORE Normal, no compliants, no evidence of disease              Vitals:   01/13/23 1102  BP: (!) 139/90  Pulse: 87  Resp: 18  Temp: 97.9 F (36.6 C)  SpO2: 100%    Physical Exam Constitutional:      General: She is not in acute distress.    Appearance: Normal appearance. She is not toxic-appearing.  HENT:     Head: Normocephalic and atraumatic.     Mouth/Throat:     Mouth: Mucous membranes are moist.     Pharynx: Oropharynx is clear. No oropharyngeal exudate or posterior oropharyngeal erythema.  Eyes:     General: No scleral icterus. Cardiovascular:     Rate and Rhythm: Normal rate and regular rhythm.     Pulses: Normal pulses.     Heart sounds: Normal heart sounds.  Pulmonary:     Effort: Pulmonary effort is normal.     Breath  sounds: Normal breath sounds.  Abdominal:     General: Abdomen is flat. Bowel sounds are normal. There is no distension.     Palpations: Abdomen is soft.     Tenderness: There is no abdominal tenderness.  Musculoskeletal:        General: No swelling.     Cervical back: Neck supple.  Lymphadenopathy:     Cervical: No cervical adenopathy.  Skin:    General: Skin is warm and dry.     Findings: No rash.  Neurological:     General: No focal deficit present.     Mental Status: She is alert.  Psychiatric:        Mood and Affect: Mood normal.        Behavior: Behavior normal.     LABORATORY DATA:  CBC    Component Value Date/Time   WBC 12.0 (H) 01/13/2023 1006   WBC 7.9 12/23/2022 0905   RBC 3.37 (L) 01/13/2023 1006   HGB 10.3 (L) 01/13/2023 1006   HCT 32.0 (L) 01/13/2023 1006   HCT 24.4 (L) 09/04/2022 1037   PLT 329 01/13/2023 1006   MCV 95.0 01/13/2023 1006   MCH 30.6 01/13/2023 1006   MCHC 32.2 01/13/2023 1006   RDW 17.1 (H) 01/13/2023 1006   LYMPHSABS 2.8 01/13/2023 1006   MONOABS 2.5 (H) 01/13/2023 1006   EOSABS 0.0 01/13/2023 1006   BASOSABS 0.0 01/13/2023 1006    CMP     Component Value Date/Time   NA 138 01/13/2023 1006   K 3.5 01/13/2023 1006   CL 106 01/13/2023  1006   CO2 29 01/13/2023 1006   GLUCOSE 78 01/13/2023 1006   BUN 18 01/13/2023 1006   CREATININE 0.61 01/13/2023 1006   CREATININE 0.95 08/23/2022 1631   CALCIUM 8.1 (L) 01/13/2023 1006   PROT 7.5 01/13/2023 1006   ALBUMIN 3.3 (L) 01/13/2023 1006   AST 14 (L) 01/13/2023 1006   ALT 16 01/13/2023 1006   ALKPHOS 40 01/13/2023 1006   BILITOT 0.5 01/13/2023 1006   GFRNONAA >60 01/13/2023 1006   GFRAA >60 02/09/2016 0525     ASSESSMENT and THERAPY PLAN:   Multiple myeloma (HCC) Kara Medina is a 47 year old woman with multiple myeloma here today for follow-up and evaluation prior to receiving daratumumab, Velcade, Revlimid, dexamethasone, Zometa.  Multiple myeloma: She continues to tolerate treatment quite well today.  Her labs today are stable and she will proceed with therapy today. Hypocalcemia: She is taking Tums 2 tablets twice a day.  Her corrected calcium today is 8.7 and she will proceed with Zometa.  I did recommend that she increase her calcium intake to 2 Tums 3 times a day.  We will recheck her calcium level next week.   Hypokalemia: Her potassium level is normal today.  She is eating potassium rich foods and this is working for her.. Fatigue: She continues to practice energy conservation and will continue this.  Kara Medina will return to clinic weekly for labs, follow-up, and treatment.  Plans are for her to end treatment in August and go to transplant in September.     All questions were answered. The patient knows to call the clinic with any problems, questions or concerns. We can certainly see the patient much sooner if necessary.  Total encounter time:20 minutes*in face-to-face visit time, chart review, lab review, care coordination, order entry, and documentation of the encounter time.    Lillard Anes, NP 01/13/23 11:30 AM Medical Oncology and Hematology [facility] [address] [place], [place] [address] Tel. [phone]    Fax. [phone]  *Total  Encounter Time as defined by the Centers for Medicare and Medicaid Services includes, in addition to the face-to-face time of a patient visit (documented in the note above) non-face-to-face time: obtaining and reviewing outside history, ordering and reviewing medications, tests or procedures, care coordination (communications with other health care professionals or caregivers) and documentation in the medical record.

## 2023-01-13 NOTE — Progress Notes (Signed)
Per Dr. Al Pimple and Lillard Anes NP - okay to proceed with zometa infusion with patient receiving crown tomorrow, 01/14/23.

## 2023-01-14 ENCOUNTER — Ambulatory Visit: Payer: 59 | Admitting: Neurology

## 2023-01-14 DIAGNOSIS — G250 Essential tremor: Secondary | ICD-10-CM

## 2023-01-14 DIAGNOSIS — G4731 Primary central sleep apnea: Secondary | ICD-10-CM

## 2023-01-14 DIAGNOSIS — R351 Nocturia: Secondary | ICD-10-CM

## 2023-01-14 DIAGNOSIS — G4719 Other hypersomnia: Secondary | ICD-10-CM

## 2023-01-14 DIAGNOSIS — F439 Reaction to severe stress, unspecified: Secondary | ICD-10-CM

## 2023-01-14 DIAGNOSIS — G4733 Obstructive sleep apnea (adult) (pediatric): Secondary | ICD-10-CM

## 2023-01-14 DIAGNOSIS — E669 Obesity, unspecified: Secondary | ICD-10-CM

## 2023-01-14 LAB — IGG, IGA, IGM
IgA: 82 mg/dL — ABNORMAL LOW (ref 87–352)
IgG (Immunoglobin G), Serum: 2574 mg/dL — ABNORMAL HIGH (ref 586–1602)
IgM (Immunoglobulin M), Srm: 30 mg/dL (ref 26–217)

## 2023-01-15 NOTE — Procedures (Signed)
[facility]  HOME SLEEP TEST (Watch PAT) REPORT  STUDY DATE: 01/14/2023  DOB: [birth year]  MRN: [id number]  ORDERING CLINICIAN: Huston Foley, MD, PhD   REFERRING CLINICIAN: Dr. Rachel Moulds  CLINICAL INFORMATION/HISTORY: 47 year old female with an underlying medical history of anemia, multiple myeloma on chemotherapy, left ventricular hypertrophy, hypertension, sleep apnea, and obesity, who reports snoring and nocturia. She was diagnosed several years ago with sleep apnea with a sleep study but did not pursue CPAP therapy at the time.   Epworth sleepiness score: 16/24.  BMI: 34.4 kg/m  FINDINGS:   Sleep Summary:   Total Recording Time (hours, min): 7 hours, 29 min  Total Sleep Time (hours, min):  6 hours, 52 min  Percent REM (%):    12.5%   Respiratory Indices:   Calculated pAHI (per hour):  86.6/hour         REM pAHI:    74.1/hour       NREM pAHI: 88.4/hour  Central pAHI: 17.9/hour  Oxygen Saturation Statistics:    Oxygen Saturation (%) Mean: 93%   Minimum oxygen saturation (%):                 82%   O2 Saturation Range (%): 82 - 99%    O2 Saturation (minutes) <=88%: 13.4 min  Pulse Rate Statistics:   Pulse Mean (bpm):    79/min    Pulse Range (58 - 106/min)   IMPRESSION: OSA (obstructive sleep apnea), severe Central sleep apnea  RECOMMENDATION:  This home sleep test demonstrates severe obstructive sleep apnea with a total AHI of 86.6/hour and O2 nadir of 82%.  Snoring was detected, in the moderate range fairly consistently throughout the night, at times louder, at times milder.  There was a moderate central apnea component as well.  Treatment with positive airway pressure is highly recommended. The patient will be advised to proceed with an autoPAP titration/trial at home. A laboratory attended titration study can be considered in the future for optimization of treatment settings and to improve tolerance and compliance. Alternative  treatment options are limited secondary to the severity of the patient's sleep disordered breathing, but may include surgical treatment with an implantable hypoglossal nerve stimulator (in carefully selected candidates, meeting criteria).  Concomitant weight loss is recommended, where clinically appropriate. Please note, that untreated obstructive sleep apnea may carry additional perioperative morbidity. Patients with significant obstructive sleep apnea should receive perioperative PAP therapy and the surgeons and particularly the anesthesiologist should be informed of the diagnosis and the severity of the sleep disordered breathing. The patient should be cautioned not to drive, work at heights, or operate dangerous or heavy equipment when tired or sleepy. Review and reiteration of good sleep hygiene measures should be pursued with any patient. Other causes of the patient's symptoms, including circadian rhythm disturbances, an underlying mood disorder, medication effect and/or an underlying medical problem cannot be ruled out based on this test. Clinical correlation is recommended.  The patient and her referring provider will be notified of the test results. The patient will be seen in follow up in sleep clinic at [facility].  I certify that I have reviewed the raw data recording prior to the issuance of this report in accordance with the standards of the American Academy of Sleep Medicine (AASM).  INTERPRETING PHYSICIAN:   Huston Foley, MD, PhD Medical Director, [facility]) Diplomat, ABPN (Neurology and Sleep)   [facility] [address] [place], [place] [address] [phone]  [phone]              

## 2023-01-15 NOTE — Addendum Note (Signed)
Addended by: Huston Foley on: 01/15/2023 06:09 PM   Modules accepted: Orders

## 2023-01-15 NOTE — Progress Notes (Signed)
See procedure note.

## 2023-01-17 ENCOUNTER — Other Ambulatory Visit: Payer: Self-pay

## 2023-01-17 ENCOUNTER — Inpatient Hospital Stay: Payer: 59

## 2023-01-17 VITALS — BP 131/93 | HR 95 | Temp 98.7°F | Resp 18 | Wt 243.0 lb

## 2023-01-17 DIAGNOSIS — Z5112 Encounter for antineoplastic immunotherapy: Secondary | ICD-10-CM | POA: Diagnosis not present

## 2023-01-17 DIAGNOSIS — C9000 Multiple myeloma not having achieved remission: Secondary | ICD-10-CM

## 2023-01-17 DIAGNOSIS — D472 Monoclonal gammopathy: Secondary | ICD-10-CM

## 2023-01-17 MED ORDER — BORTEZOMIB CHEMO SQ INJECTION 3.5 MG (2.5MG/ML)
1.3000 mg/m2 | Freq: Once | INTRAMUSCULAR | Status: AC
  Administered 2023-01-17: 2.75 mg via SUBCUTANEOUS
  Filled 2023-01-17: qty 1.1

## 2023-01-17 MED ORDER — PROCHLORPERAZINE MALEATE 10 MG PO TABS
10.0000 mg | ORAL_TABLET | Freq: Once | ORAL | Status: AC
  Administered 2023-01-17: 10 mg via ORAL
  Filled 2023-01-17: qty 1

## 2023-01-17 NOTE — Patient Instructions (Signed)
[place] [facility]   Discharge Instructions: Thank you for choosing [facility] to provide your oncology and hematology care.   If you have a lab appointment with the Cancer Center, please go directly to the Cancer Center and check in at the registration area.   Wear comfortable clothing and clothing appropriate for easy access to any Portacath or PICC line.   We strive to give you quality time with your provider. You may need to reschedule your appointment if you arrive late (15 or more minutes).  Arriving late affects you and other patients whose appointments are after yours.  Also, if you miss three or more appointments without notifying the office, you may be dismissed from the clinic at the provider's discretion.      For prescription refill requests, have your pharmacy contact our office and allow 72 hours for refills to be completed.    Today you received the following chemotherapy and/or immunotherapy agents: Bortezomib (Velcade)      To help prevent nausea and vomiting after your treatment, we encourage you to take your nausea medication as directed.  BELOW ARE SYMPTOMS THAT SHOULD BE REPORTED IMMEDIATELY: *FEVER GREATER THAN 100.4 F (38 C) OR HIGHER *CHILLS OR SWEATING *NAUSEA AND VOMITING THAT IS NOT CONTROLLED WITH YOUR NAUSEA MEDICATION *UNUSUAL SHORTNESS OF BREATH *UNUSUAL BRUISING OR BLEEDING *URINARY PROBLEMS (pain or burning when urinating, or frequent urination) *BOWEL PROBLEMS (unusual diarrhea, constipation, pain near the anus) TENDERNESS IN MOUTH AND THROAT WITH OR WITHOUT PRESENCE OF ULCERS (sore throat, sores in mouth, or a toothache) UNUSUAL RASH, SWELLING OR PAIN  UNUSUAL VAGINAL DISCHARGE OR ITCHING   Items with * indicate a potential emergency and should be followed up as soon as possible or go to the Emergency Department if any problems should occur.  Please show the CHEMOTHERAPY ALERT CARD or IMMUNOTHERAPY ALERT  CARD at check-in to the Emergency Department and triage nurse.  Should you have questions after your visit or need to cancel or reschedule your appointment, please contact [facility] [facility]  Dept: [phone]  and follow the prompts.  Office hours are 8:00 a.m. to 4:30 p.m. Monday - Friday. Please note that voicemails left after 4:00 p.m. may not be returned until the following business day.  We are closed weekends and major holidays. You have access to a nurse at all times for urgent questions. Please call the main number to the clinic Dept: [phone] and follow the prompts.   For any non-urgent questions, you may also contact your provider using MyChart. We now offer e-Visits for anyone 18 and older to request care online for non-urgent symptoms. For details visit [email or web address].   Also download the MyChart app! Go to the app store, search "MyChart", open the app, select [facility], and log in with your MyChart username and password.   

## 2023-01-19 ENCOUNTER — Encounter: Payer: Self-pay | Admitting: Hematology and Oncology

## 2023-01-20 ENCOUNTER — Other Ambulatory Visit: Payer: 59

## 2023-01-20 ENCOUNTER — Inpatient Hospital Stay (HOSPITAL_BASED_OUTPATIENT_CLINIC_OR_DEPARTMENT_OTHER): Payer: 59 | Admitting: Adult Health

## 2023-01-20 ENCOUNTER — Inpatient Hospital Stay: Payer: 59

## 2023-01-20 ENCOUNTER — Encounter: Payer: Self-pay | Admitting: Adult Health

## 2023-01-20 ENCOUNTER — Other Ambulatory Visit: Payer: Self-pay

## 2023-01-20 DIAGNOSIS — C9000 Multiple myeloma not having achieved remission: Secondary | ICD-10-CM | POA: Diagnosis not present

## 2023-01-20 DIAGNOSIS — D472 Monoclonal gammopathy: Secondary | ICD-10-CM | POA: Diagnosis not present

## 2023-01-20 DIAGNOSIS — Z95828 Presence of other vascular implants and grafts: Secondary | ICD-10-CM

## 2023-01-20 DIAGNOSIS — Z5112 Encounter for antineoplastic immunotherapy: Secondary | ICD-10-CM | POA: Diagnosis not present

## 2023-01-20 LAB — CBC WITH DIFFERENTIAL (CANCER CENTER ONLY)
Abs Immature Granulocytes: 0.12 K/uL — ABNORMAL HIGH (ref 0.00–0.07)
Basophils Absolute: 0 K/uL (ref 0.0–0.1)
Basophils Relative: 0 %
Eosinophils Absolute: 0 K/uL (ref 0.0–0.5)
Eosinophils Relative: 0 %
HCT: 32 % — ABNORMAL LOW (ref 36.0–46.0)
Hemoglobin: 10.1 g/dL — ABNORMAL LOW (ref 12.0–15.0)
Immature Granulocytes: 1 %
Lymphocytes Relative: 24 %
Lymphs Abs: 2.7 K/uL (ref 0.7–4.0)
MCH: 29.8 pg (ref 26.0–34.0)
MCHC: 31.6 g/dL (ref 30.0–36.0)
MCV: 94.4 fL (ref 80.0–100.0)
Monocytes Absolute: 1.8 K/uL — ABNORMAL HIGH (ref 0.1–1.0)
Monocytes Relative: 16 %
Neutro Abs: 6.7 K/uL (ref 1.7–7.7)
Neutrophils Relative %: 59 %
Platelet Count: 245 K/uL (ref 150–400)
RBC: 3.39 MIL/uL — ABNORMAL LOW (ref 3.87–5.11)
RDW: 15.9 % — ABNORMAL HIGH (ref 11.5–15.5)
WBC Count: 11.4 K/uL — ABNORMAL HIGH (ref 4.0–10.5)
nRBC: 0 % (ref 0.0–0.2)

## 2023-01-20 LAB — CMP (CANCER CENTER ONLY)
ALT: 15 U/L (ref 0–44)
AST: 13 U/L — ABNORMAL LOW (ref 15–41)
Albumin: 3.2 g/dL — ABNORMAL LOW (ref 3.5–5.0)
Alkaline Phosphatase: 45 U/L (ref 38–126)
Anion gap: 5 (ref 5–15)
BUN: 17 mg/dL (ref 6–20)
CO2: 28 mmol/L (ref 22–32)
Calcium: 8.5 mg/dL — ABNORMAL LOW (ref 8.9–10.3)
Chloride: 105 mmol/L (ref 98–111)
Creatinine: 0.73 mg/dL (ref 0.44–1.00)
GFR, Estimated: >60 mL/min (ref >60)
Glucose, Bld: 97 mg/dL (ref 70–99)
Potassium: 3.6 mmol/L (ref 3.5–5.1)
Sodium: 138 mmol/L (ref 135–145)
Total Bilirubin: 0.3 mg/dL (ref 0.3–1.2)
Total Protein: 7.3 g/dL (ref 6.5–8.1)

## 2023-01-20 MED ORDER — SODIUM CHLORIDE 0.9% FLUSH
10.0000 mL | Freq: Once | INTRAVENOUS | Status: AC
  Administered 2023-01-20: 10 mL

## 2023-01-20 MED ORDER — BORTEZOMIB CHEMO SQ INJECTION 3.5 MG (2.5MG/ML)
1.3000 mg/m2 | Freq: Once | INTRAMUSCULAR | Status: AC
  Administered 2023-01-20: 2.75 mg via SUBCUTANEOUS
  Filled 2023-01-20: qty 1.1

## 2023-01-20 MED ORDER — DEXAMETHASONE 4 MG PO TABS
20.0000 mg | ORAL_TABLET | Freq: Once | ORAL | Status: AC
  Administered 2023-01-20: 20 mg via ORAL
  Filled 2023-01-20: qty 5

## 2023-01-20 MED ORDER — HEPARIN SOD (PORK) LOCK FLUSH 100 UNIT/ML IV SOLN
500.0000 [IU] | Freq: Once | INTRAVENOUS | Status: AC
  Administered 2023-01-20: 500 [IU]

## 2023-01-20 MED ORDER — PROCHLORPERAZINE MALEATE 10 MG PO TABS
10.0000 mg | ORAL_TABLET | Freq: Once | ORAL | Status: AC
  Administered 2023-01-20: 10 mg via ORAL
  Filled 2023-01-20: qty 1

## 2023-01-20 NOTE — Progress Notes (Signed)
[facility] Cancer Follow up:    Kara Saint, MD [address] [place] [address]   DIAGNOSIS: Multiple Myeloma  SUMMARY OF ONCOLOGIC HISTORY: Oncology History  Multiple myeloma (HCC)  09/12/2022 Initial Diagnosis   Multiple myeloma (HCC)   09/20/2022 - 10/04/2022 Chemotherapy   Patient is on Treatment Plan : MYELOMA NEWLY DIAGNOSED TRANSPLANT CANDIDATE DaraVRd (Daratumumab IV) q21d x 6 Cycles (Induction/Consolidation)     10/11/2022 - 10/22/2022 Chemotherapy   Patient is on Treatment Plan : MYELOMA NON-TRANSPLANT CANDIDATES VRd SQ q21d     11/04/2022 -  Chemotherapy   Patient is on Treatment Plan : MYELOMA NEWLY DIAGNOSED TRANSPLANT CANDIDATE DaraVRd (Daratumumab IV) q21d x 6 Cycles (Induction/Consolidation)       CURRENT THERAPY: DaraVRD  INTERVAL HISTORY: Kara Medina 47 y.o. female returns for f/u and evaluation prior to receiving treatment for her multiple myeloma.  She is doing well.  She continues to have mild fatigue which she manages with energy conservation.  She is interested in knowing what her calcium level is since last week.  She increased her Tums to 2 tablets 3 times a day.  Otherwise she is feeling well and has no questions or concerns today.   Patient Active Problem List   Diagnosis Date Noted   Port-A-Cath in place 01/06/2023   HTN (hypertension) 11/14/2022   Multiple myeloma (HCC) 09/12/2022   MGUS (monoclonal gammopathy of unknown significance) 09/10/2022   Hypertensive urgency 08/29/2022   Iron deficiency anemia 08/29/2022   LVH (left ventricular hypertrophy) 08/29/2022   Normocytic anemia 08/29/2022    is allergic to amoxicillin.  MEDICAL HISTORY: Past Medical History:  Diagnosis Date   Anemia    Gestational diabetes    diet controlled   Postpartum care following vaginal delivery (7/27) 02/09/2016   Pregnancy induced hypertension    Trigeminal neuralgia    2012 corrected with surgery    SURGICAL HISTORY: Past  Surgical History:  Procedure Laterality Date   APPENDECTOMY  2008   IR IMAGING GUIDED PORT INSERTION  12/03/2022   TRIGEMINAL NERVE DECOMPRESSION  2012    SOCIAL HISTORY: Social History   Socioeconomic History   Marital status: Married    Spouse name: Not on file   Number of children: Not on file   Years of education: Not on file   Highest education level: Bachelor's degree (e.g., BA, AB, BS)  Occupational History   Not on file  Tobacco Use   Smoking status: Never   Smokeless tobacco: Never  Vaping Use   Vaping Use: Never used  Substance and Sexual Activity   Alcohol use: No   Drug use: No   Sexual activity: Yes  Other Topics Concern   Not on file  Social History Narrative   Not on file   Social Determinants of Health   Financial Resource Strain: Low Risk  (08/23/2022)   Overall Financial Resource Strain (CARDIA)    Difficulty of Paying Living Expenses: Not hard at all  Food Insecurity: No Food Insecurity (08/23/2022)   Hunger Vital Sign    Worried About Running Out of Food in the Last Year: Never true    Ran Out of Food in the Last Year: Never true  Transportation Needs: No Transportation Needs (08/23/2022)   PRAPARE - Administrator, Civil Service (Medical): No    Lack of Transportation (Non-Medical): No  Physical Activity: Insufficiently Active (08/23/2022)   Exercise Vital Sign    Days of Exercise per  Week: 2 days    Minutes of Exercise per Session: 30 min  Stress: Stress Concern Present (08/23/2022)   [employer] of Occupational Health - Occupational Stress Questionnaire    Feeling of Stress : To some extent  Social Connections: Unknown (08/23/2022)   Social Connection and Isolation Panel [NHANES]    Frequency of Communication with Friends and Family: More than three times a week    Frequency of Social Gatherings with Friends and Family: Once a week    Attends Religious Services: Patient declined    Database administrator or Organizations: Yes     Attends Engineer, structural: More than 4 times per year    Marital Status: Married  Catering manager Violence: Not on file    FAMILY HISTORY: Family History  Problem Relation Age of Onset   Cancer Other    Hypertension Other    Healthy Father    Cancer Paternal Grandfather        stomach, lung   Hypertension Mother    Breast cancer Mother 91   Hypertension Maternal Grandmother    Arthritis Maternal Grandmother    Hypertension Maternal Grandfather    Other Brother        ?prediabetic   Diabetes Paternal Grandmother    Hypertension Paternal Grandmother    Autism Son     Review of Systems  Constitutional:  Positive for fatigue. Negative for appetite change, chills, fever and unexpected weight change.  HENT:   Negative for hearing loss, lump/mass and trouble swallowing.   Eyes:  Negative for eye problems and icterus.  Respiratory:  Negative for chest tightness, cough and shortness of breath.   Cardiovascular:  Negative for chest pain, leg swelling and palpitations.  Gastrointestinal:  Negative for abdominal distention, abdominal pain, constipation, diarrhea, nausea and vomiting.  Endocrine: Negative for hot flashes.  Genitourinary:  Negative for difficulty urinating.   Musculoskeletal:  Negative for arthralgias.  Skin:  Negative for itching and rash.  Neurological:  Negative for dizziness, extremity weakness, headaches and numbness.  Hematological:  Negative for adenopathy. Does not bruise/bleed easily.  Psychiatric/Behavioral:  Negative for depression. The patient is not nervous/anxious.       PHYSICAL EXAMINATION    Vitals:   01/20/23 0830  BP: (!) 140/91  Pulse: 84  Resp: 17  Temp: 97.7 F (36.5 C)  SpO2: 100%    Physical Exam Constitutional:      General: She is not in acute distress.    Appearance: Normal appearance. She is not toxic-appearing.  HENT:     Head: Normocephalic and atraumatic.     Mouth/Throat:     Mouth: Mucous membranes are  moist.     Pharynx: Oropharynx is clear. No oropharyngeal exudate or posterior oropharyngeal erythema.  Eyes:     General: No scleral icterus. Cardiovascular:     Rate and Rhythm: Normal rate and regular rhythm.     Pulses: Normal pulses.     Heart sounds: Normal heart sounds.  Pulmonary:     Effort: Pulmonary effort is normal.     Breath sounds: Normal breath sounds.  Abdominal:     General: Abdomen is flat. Bowel sounds are normal. There is no distension.     Palpations: Abdomen is soft.     Tenderness: There is no abdominal tenderness.  Musculoskeletal:        General: No swelling.     Cervical back: Neck supple.  Lymphadenopathy:     Cervical: No cervical adenopathy.  Skin:    General: Skin is warm and dry.     Findings: No rash.  Neurological:     General: No focal deficit present.     Mental Status: She is alert.  Psychiatric:        Mood and Affect: Mood normal.        Behavior: Behavior normal.     LABORATORY DATA:  CBC    Component Value Date/Time   WBC 11.4 (H) 01/20/2023 0810   WBC 7.9 12/23/2022 0905   RBC 3.39 (L) 01/20/2023 0810   HGB 10.1 (L) 01/20/2023 0810   HCT 32.0 (L) 01/20/2023 0810   HCT 24.4 (L) 09/04/2022 1037   PLT 245 01/20/2023 0810   MCV 94.4 01/20/2023 0810   MCH 29.8 01/20/2023 0810   MCHC 31.6 01/20/2023 0810   RDW 15.9 (H) 01/20/2023 0810   LYMPHSABS 2.7 01/20/2023 0810   MONOABS 1.8 (H) 01/20/2023 0810   EOSABS 0.0 01/20/2023 0810   BASOSABS 0.0 01/20/2023 0810    CMP     Component Value Date/Time   NA 138 01/13/2023 1006   K 3.5 01/13/2023 1006   CL 106 01/13/2023 1006   CO2 29 01/13/2023 1006   GLUCOSE 78 01/13/2023 1006   BUN 18 01/13/2023 1006   CREATININE 0.61 01/13/2023 1006   CREATININE 0.95 08/23/2022 1631   CALCIUM 8.1 (L) 01/13/2023 1006   PROT 7.5 01/13/2023 1006   ALBUMIN 3.3 (L) 01/13/2023 1006   AST 14 (L) 01/13/2023 1006   ALT 16 01/13/2023 1006   ALKPHOS 40 01/13/2023 1006   BILITOT 0.5 01/13/2023  1006   GFRNONAA >60 01/13/2023 1006   GFRAA >60 02/09/2016 0525         ASSESSMENT and THERAPY PLAN:   Multiple myeloma (HCC) Kara Medina is a 47 year old woman with multiple myeloma here today for f/u and evaluation prior to receiving Cycle 4 day 8 of DaraVrd.    Multiple Myeloma: Her labs have improved and she is tolerating treatment well.  She will proceed with Velcade only today.  I reviewed this with Kara Medina CPP, Dr. Al Pimple, and our pharmacy upstairs to verify that moving forward she will receive daratumumab on day 1 of each cycle only. Fatigue: Managed with energy conservation which I recommended that she continue. Hypokalemia: She is eating a potassium rich diet.  Her potassium level is pending today. Hypocalcemia: She is taking Tums 2 tablets 3 times a day.  Her current calcium level is pending.  She will return as scheduled for treatment, labs and follow-up.    All questions were answered. The patient knows to call the clinic with any problems, questions or concerns. We can certainly see the patient much sooner if necessary.  Total encounter time:30 minutes*in face-to-face visit time, chart review, lab review, care coordination, order entry, and documentation of the encounter time.    Lillard Anes, NP 01/20/23 9:16 AM Medical Oncology and Hematology [facility] [address] [place], [place] [address] Tel. [phone]    Fax. [phone]  *Total Encounter Time as defined by the Centers for Medicare and Medicaid Services includes, in addition to the face-to-face time of a patient visit (documented in the note above) non-face-to-face time: obtaining and reviewing outside history, ordering and reviewing medications, tests or procedures, care coordination (communications with other health care professionals or caregivers) and documentation in the medical record.

## 2023-01-20 NOTE — Patient Instructions (Signed)
[place]   Discharge Instructions: Thank you for choosing [facility] to provide your oncology and hematology care.   If you have a lab appointment with the Cancer Center, please go directly to the Cancer Center and check in at the registration area.   Wear comfortable clothing and clothing appropriate for easy access to any Portacath or PICC line.   We strive to give you quality time with your provider. You may need to reschedule your appointment if you arrive late (15 or more minutes).  Arriving late affects you and other patients whose appointments are after yours.  Also, if you miss three or more appointments without notifying the office, you may be dismissed from the clinic at the provider's discretion.      For prescription refill requests, have your pharmacy contact our office and allow 72 hours for refills to be completed.    Today you received the following chemotherapy and/or immunotherapy agents: Bortezomib (Velcade)      To help prevent nausea and vomiting after your treatment, we encourage you to take your nausea medication as directed.  BELOW ARE SYMPTOMS THAT SHOULD BE REPORTED IMMEDIATELY: *FEVER GREATER THAN 100.4 F (38 C) OR HIGHER *CHILLS OR SWEATING *NAUSEA AND VOMITING THAT IS NOT CONTROLLED WITH YOUR NAUSEA MEDICATION *UNUSUAL SHORTNESS OF BREATH *UNUSUAL BRUISING OR BLEEDING *URINARY PROBLEMS (pain or burning when urinating, or frequent urination) *BOWEL PROBLEMS (unusual diarrhea, constipation, pain near the anus) TENDERNESS IN MOUTH AND THROAT WITH OR WITHOUT PRESENCE OF ULCERS (sore throat, sores in mouth, or a toothache) UNUSUAL RASH, SWELLING OR PAIN  UNUSUAL VAGINAL DISCHARGE OR ITCHING   Items with * indicate a potential emergency and should be followed up as soon as possible or go to the Emergency Department if any problems should occur.  Please show the CHEMOTHERAPY ALERT CARD or IMMUNOTHERAPY ALERT  CARD at check-in to the Emergency Department and triage nurse.  Should you have questions after your visit or need to cancel or reschedule your appointment, please contact [facility]  Dept: [phone]  and follow the prompts.  Office hours are 8:00 a.m. to 4:30 p.m. Monday - Friday. Please note that voicemails left after 4:00 p.m. may not be returned until the following business day.  We are closed weekends and major holidays. You have access to a nurse at all times for urgent questions. Please call the main number to the clinic Dept: [phone] and follow the prompts.   For any non-urgent questions, you may also contact your provider using MyChart. We now offer e-Visits for anyone 18 and older to request care online for non-urgent symptoms. For details visit [email or web address].   Also download the MyChart app! Go to the app store, search "MyChart", open the app, select [facility], and log in with your MyChart username and password.   

## 2023-01-20 NOTE — Assessment & Plan Note (Signed)
Kara Medina is a 47 year old woman with multiple myeloma here today for f/u and evaluation prior to receiving Cycle 4 day 8 of DaraVrd.    Multiple Myeloma: Her labs have improved and she is tolerating treatment well.  She will proceed with Velcade only today.  I reviewed this with Marylynn Pearson CPP, Dr. Al Pimple, and our pharmacy upstairs to verify that moving forward she will receive daratumumab on day 1 of each cycle only. Fatigue: Managed with energy conservation which I recommended that she continue. Hypokalemia: She is eating a potassium rich diet.  Her potassium level is pending today. Hypocalcemia: She is taking Tums 2 tablets 3 times a day.  Her current calcium level is pending.  She will return as scheduled for treatment, labs and follow-up.

## 2023-01-21 ENCOUNTER — Telehealth: Payer: Self-pay | Admitting: *Deleted

## 2023-01-21 NOTE — Telephone Encounter (Signed)
Notified of message below

## 2023-01-21 NOTE — Telephone Encounter (Signed)
-----   Message from Loa Socks, NP sent at 01/20/2023  5:00 PM EDT ----- Please tell patient that her calcium level is improved and she should continue on the Tums as we previously discussed for another week then she can go back to twice a day. ----- Message ----- From: Leory Plowman, Lab In [place] Sent: 01/20/2023   8:31 AM EDT To: Rachel Moulds, MD

## 2023-01-23 ENCOUNTER — Other Ambulatory Visit: Payer: Self-pay

## 2023-01-23 ENCOUNTER — Inpatient Hospital Stay: Payer: 59

## 2023-01-23 VITALS — BP 126/75 | HR 91 | Resp 18 | Wt 242.0 lb

## 2023-01-23 DIAGNOSIS — D472 Monoclonal gammopathy: Secondary | ICD-10-CM

## 2023-01-23 DIAGNOSIS — Z5112 Encounter for antineoplastic immunotherapy: Secondary | ICD-10-CM | POA: Diagnosis not present

## 2023-01-23 DIAGNOSIS — C9000 Multiple myeloma not having achieved remission: Secondary | ICD-10-CM

## 2023-01-23 MED ORDER — PROCHLORPERAZINE MALEATE 10 MG PO TABS
10.0000 mg | ORAL_TABLET | Freq: Once | ORAL | Status: AC
  Administered 2023-01-23: 10 mg via ORAL
  Filled 2023-01-23: qty 1

## 2023-01-23 MED ORDER — BORTEZOMIB CHEMO SQ INJECTION 3.5 MG (2.5MG/ML)
1.3000 mg/m2 | Freq: Once | INTRAMUSCULAR | Status: AC
  Administered 2023-01-23: 2.75 mg via SUBCUTANEOUS
  Filled 2023-01-23: qty 1.1

## 2023-01-23 NOTE — Patient Instructions (Signed)
[facility]   Discharge Instructions: Thank you for choosing [facility] to provide your oncology and hematology care.   If you have a lab appointment with the Cancer Center, please go directly to the Cancer Center and check in at the registration area.   Wear comfortable clothing and clothing appropriate for easy access to any Portacath or PICC line.   We strive to give you quality time with your provider. You may need to reschedule your appointment if you arrive late (15 or more minutes).  Arriving late affects you and other patients whose appointments are after yours.  Also, if you miss three or more appointments without notifying the office, you may be dismissed from the clinic at the provider's discretion.      For prescription refill requests, have your pharmacy contact our office and allow 72 hours for refills to be completed.    Today you received the following chemotherapy and/or immunotherapy agents: Bortezomib (Velcade)      To help prevent nausea and vomiting after your treatment, we encourage you to take your nausea medication as directed.  BELOW ARE SYMPTOMS THAT SHOULD BE REPORTED IMMEDIATELY: *FEVER GREATER THAN 100.4 F (38 C) OR HIGHER *CHILLS OR SWEATING *NAUSEA AND VOMITING THAT IS NOT CONTROLLED WITH YOUR NAUSEA MEDICATION *UNUSUAL SHORTNESS OF BREATH *UNUSUAL BRUISING OR BLEEDING *URINARY PROBLEMS (pain or burning when urinating, or frequent urination) *BOWEL PROBLEMS (unusual diarrhea, constipation, pain near the anus) TENDERNESS IN MOUTH AND THROAT WITH OR WITHOUT PRESENCE OF ULCERS (sore throat, sores in mouth, or a toothache) UNUSUAL RASH, SWELLING OR PAIN  UNUSUAL VAGINAL DISCHARGE OR ITCHING   Items with * indicate a potential emergency and should be followed up as soon as possible or go to the Emergency Department if any problems should occur.  Please show the CHEMOTHERAPY ALERT CARD or IMMUNOTHERAPY ALERT  CARD at check-in to the Emergency Department and triage nurse.  Should you have questions after your visit or need to cancel or reschedule your appointment, please contact [facility]  Dept: [phone]  and follow the prompts.  Office hours are 8:00 a.m. to 4:30 p.m. Monday - Friday. Please note that voicemails left after 4:00 p.m. may not be returned until the following business day.  We are closed weekends and major holidays. You have access to a nurse at all times for urgent questions. Please call the main number to the clinic Dept: [phone] and follow the prompts.   For any non-urgent questions, you may also contact your provider using MyChart. We now offer e-Visits for anyone 18 and older to request care online for non-urgent symptoms. For details visit [email or web address].   Also download the MyChart app! Go to the app store, search "MyChart", open the app, select [facility], and log in with your MyChart username and password.   

## 2023-01-28 NOTE — Telephone Encounter (Signed)
Zott, Stacy  Avraham Benish, Abbe Amsterdam, CMA; Melvern Sample; Zott, Stacy Got it Thank you       Previous Messages    ----- Message ----- From: Bobbye Morton, CMA Sent: 01/27/2023   5:07 PM EDT To: Melvern Sample; Kennyth Arnold Zott Subject: autopap                                        New orders have been placed for the above pt, DOB: [birth year] Thank

## 2023-01-29 NOTE — Progress Notes (Signed)
Cardiology Office Note:   Date:  01/30/2023  ID:  Kara Medina, DOB May 21, 1976, MRN [id number] PCP: [name], MD  [facility] Providers Cardiologist:  Christell Constant, MD    History of Present Illness:   Kara Medina is a 47 y.o. female with history of multiple myeloma, gestational hypertension and gestational diabetes who was referred to [facility] earlier this year for evaluation of hypertension urgency by Dr. Salomon Fick.   Patient saw Dr. Izora Ribas in February and patient reported generally feeling poorly. She endorsed palpitations and fatigue. Patient was placed on Amlodipine 5mg  in addition to Metoprolol Tartrate 25mg  BID. She was seen by cardiology pharm D in March and switched to Labetalol 200mg  BID with Metoprolol stopped. At pharm D follow up in May, BP was much improved and this regimen was continued.   Patient also noted with anemia prior to her cardiology visit and was referred to hematology by her PCP. Labs were concerning for multiple myeloma and patient underwent bone marrow biopsy which confirmed IgG kappa myeloma. Patient was started on daratumumab with velcade and dexamethasone which was then scaled back to VRD without daratumumab. Patient was also referred to [facility] for evaluation for high-dose chemotherapy and autologous stem cell transplant for recent identified IgG kappa myeloma. Despite the new diagnosis and significant OV burden for treatments, patient is staying positive and overall feeling well. Patient does report some weight gain since MM diagnosis.   She has had limited time to get exercise with cancer treatment schedules but tolerates moderate exertion without issue. She recently moved into a new house with a second story and has some brief shortness of breath with faster HR after climbing them. Also reports some intermittent swelling of feet, notices that this is worse on days with higher salt intake.   Today patient denies chest  pain, palpitations, melena, hematuria, hemoptysis, diaphoresis, weakness, presyncope, syncope, orthopnea, and PND.  Studies Reviewed:    EKG:        No study today  Risk Assessment/Calculations:              Physical Exam:   VS:  BP 120/80   Pulse 87   Ht 5\' 6"  (1.676 m)   Wt 250 lb 6.4 oz (113.6 kg)   SpO2 98%   BMI 40.42 kg/m    Wt Readings from Last 3 Encounters:  01/30/23 250 lb 6.4 oz (113.6 kg)  01/23/23 242 lb (109.8 kg)  01/20/23 242 lb 3.2 oz (109.9 kg)     Physical Exam Constitutional:      Appearance: Normal appearance.  HENT:     Head: Normocephalic.  Eyes:     Pupils: Pupils are equal, round, and reactive to light.  Cardiovascular:     Rate and Rhythm: Normal rate and regular rhythm.     Pulses: Normal pulses.     Heart sounds: Normal heart sounds.  Pulmonary:     Effort: Pulmonary effort is normal.     Breath sounds: Normal breath sounds.  Abdominal:     General: Abdomen is flat.     Palpations: Abdomen is soft.  Musculoskeletal:        General: Normal range of motion.     Right lower leg: No edema.     Left lower leg: No edema.  Skin:    General: Skin is warm and dry.     Capillary Refill: Capillary refill takes less than 2 seconds.  Neurological:     General:  No focal deficit present.     Mental Status: She is alert and oriented to person, place, and time.  Psychiatric:        Mood and Affect: Mood normal.        Behavior: Behavior normal.        Thought Content: Thought content normal.        Judgment: Judgment normal.      ASSESSMENT AND PLAN:    Hypertension  Patient's blood pressure is excellent today. Given the stress of her multiple myeloma diagnosis/treatment, I am quite pleased with overall BP management. Will continue Labetalol 200mg  BID and Amlodipine 5mg . Labs from oncology reviewed, no need for repeat today. Reviewed importance of low salt diet with regards to hypertension management.  OSA  Since last cardiology  office visit, patient diagnosed with severe obstructive sleep apnea and CPAP ordered. She has not yet received her device but is looking forward to more restful sleep with use.  Family hx hyperlipidemia  Patient reports multiple family members with high cholesterol. No lipid panel this year, last found to be 60 as of 08/22/21. Will check LDL direct today. Discussed with patient that goal LDL for her is less than 100.   Multiple myeloma  Patient undergoing treatment, has transplant tentatively planned for September.          Signed, Perlie Gold, PA-C

## 2023-01-30 ENCOUNTER — Encounter: Payer: Self-pay | Admitting: Cardiology

## 2023-01-30 ENCOUNTER — Ambulatory Visit: Payer: 59 | Attending: Nurse Practitioner | Admitting: Cardiology

## 2023-01-30 VITALS — BP 120/80 | HR 87 | Ht 66.0 in | Wt 250.4 lb

## 2023-01-30 DIAGNOSIS — I10 Essential (primary) hypertension: Secondary | ICD-10-CM

## 2023-01-30 DIAGNOSIS — G4733 Obstructive sleep apnea (adult) (pediatric): Secondary | ICD-10-CM | POA: Diagnosis not present

## 2023-01-30 DIAGNOSIS — Z83438 Family history of other disorder of lipoprotein metabolism and other lipidemia: Secondary | ICD-10-CM | POA: Diagnosis not present

## 2023-01-30 NOTE — Patient Instructions (Signed)
Medication Instructions:  Your physician recommends that you continue on your current medications as directed. Please refer to the Current Medication list given to you today.   *If you need a refill on your cardiac medications before your next appointment, please call your pharmacy*   Lab Work: LDL direct  If you have labs (blood work) drawn today and your tests are completely normal, you will receive your results only by: MyChart Message (if you have MyChart) OR A paper copy in the mail If you have any lab test that is abnormal or we need to change your treatment, we will call you to review the results.    Follow-Up: At [facility], you and your health needs are our priority.  As part of our continuing mission to provide you with exceptional heart care, we have created designated Provider Care Teams.  These Care Teams include your primary Cardiologist (physician) and Advanced Practice Providers (APPs -  Physician Assistants and Nurse Practitioners) who all work together to provide you with the care you need, when you need it.   Your next appointment:   1 year(s)  Provider:   Perlie Gold, PA-C

## 2023-01-31 LAB — LDL CHOLESTEROL, DIRECT: LDL Direct: 76 mg/dL (ref 0–99)

## 2023-01-31 MED FILL — Dexamethasone Sodium Phosphate Inj 100 MG/10ML: INTRAMUSCULAR | Qty: 2 | Status: AC

## 2023-02-03 ENCOUNTER — Other Ambulatory Visit: Payer: Self-pay | Admitting: Hematology and Oncology

## 2023-02-03 ENCOUNTER — Inpatient Hospital Stay: Payer: 59

## 2023-02-03 ENCOUNTER — Other Ambulatory Visit: Payer: Self-pay

## 2023-02-03 ENCOUNTER — Inpatient Hospital Stay (HOSPITAL_BASED_OUTPATIENT_CLINIC_OR_DEPARTMENT_OTHER): Payer: 59 | Admitting: Hematology and Oncology

## 2023-02-03 ENCOUNTER — Other Ambulatory Visit: Payer: Self-pay | Admitting: *Deleted

## 2023-02-03 ENCOUNTER — Encounter: Payer: Self-pay | Admitting: Hematology and Oncology

## 2023-02-03 VITALS — BP 126/78 | HR 92 | Temp 98.5°F | Resp 21

## 2023-02-03 DIAGNOSIS — C9000 Multiple myeloma not having achieved remission: Secondary | ICD-10-CM

## 2023-02-03 DIAGNOSIS — D472 Monoclonal gammopathy: Secondary | ICD-10-CM

## 2023-02-03 DIAGNOSIS — Z95828 Presence of other vascular implants and grafts: Secondary | ICD-10-CM

## 2023-02-03 DIAGNOSIS — Z5112 Encounter for antineoplastic immunotherapy: Secondary | ICD-10-CM | POA: Diagnosis not present

## 2023-02-03 LAB — CBC WITH DIFFERENTIAL (CANCER CENTER ONLY)
Abs Immature Granulocytes: 0.07 10*3/uL (ref 0.00–0.07)
Basophils Absolute: 0 10*3/uL (ref 0.0–0.1)
Basophils Relative: 0 %
Eosinophils Absolute: 0 10*3/uL (ref 0.0–0.5)
Eosinophils Relative: 0 %
HCT: 32.2 % — ABNORMAL LOW (ref 36.0–46.0)
Hemoglobin: 10.6 g/dL — ABNORMAL LOW (ref 12.0–15.0)
Immature Granulocytes: 1 %
Lymphocytes Relative: 21 %
Lymphs Abs: 3.1 10*3/uL (ref 0.7–4.0)
MCH: 30.6 pg (ref 26.0–34.0)
MCHC: 32.9 g/dL (ref 30.0–36.0)
MCV: 93.1 fL (ref 80.0–100.0)
Monocytes Absolute: 1.7 10*3/uL — ABNORMAL HIGH (ref 0.1–1.0)
Monocytes Relative: 12 %
Neutro Abs: 9.8 10*3/uL — ABNORMAL HIGH (ref 1.7–7.7)
Neutrophils Relative %: 66 %
Platelet Count: 316 10*3/uL (ref 150–400)
RBC: 3.46 MIL/uL — ABNORMAL LOW (ref 3.87–5.11)
RDW: 17 % — ABNORMAL HIGH (ref 11.5–15.5)
WBC Count: 14.6 10*3/uL — ABNORMAL HIGH (ref 4.0–10.5)
nRBC: 0 % (ref 0.0–0.2)

## 2023-02-03 LAB — CMP (CANCER CENTER ONLY)
ALT: 14 U/L (ref 0–44)
AST: 14 U/L — ABNORMAL LOW (ref 15–41)
Albumin: 3.5 g/dL (ref 3.5–5.0)
Alkaline Phosphatase: 41 U/L (ref 38–126)
Anion gap: 5 (ref 5–15)
BUN: 15 mg/dL (ref 6–20)
CO2: 26 mmol/L (ref 22–32)
Calcium: 8.5 mg/dL — ABNORMAL LOW (ref 8.9–10.3)
Chloride: 107 mmol/L (ref 98–111)
Creatinine: 0.73 mg/dL (ref 0.44–1.00)
GFR, Estimated: >60 mL/min (ref >60)
Glucose, Bld: 129 mg/dL — ABNORMAL HIGH (ref 70–99)
Potassium: 3.5 mmol/L (ref 3.5–5.1)
Sodium: 138 mmol/L (ref 135–145)
Total Bilirubin: 0.5 mg/dL (ref 0.3–1.2)
Total Protein: 7 g/dL (ref 6.5–8.1)

## 2023-02-03 LAB — PREGNANCY, URINE: Preg Test, Ur: NEGATIVE

## 2023-02-03 MED ORDER — DIPHENHYDRAMINE HCL 25 MG PO CAPS
50.0000 mg | ORAL_CAPSULE | Freq: Once | ORAL | Status: AC
  Administered 2023-02-03: 50 mg via ORAL
  Filled 2023-02-03: qty 2

## 2023-02-03 MED ORDER — ACETAMINOPHEN 325 MG PO TABS
650.0000 mg | ORAL_TABLET | Freq: Once | ORAL | Status: AC
  Administered 2023-02-03: 650 mg via ORAL
  Filled 2023-02-03: qty 2

## 2023-02-03 MED ORDER — PROCHLORPERAZINE MALEATE 10 MG PO TABS
10.0000 mg | ORAL_TABLET | Freq: Four times a day (QID) | ORAL | Status: DC | PRN
  Administered 2023-02-03: 10 mg via ORAL
  Filled 2023-02-03: qty 1

## 2023-02-03 MED ORDER — LENALIDOMIDE 25 MG PO CAPS
25.0000 mg | ORAL_CAPSULE | Freq: Every day | ORAL | 0 refills | Status: DC
Start: 2023-02-03 — End: 2023-02-25

## 2023-02-03 MED ORDER — HEPARIN SOD (PORK) LOCK FLUSH 100 UNIT/ML IV SOLN
500.0000 [IU] | Freq: Once | INTRAVENOUS | Status: AC | PRN
  Administered 2023-02-03: 500 [IU]

## 2023-02-03 MED ORDER — SODIUM CHLORIDE 0.9 % IV SOLN
20.0000 mg | Freq: Once | INTRAVENOUS | Status: AC
  Administered 2023-02-03: 20 mg via INTRAVENOUS
  Filled 2023-02-03: qty 20

## 2023-02-03 MED ORDER — BORTEZOMIB CHEMO SQ INJECTION 3.5 MG (2.5MG/ML)
1.3000 mg/m2 | Freq: Once | INTRAMUSCULAR | Status: AC
  Administered 2023-02-03: 2.75 mg via SUBCUTANEOUS
  Filled 2023-02-03: qty 1.1

## 2023-02-03 MED ORDER — SODIUM CHLORIDE 0.9 % IV SOLN: Freq: Once | INTRAVENOUS | Status: AC

## 2023-02-03 MED ORDER — SODIUM CHLORIDE 0.9 % IV SOLN
16.0000 mg/kg | Freq: Once | INTRAVENOUS | Status: AC
  Administered 2023-02-03: 1600 mg via INTRAVENOUS
  Filled 2023-02-03: qty 80

## 2023-02-03 MED ORDER — SODIUM CHLORIDE 0.9% FLUSH
10.0000 mL | INTRAVENOUS | Status: DC | PRN
  Administered 2023-02-03: 10 mL

## 2023-02-03 MED ORDER — SODIUM CHLORIDE 0.9% FLUSH
10.0000 mL | Freq: Once | INTRAVENOUS | Status: AC
  Administered 2023-02-03: 10 mL

## 2023-02-03 NOTE — Assessment & Plan Note (Signed)
This is a very pleasant 47 year old female patient with a past medical history significant for gestational diabetes and trigeminal neuralgia corrected with surgery, hypertension referred to hematology for evaluation and recommendations regarding severe normocytic normochromic anemia.   BMB confirmed Ig G kappa MM, bone survey pending scheduled for Wednesday. FISH for multiple myeloma showed a gain of chromosome 11, characterized as standard risk.   Cytogenetics showed normal female karyotype RISS-II, median survival 42 months.  We started her on daratumumab with Velcade and dexamethasone prior to her FISH results being available. She started cycle 1 day 1 of Darzalex/Velcade with dexamethasone on 09/20/2022.   From second cycle, we transitioned to VRD since she is a standard risk. [facility] transplant team suggested we go back on Dara VRD for induction.   She is now here for follow up before planned cycle. She is doing very well today. She denies any complaints. She is just worried about the dates for transplant and how the process will go. No pertinent ROS or physical exam findings. Ok to proceed with treatment as planned. I will coordinate with Dr Rosaria Ferries regarding the transplant details and keep the patient informed. "She was strongly encouraged to call Dr Glori Bickers office or here if she has any questions.

## 2023-02-03 NOTE — Progress Notes (Signed)
[facility] Cancer Follow up:    Kara Saint, MD [address] [place] [address]   DIAGNOSIS: Multiple Myeloma  SUMMARY OF ONCOLOGIC HISTORY: Oncology History  Multiple myeloma (HCC)  09/12/2022 Initial Diagnosis   Multiple myeloma (HCC)   09/20/2022 - 10/04/2022 Chemotherapy   Patient is on Treatment Plan : MYELOMA NEWLY DIAGNOSED TRANSPLANT CANDIDATE DaraVRd (Daratumumab IV) q21d x 6 Cycles (Induction/Consolidation)     10/11/2022 - 10/22/2022 Chemotherapy   Patient is on Treatment Plan : MYELOMA NON-TRANSPLANT CANDIDATES VRd SQ q21d     11/04/2022 -  Chemotherapy   Patient is on Treatment Plan : MYELOMA NEWLY DIAGNOSED TRANSPLANT CANDIDATE DaraVRd (Daratumumab IV) q21d x 6 Cycles (Induction/Consolidation)       CURRENT THERAPY: DaraVRD  INTERVAL HISTORY:  Kara Medina 47 y.o. female returns for f/u and evaluation prior to receiving treatment for her multiple myeloma.  She is doing well.  No complaints at all. She denies any side effects.  She states she was done with Revlimid on July 12 and was waiting for a refill but did not know if she still needs to continue this in preparation for transplant.  She is otherwise compliant with all her medications. No adverse effects reported since her last visit.  She had a few questions about the process for transplant.  Rest of the pertinent 10 point ROS reviewed and negative  Patient Active Problem List   Diagnosis Date Noted   Family history of mixed hyperlipidemia 01/30/2023   Obstructive sleep apnea 01/30/2023   Port-A-Cath in place 01/06/2023   HTN (hypertension) 11/14/2022   Multiple myeloma (HCC) 09/12/2022   MGUS (monoclonal gammopathy of unknown significance) 09/10/2022   Hypertensive urgency 08/29/2022   Iron deficiency anemia 08/29/2022   LVH (left ventricular hypertrophy) 08/29/2022   Normocytic anemia 08/29/2022    is allergic to amoxicillin.  MEDICAL HISTORY: Past Medical History:   Diagnosis Date   Anemia    Gestational diabetes    diet controlled   Postpartum care following vaginal delivery (7/27) 02/09/2016   Pregnancy induced hypertension    Trigeminal neuralgia    2012 corrected with surgery    SURGICAL HISTORY: Past Surgical History:  Procedure Laterality Date   APPENDECTOMY  2008   IR IMAGING GUIDED PORT INSERTION  12/03/2022   TRIGEMINAL NERVE DECOMPRESSION  2012    SOCIAL HISTORY: Social History   Socioeconomic History   Marital status: Married    Spouse name: Not on file   Number of children: Not on file   Years of education: Not on file   Highest education level: Bachelor's degree (e.g., BA, AB, BS)  Occupational History   Not on file  Tobacco Use   Smoking status: Never   Smokeless tobacco: Never  Vaping Use   Vaping status: Never Used  Substance and Sexual Activity   Alcohol use: No   Drug use: No   Sexual activity: Yes  Other Topics Concern   Not on file  Social History Narrative   Not on file   Social Determinants of Health   Financial Resource Strain: Low Risk  (08/23/2022)   Overall Financial Resource Strain (CARDIA)    Difficulty of Paying Living Expenses: Not hard at all  Food Insecurity: No Food Insecurity (08/23/2022)   Hunger Vital Sign    Worried About Running Out of Food in the Last Year: Never true    Ran Out of Food in the Last Year: Never true  Transportation Needs:  No Transportation Needs (08/23/2022)   PRAPARE - Administrator, Civil Service (Medical): No    Lack of Transportation (Non-Medical): No  Physical Activity: Insufficiently Active (08/23/2022)   Exercise Vital Sign    Days of Exercise per Week: 2 days    Minutes of Exercise per Session: 30 min  Stress: Stress Concern Present (08/23/2022)   [employer] of Occupational Health - Occupational Stress Questionnaire    Feeling of Stress : To some extent  Social Connections: Unknown (08/23/2022)   Social Connection and Isolation Panel [NHANES]     Frequency of Communication with Friends and Family: More than three times a week    Frequency of Social Gatherings with Friends and Family: Once a week    Attends Religious Services: Patient declined    Database administrator or Organizations: Yes    Attends Engineer, structural: More than 4 times per year    Marital Status: Married  Catering manager Violence: Not on file    FAMILY HISTORY: Family History  Problem Relation Age of Onset   Cancer Other    Hypertension Other    Healthy Father    Cancer Paternal Grandfather        stomach, lung   Hypertension Mother    Breast cancer Mother 65   Hypertension Maternal Grandmother    Arthritis Maternal Grandmother    Hypertension Maternal Grandfather    Other Brother        ?prediabetic   Diabetes Paternal Grandmother    Hypertension Paternal Grandmother    Autism Son     Review of Systems  Constitutional:  Positive for fatigue. Negative for appetite change, chills, fever and unexpected weight change.  HENT:   Negative for hearing loss, lump/mass and trouble swallowing.   Eyes:  Negative for eye problems and icterus.  Respiratory:  Negative for chest tightness, cough and shortness of breath.   Cardiovascular:  Negative for chest pain, leg swelling and palpitations.  Gastrointestinal:  Negative for abdominal distention, abdominal pain, constipation, diarrhea, nausea and vomiting.  Endocrine: Negative for hot flashes.  Genitourinary:  Negative for difficulty urinating.   Musculoskeletal:  Negative for arthralgias.  Skin:  Negative for itching and rash.  Neurological:  Negative for dizziness, extremity weakness, headaches and numbness.  Hematological:  Negative for adenopathy. Does not bruise/bleed easily.  Psychiatric/Behavioral:  Negative for depression. The patient is not nervous/anxious.       PHYSICAL EXAMINATION    Vitals:   02/03/23 0938  BP: 132/87  Pulse: 93  Resp: 16  Temp: 97.9 F (36.6 C)  SpO2:  96%    Physical Exam Constitutional:      General: She is not in acute distress.    Appearance: Normal appearance. She is not toxic-appearing.  HENT:     Head: Normocephalic and atraumatic.     Mouth/Throat:     Mouth: Mucous membranes are moist.     Pharynx: Oropharynx is clear. No oropharyngeal exudate or posterior oropharyngeal erythema.  Eyes:     General: No scleral icterus. Cardiovascular:     Rate and Rhythm: Normal rate and regular rhythm.     Pulses: Normal pulses.     Heart sounds: Normal heart sounds.  Pulmonary:     Effort: Pulmonary effort is normal.     Breath sounds: Normal breath sounds.  Abdominal:     General: Abdomen is flat. Bowel sounds are normal. There is no distension.     Palpations: Abdomen is  soft.     Tenderness: There is no abdominal tenderness.  Musculoskeletal:        General: No swelling.     Cervical back: Neck supple.  Lymphadenopathy:     Cervical: No cervical adenopathy.  Skin:    General: Skin is warm and dry.     Findings: No rash.  Neurological:     General: No focal deficit present.     Mental Status: She is alert.  Psychiatric:        Mood and Affect: Mood normal.        Behavior: Behavior normal.     LABORATORY DATA:  CBC    Component Value Date/Time   WBC 14.6 (H) 02/03/2023 0901   WBC 7.9 12/23/2022 0905   RBC 3.46 (L) 02/03/2023 0901   HGB 10.6 (L) 02/03/2023 0901   HCT 32.2 (L) 02/03/2023 0901   HCT 24.4 (L) 09/04/2022 1037   PLT 316 02/03/2023 0901   MCV 93.1 02/03/2023 0901   MCH 30.6 02/03/2023 0901   MCHC 32.9 02/03/2023 0901   RDW 17.0 (H) 02/03/2023 0901   LYMPHSABS 3.1 02/03/2023 0901   MONOABS 1.7 (H) 02/03/2023 0901   EOSABS 0.0 02/03/2023 0901   BASOSABS 0.0 02/03/2023 0901    CMP     Component Value Date/Time   NA 138 02/03/2023 0901   K 3.5 02/03/2023 0901   CL 107 02/03/2023 0901   CO2 26 02/03/2023 0901   GLUCOSE 129 (H) 02/03/2023 0901   BUN 15 02/03/2023 0901   CREATININE 0.73  02/03/2023 0901   CREATININE 0.95 08/23/2022 1631   CALCIUM 8.5 (L) 02/03/2023 0901   PROT 7.0 02/03/2023 0901   ALBUMIN 3.5 02/03/2023 0901   AST 14 (L) 02/03/2023 0901   ALT 14 02/03/2023 0901   ALKPHOS 41 02/03/2023 0901   BILITOT 0.5 02/03/2023 0901   GFRNONAA >60 02/03/2023 0901   GFRAA >60 02/09/2016 0525         ASSESSMENT and THERAPY PLAN:   Multiple myeloma (HCC) This is a very pleasant 47 year old female patient with a past medical history significant for gestational diabetes and trigeminal neuralgia corrected with surgery, hypertension referred to hematology for evaluation and recommendations regarding severe normocytic normochromic anemia.   BMB confirmed Ig G kappa MM, bone survey pending scheduled for Wednesday. FISH for multiple myeloma showed a gain of chromosome 11, characterized as standard risk.   Cytogenetics showed normal female karyotype RISS-II, median survival 42 months.  We started her on daratumumab with Velcade and dexamethasone prior to her FISH results being available. She started cycle 1 day 1 of Darzalex/Velcade with dexamethasone on 09/20/2022.   From second cycle, we transitioned to VRD since she is a standard risk. [facility] transplant team suggested we go back on Dara VRD for induction.   She is now here for follow up before planned cycle. She is doing very well today. She denies any complaints. She is just worried about the dates for transplant and how the process will go. No pertinent ROS or physical exam findings. Ok to proceed with treatment as planned. I will coordinate with Dr Rosaria Ferries regarding the transplant details and keep the patient informed. "She was strongly encouraged to call Dr Glori Bickers office or here if she has any questions.     All questions were answered. The patient knows to call the clinic with any problems, questions or concerns. We can certainly see the patient much sooner if necessary.  Total encounter time:30 minutes*in  face-to-face  visit time, chart review, lab review, care coordination, order entry, and documentation of the encounter time.   *Total Encounter Time as defined by the Centers for Medicare and Medicaid Services includes, in addition to the face-to-face time of a patient visit (documented in the note above) non-face-to-face time: obtaining and reviewing outside history, ordering and reviewing medications, tests or procedures, care coordination (communications with other health care professionals or caregivers) and documentation in the medical record.

## 2023-02-03 NOTE — Patient Instructions (Signed)
[facility]  Discharge Instructions: Thank you for choosing [facility] to provide your oncology and hematology care.   If you have a lab appointment with the [facility], please go directly to the [facility] and check in at the registration area.   Wear comfortable clothing and clothing appropriate for easy access to any Portacath or PICC line.   We strive to give you quality time with your provider. You may need to reschedule your appointment if you arrive late (15 or more minutes).  Arriving late affects you and other patients whose appointments are after yours.  Also, if you miss three or more appointments without notifying the office, you may be dismissed from the clinic at the provider's discretion.      For prescription refill requests, have your pharmacy contact our office and allow 72 hours for refills to be completed.    Today you received the following chemotherapy and/or immunotherapy agents Velcade, Darzalex.      To help prevent nausea and vomiting after your treatment, we encourage you to take your nausea medication as directed.  BELOW ARE SYMPTOMS THAT SHOULD BE REPORTED IMMEDIATELY: *FEVER GREATER THAN 100.4 F (38 C) OR HIGHER *CHILLS OR SWEATING *NAUSEA AND VOMITING THAT IS NOT CONTROLLED WITH YOUR NAUSEA MEDICATION *UNUSUAL SHORTNESS OF BREATH *UNUSUAL BRUISING OR BLEEDING *URINARY PROBLEMS (pain or burning when urinating, or frequent urination) *BOWEL PROBLEMS (unusual diarrhea, constipation, pain near the anus) TENDERNESS IN MOUTH AND THROAT WITH OR WITHOUT PRESENCE OF ULCERS (sore throat, sores in mouth, or a toothache) UNUSUAL RASH, SWELLING OR PAIN  UNUSUAL VAGINAL DISCHARGE OR ITCHING   Items with * indicate a potential emergency and should be followed up as soon as possible or go to the Emergency Department if any problems should occur.  Please show the CHEMOTHERAPY ALERT CARD or IMMUNOTHERAPY ALERT CARD  at check-in to the Emergency Department and triage nurse.  Should you have questions after your visit or need to cancel or reschedule your appointment, please contact [facility]  Dept: [phone]  and follow the prompts.  Office hours are 8:00 a.m. to 4:30 p.m. Monday - Friday. Please note that voicemails left after 4:00 p.m. may not be returned until the following business day.  We are closed weekends and major holidays. You have access to a nurse at all times for urgent questions. Please call the main number to the clinic Dept: [phone] and follow the prompts.   For any non-urgent questions, you may also contact your provider using MyChart. We now offer e-Visits for anyone 21 and older to request care online for non-urgent symptoms. For details visit [email or web address].   Also download the MyChart app! Go to the app store, search "MyChart", open the app, select [facility], and log in with your MyChart username and password.

## 2023-02-03 NOTE — Progress Notes (Signed)
Per MD keep Daratumumab dose at 1600mg 

## 2023-02-04 LAB — KAPPA/LAMBDA LIGHT CHAINS
Kappa free light chain: 57.8 mg/L — ABNORMAL HIGH (ref 3.3–19.4)
Kappa, lambda light chain ratio: 9.63 — ABNORMAL HIGH (ref 0.26–1.65)
Lambda free light chains: 6 mg/L (ref 5.7–26.3)

## 2023-02-05 LAB — IGG, IGA, IGM
IgA: 85 mg/dL — ABNORMAL LOW (ref 87–352)
IgG (Immunoglobin G), Serum: 2338 mg/dL — ABNORMAL HIGH (ref 586–1602)
IgM (Immunoglobulin M), Srm: 23 mg/dL — ABNORMAL LOW (ref 26–217)

## 2023-02-06 ENCOUNTER — Inpatient Hospital Stay: Payer: 59

## 2023-02-06 VITALS — BP 127/85 | HR 89 | Temp 98.1°F | Resp 11

## 2023-02-06 DIAGNOSIS — D472 Monoclonal gammopathy: Secondary | ICD-10-CM

## 2023-02-06 DIAGNOSIS — Z5112 Encounter for antineoplastic immunotherapy: Secondary | ICD-10-CM | POA: Diagnosis not present

## 2023-02-06 DIAGNOSIS — C9000 Multiple myeloma not having achieved remission: Secondary | ICD-10-CM

## 2023-02-06 LAB — MULTIPLE MYELOMA PANEL, SERUM
Albumin SerPl Elph-Mcnc: 3 g/dL (ref 2.9–4.4)
Albumin/Glob SerPl: 0.9 (ref 0.7–1.7)
Alpha 1: 0.2 g/dL (ref 0.0–0.4)
Gamma Glob SerPl Elph-Mcnc: 1.7 g/dL (ref 0.4–1.8)
IgG (Immunoglobin G), Serum: 2077 mg/dL — ABNORMAL HIGH (ref 586–1602)
IgM (Immunoglobulin M), Srm: 33 mg/dL (ref 26–217)

## 2023-02-06 MED ORDER — BORTEZOMIB CHEMO SQ INJECTION 3.5 MG (2.5MG/ML)
1.3000 mg/m2 | Freq: Once | INTRAMUSCULAR | Status: AC
  Administered 2023-02-06: 2.75 mg via SUBCUTANEOUS
  Filled 2023-02-06: qty 1.1

## 2023-02-06 MED ORDER — PROCHLORPERAZINE MALEATE 10 MG PO TABS
10.0000 mg | ORAL_TABLET | Freq: Once | ORAL | Status: AC
  Administered 2023-02-06: 10 mg via ORAL
  Filled 2023-02-06: qty 1

## 2023-02-06 NOTE — Patient Instructions (Signed)
[facility]  Discharge Instructions: Thank you for choosing [facility] to provide your oncology and hematology care.   If you have a lab appointment with the Cancer Center, please go directly to the Cancer Center and check in at the registration area.   Wear comfortable clothing and clothing appropriate for easy access to any Portacath or PICC line.   We strive to give you quality time with your provider. You may need to reschedule your appointment if you arrive late (15 or more minutes).  Arriving late affects you and other patients whose appointments are after yours.  Also, if you miss three or more appointments without notifying the office, you may be dismissed from the clinic at the provider's discretion.      For prescription refill requests, have your pharmacy contact our office and allow 72 hours for refills to be completed.    Today you received the following chemotherapy and/or immunotherapy agents: Velcade        To help prevent nausea and vomiting after your treatment, we encourage you to take your nausea medication as directed.  BELOW ARE SYMPTOMS THAT SHOULD BE REPORTED IMMEDIATELY: *FEVER GREATER THAN 100.4 F (38 C) OR HIGHER *CHILLS OR SWEATING *NAUSEA AND VOMITING THAT IS NOT CONTROLLED WITH YOUR NAUSEA MEDICATION *UNUSUAL SHORTNESS OF BREATH *UNUSUAL BRUISING OR BLEEDING *URINARY PROBLEMS (pain or burning when urinating, or frequent urination) *BOWEL PROBLEMS (unusual diarrhea, constipation, pain near the anus) TENDERNESS IN MOUTH AND THROAT WITH OR WITHOUT PRESENCE OF ULCERS (sore throat, sores in mouth, or a toothache) UNUSUAL RASH, SWELLING OR PAIN  UNUSUAL VAGINAL DISCHARGE OR ITCHING   Items with * indicate a potential emergency and should be followed up as soon as possible or go to the Emergency Department if any problems should occur.  Please show the CHEMOTHERAPY ALERT CARD or IMMUNOTHERAPY ALERT CARD at  check-in to the Emergency Department and triage nurse.  Should you have questions after your visit or need to cancel or reschedule your appointment, please contact [facility]  Dept: [phone]  and follow the prompts.  Office hours are 8:00 a.m. to 4:30 p.m. Monday - Friday. Please note that voicemails left after 4:00 p.m. may not be returned until the following business day.  We are closed weekends and major holidays. You have access to a nurse at all times for urgent questions. Please call the main number to the clinic Dept: [phone] and follow the prompts.   For any non-urgent questions, you may also contact your provider using MyChart. We now offer e-Visits for anyone 18 and older to request care online for non-urgent symptoms. For details visit [email or web address].   Also download the MyChart app! Go to the app store, search "MyChart", open the app, select [facility], and log in with your MyChart username and password.  

## 2023-02-07 ENCOUNTER — Encounter: Payer: Self-pay | Admitting: Hematology and Oncology

## 2023-02-10 ENCOUNTER — Encounter: Payer: Self-pay | Admitting: Adult Health

## 2023-02-10 ENCOUNTER — Inpatient Hospital Stay: Payer: 59

## 2023-02-10 ENCOUNTER — Inpatient Hospital Stay (HOSPITAL_BASED_OUTPATIENT_CLINIC_OR_DEPARTMENT_OTHER): Payer: 59 | Admitting: Adult Health

## 2023-02-10 VITALS — BP 149/95 | HR 99 | Temp 97.9°F | Resp 17 | Wt 248.1 lb

## 2023-02-10 DIAGNOSIS — D472 Monoclonal gammopathy: Secondary | ICD-10-CM | POA: Diagnosis not present

## 2023-02-10 DIAGNOSIS — C9000 Multiple myeloma not having achieved remission: Secondary | ICD-10-CM

## 2023-02-10 DIAGNOSIS — Z5112 Encounter for antineoplastic immunotherapy: Secondary | ICD-10-CM | POA: Diagnosis not present

## 2023-02-10 DIAGNOSIS — Z95828 Presence of other vascular implants and grafts: Secondary | ICD-10-CM

## 2023-02-10 LAB — CMP (CANCER CENTER ONLY)
ALT: 15 U/L (ref 0–44)
AST: 13 U/L — ABNORMAL LOW (ref 15–41)
Albumin: 3.5 g/dL (ref 3.5–5.0)
Alkaline Phosphatase: 41 U/L (ref 38–126)
Anion gap: 5 (ref 5–15)
BUN: 16 mg/dL (ref 6–20)
CO2: 28 mmol/L (ref 22–32)
Calcium: 8.5 mg/dL — ABNORMAL LOW (ref 8.9–10.3)
Chloride: 105 mmol/L (ref 98–111)
Creatinine: 0.67 mg/dL (ref 0.44–1.00)
GFR, Estimated: >60 mL/min (ref >60)
Glucose, Bld: 98 mg/dL (ref 70–99)
Potassium: 3.3 mmol/L — ABNORMAL LOW (ref 3.5–5.1)
Sodium: 138 mmol/L (ref 135–145)
Total Bilirubin: 0.4 mg/dL (ref 0.3–1.2)
Total Protein: 6.8 g/dL (ref 6.5–8.1)

## 2023-02-10 LAB — CBC WITH DIFFERENTIAL (CANCER CENTER ONLY)
Abs Immature Granulocytes: 0.25 10*3/uL — ABNORMAL HIGH (ref 0.00–0.07)
Basophils Absolute: 0.1 10*3/uL (ref 0.0–0.1)
Basophils Relative: 1 %
Eosinophils Absolute: 0.1 10*3/uL (ref 0.0–0.5)
Eosinophils Relative: 1 %
HCT: 31.8 % — ABNORMAL LOW (ref 36.0–46.0)
Hemoglobin: 10.6 g/dL — ABNORMAL LOW (ref 12.0–15.0)
Immature Granulocytes: 2 %
Lymphocytes Relative: 26 %
Lymphs Abs: 2.7 10*3/uL (ref 0.7–4.0)
MCH: 31.1 pg (ref 26.0–34.0)
MCHC: 33.3 g/dL (ref 30.0–36.0)
MCV: 93.3 fL (ref 80.0–100.0)
Monocytes Absolute: 1.6 10*3/uL — ABNORMAL HIGH (ref 0.1–1.0)
Monocytes Relative: 16 %
Neutro Abs: 5.7 10*3/uL (ref 1.7–7.7)
Neutrophils Relative %: 54 %
Platelet Count: 211 10*3/uL (ref 150–400)
RBC: 3.41 MIL/uL — ABNORMAL LOW (ref 3.87–5.11)
RDW: 17 % — ABNORMAL HIGH (ref 11.5–15.5)
WBC Count: 10.4 10*3/uL (ref 4.0–10.5)
nRBC: 0.2 % (ref 0.0–0.2)

## 2023-02-10 MED ORDER — DEXAMETHASONE 4 MG PO TABS
20.0000 mg | ORAL_TABLET | Freq: Once | ORAL | Status: AC
  Administered 2023-02-10: 20 mg via ORAL
  Filled 2023-02-10: qty 5

## 2023-02-10 MED ORDER — HEPARIN SOD (PORK) LOCK FLUSH 100 UNIT/ML IV SOLN: 500.0000 [IU] | Freq: Once | INTRAVENOUS | Status: DC | PRN

## 2023-02-10 MED ORDER — PROCHLORPERAZINE MALEATE 10 MG PO TABS
10.0000 mg | ORAL_TABLET | Freq: Four times a day (QID) | ORAL | Status: DC | PRN
  Administered 2023-02-10: 10 mg via ORAL
  Filled 2023-02-10: qty 1

## 2023-02-10 MED ORDER — BORTEZOMIB CHEMO SQ INJECTION 3.5 MG (2.5MG/ML)
1.3000 mg/m2 | Freq: Once | INTRAMUSCULAR | Status: AC
  Administered 2023-02-10: 2.75 mg via SUBCUTANEOUS
  Filled 2023-02-10: qty 1.1

## 2023-02-10 MED ORDER — SODIUM CHLORIDE 0.9% FLUSH: 10.0000 mL | Freq: Once | INTRAVENOUS | Status: DC | PRN

## 2023-02-10 NOTE — Assessment & Plan Note (Signed)
Kara Medina is a 47 year old woman with multiple myeloma here today for f/u and evaluation prior to receiving VRD.  Multiple Myeloma: Her labs have improved and she is tolerating treatment well.  She will proceed with Velcade today. Fatigue: Managed with energy conservation which I recommended that she continue. Hypokalemia: She continues a potassium rich diet.  Her potassium level is pending today. Hypocalcemia: She continues to take Tums 2 tablets 3 times a day.  Her current calcium level is pending. Nausea: resolved after day one of Revlimid.    She will return in August 1 for labs and an injection.  We do not have any appointments scheduled with her after this.  She will touch base with [place] once she meets with Dr. Lalla Brothers and understands next steps for her upcoming transplant.

## 2023-02-10 NOTE — Patient Instructions (Signed)
[facility]  Discharge Instructions: Thank you for choosing [facility] to provide your oncology and hematology care.   If you have a lab appointment with the Cancer Center, please go directly to the Cancer Center and check in at the registration area.   Wear comfortable clothing and clothing appropriate for easy access to any Portacath or PICC line.   We strive to give you quality time with your provider. You may need to reschedule your appointment if you arrive late (15 or more minutes).  Arriving late affects you and other patients whose appointments are after yours.  Also, if you miss three or more appointments without notifying the office, you may be dismissed from the clinic at the provider's discretion.      For prescription refill requests, have your pharmacy contact our office and allow 72 hours for refills to be completed.    Today you received the following chemotherapy and/or immunotherapy agents: Velcade        To help prevent nausea and vomiting after your treatment, we encourage you to take your nausea medication as directed.  BELOW ARE SYMPTOMS THAT SHOULD BE REPORTED IMMEDIATELY: *FEVER GREATER THAN 100.4 F (38 C) OR HIGHER *CHILLS OR SWEATING *NAUSEA AND VOMITING THAT IS NOT CONTROLLED WITH YOUR NAUSEA MEDICATION *UNUSUAL SHORTNESS OF BREATH *UNUSUAL BRUISING OR BLEEDING *URINARY PROBLEMS (pain or burning when urinating, or frequent urination) *BOWEL PROBLEMS (unusual diarrhea, constipation, pain near the anus) TENDERNESS IN MOUTH AND THROAT WITH OR WITHOUT PRESENCE OF ULCERS (sore throat, sores in mouth, or a toothache) UNUSUAL RASH, SWELLING OR PAIN  UNUSUAL VAGINAL DISCHARGE OR ITCHING   Items with * indicate a potential emergency and should be followed up as soon as possible or go to the Emergency Department if any problems should occur.  Please show the CHEMOTHERAPY ALERT CARD or IMMUNOTHERAPY ALERT CARD at  check-in to the Emergency Department and triage nurse.  Should you have questions after your visit or need to cancel or reschedule your appointment, please contact [facility]  Dept: [phone]  and follow the prompts.  Office hours are 8:00 a.m. to 4:30 p.m. Monday - Friday. Please note that voicemails left after 4:00 p.m. may not be returned until the following business day.  We are closed weekends and major holidays. You have access to a nurse at all times for urgent questions. Please call the main number to the clinic Dept: [phone] and follow the prompts.   For any non-urgent questions, you may also contact your provider using MyChart. We now offer e-Visits for anyone 18 and older to request care online for non-urgent symptoms. For details visit [email or web address].   Also download the MyChart app! Go to the app store, search "MyChart", open the app, select [facility], and log in with your MyChart username and password.  

## 2023-02-10 NOTE — Progress Notes (Signed)
[facility] Cancer Follow up:    Kara Saint, MD [address] [place] [address]   DIAGNOSIS:Multiple Myeloma  SUMMARY OF ONCOLOGIC HISTORY: Oncology History  Multiple myeloma (HCC)  09/12/2022 Initial Diagnosis   Multiple myeloma (HCC)   09/20/2022 - 10/04/2022 Chemotherapy   Patient is on Treatment Plan : MYELOMA NEWLY DIAGNOSED TRANSPLANT CANDIDATE DaraVRd (Daratumumab IV) q21d x 6 Cycles (Induction/Consolidation)     10/11/2022 - 10/22/2022 Chemotherapy   Patient is on Treatment Plan : MYELOMA NON-TRANSPLANT CANDIDATES VRd SQ q21d     11/04/2022 -  Chemotherapy   Patient is on Treatment Plan : MYELOMA NEWLY DIAGNOSED TRANSPLANT CANDIDATE DaraVRd (Daratumumab IV) q21d x 6 Cycles (Induction/Consolidation)       CURRENT THERAPY: Vrd  INTERVAL HISTORY: Kara Medina 47 y.o. female returns for follow up today of her multiple myeloma.  She is feeling well today.  She has an appt with Dr. Rosaria Ferries at [facility] this afternoon.   [name] is scheduled to receive Dexamethasone and Velcade today.  She continues to tolerate this well and denies neuropathy.  She recently dose increased the Revlimid on July 21 and noted the first day she had increased nausea however subsequently this resolved.  She denies any issues today.   Patient Active Problem List   Diagnosis Date Noted   Family history of mixed hyperlipidemia 01/30/2023   Obstructive sleep apnea 01/30/2023   Port-A-Cath in place 01/06/2023   HTN (hypertension) 11/14/2022   Multiple myeloma (HCC) 09/12/2022   MGUS (monoclonal gammopathy of unknown significance) 09/10/2022   Hypertensive urgency 08/29/2022   Iron deficiency anemia 08/29/2022   LVH (left ventricular hypertrophy) 08/29/2022   Normocytic anemia 08/29/2022    is allergic to amoxicillin.  MEDICAL HISTORY: Past Medical History:  Diagnosis Date   Anemia    Gestational diabetes    diet controlled   Postpartum care following vaginal  delivery (7/27) 02/09/2016   Pregnancy induced hypertension    Trigeminal neuralgia    2012 corrected with surgery    SURGICAL HISTORY: Past Surgical History:  Procedure Laterality Date   APPENDECTOMY  2008   IR IMAGING GUIDED PORT INSERTION  12/03/2022   TRIGEMINAL NERVE DECOMPRESSION  2012    SOCIAL HISTORY: Social History   Socioeconomic History   Marital status: Married    Spouse name: Not on file   Number of children: Not on file   Years of education: Not on file   Highest education level: Bachelor's degree (e.g., BA, AB, BS)  Occupational History   Not on file  Tobacco Use   Smoking status: Never   Smokeless tobacco: Never  Vaping Use   Vaping status: Never Used  Substance and Sexual Activity   Alcohol use: No   Drug use: No   Sexual activity: Yes  Other Topics Concern   Not on file  Social History Narrative   Not on file   Social Determinants of Health   Financial Resource Strain: Low Risk  (08/23/2022)   Overall Financial Resource Strain (CARDIA)    Difficulty of Paying Living Expenses: Not hard at all  Food Insecurity: No Food Insecurity (08/23/2022)   Hunger Vital Sign    Worried About Running Out of Food in the Last Year: Never true    Ran Out of Food in the Last Year: Never true  Transportation Needs: No Transportation Needs (08/23/2022)   PRAPARE - Administrator, Civil Service (Medical): No    Lack of Transportation (  Non-Medical): No  Physical Activity: Insufficiently Active (08/23/2022)   Exercise Vital Sign    Days of Exercise per Week: 2 days    Minutes of Exercise per Session: 30 min  Stress: Stress Concern Present (08/23/2022)   [employer] of Occupational Health - Occupational Stress Questionnaire    Feeling of Stress : To some extent  Social Connections: Unknown (08/23/2022)   Social Connection and Isolation Panel [NHANES]    Frequency of Communication with Friends and Family: More than three times a week    Frequency of Social  Gatherings with Friends and Family: Once a week    Attends Religious Services: Patient declined    Database administrator or Organizations: Yes    Attends Engineer, structural: More than 4 times per year    Marital Status: Married  Catering manager Violence: Not on file    FAMILY HISTORY: Family History  Problem Relation Age of Onset   Cancer Other    Hypertension Other    Healthy Father    Cancer Paternal Grandfather        stomach, lung   Hypertension Mother    Breast cancer Mother 80   Hypertension Maternal Grandmother    Arthritis Maternal Grandmother    Hypertension Maternal Grandfather    Other Brother        ?prediabetic   Diabetes Paternal Grandmother    Hypertension Paternal Grandmother    Autism Son     Review of Systems  Constitutional:  Negative for appetite change, chills, fatigue, fever and unexpected weight change.  HENT:   Negative for hearing loss, lump/mass and trouble swallowing.   Eyes:  Negative for eye problems and icterus.  Respiratory:  Negative for chest tightness, cough and shortness of breath.   Cardiovascular:  Negative for chest pain, leg swelling and palpitations.  Gastrointestinal:  Negative for abdominal distention, abdominal pain, constipation, diarrhea, nausea and vomiting.  Endocrine: Negative for hot flashes.  Genitourinary:  Negative for difficulty urinating.   Musculoskeletal:  Negative for arthralgias.  Skin:  Negative for itching and rash.  Neurological:  Negative for dizziness, extremity weakness, headaches and numbness.  Hematological:  Negative for adenopathy. Does not bruise/bleed easily.  Psychiatric/Behavioral:  Negative for depression. The patient is not nervous/anxious.       PHYSICAL EXAMINATION   Onc Performance Status - 02/10/23 1027       ECOG Perf Status   ECOG Perf Status Fully active, able to carry on all pre-disease performance without restriction      KPS SCALE   KPS % SCORE Normal, no compliants,  no evidence of disease             Vitals:   02/10/23 1024  BP: (!) 149/95  Pulse: 99  Resp: 17  Temp: 97.9 F (36.6 C)  SpO2: 97%    Physical Exam Constitutional:      General: She is not in acute distress.    Appearance: Normal appearance. She is not toxic-appearing.  HENT:     Head: Normocephalic and atraumatic.     Mouth/Throat:     Mouth: Mucous membranes are moist.     Pharynx: Oropharynx is clear. No oropharyngeal exudate or posterior oropharyngeal erythema.  Eyes:     General: No scleral icterus. Cardiovascular:     Rate and Rhythm: Normal rate and regular rhythm.     Pulses: Normal pulses.     Heart sounds: Normal heart sounds.  Pulmonary:     Effort: Pulmonary  effort is normal.     Breath sounds: Normal breath sounds.  Abdominal:     General: Abdomen is flat. Bowel sounds are normal. There is no distension.     Palpations: Abdomen is soft.     Tenderness: There is no abdominal tenderness.  Musculoskeletal:        General: No swelling.     Cervical back: Neck supple.  Lymphadenopathy:     Cervical: No cervical adenopathy.  Skin:    General: Skin is warm and dry.     Findings: No rash.  Neurological:     General: No focal deficit present.     Mental Status: She is alert.  Psychiatric:        Mood and Affect: Mood normal.        Behavior: Behavior normal.     LABORATORY DATA:  CBC    Component Value Date/Time   WBC 10.4 02/10/2023 1009   WBC 7.9 12/23/2022 0905   RBC 3.41 (L) 02/10/2023 1009   HGB 10.6 (L) 02/10/2023 1009   HCT 31.8 (L) 02/10/2023 1009   HCT 24.4 (L) 09/04/2022 1037   PLT 211 02/10/2023 1009   MCV 93.3 02/10/2023 1009   MCH 31.1 02/10/2023 1009   MCHC 33.3 02/10/2023 1009   RDW 17.0 (H) 02/10/2023 1009   LYMPHSABS 2.7 02/10/2023 1009   MONOABS 1.6 (H) 02/10/2023 1009   EOSABS 0.1 02/10/2023 1009   BASOSABS 0.1 02/10/2023 1009    CMP     Component Value Date/Time   NA 138 02/03/2023 0901   K 3.5 02/03/2023 0901    CL 107 02/03/2023 0901   CO2 26 02/03/2023 0901   GLUCOSE 129 (H) 02/03/2023 0901   BUN 15 02/03/2023 0901   CREATININE 0.73 02/03/2023 0901   CREATININE 0.95 08/23/2022 1631   CALCIUM 8.5 (L) 02/03/2023 0901   PROT 7.0 02/03/2023 0901   ALBUMIN 3.5 02/03/2023 0901   AST 14 (L) 02/03/2023 0901   ALT 14 02/03/2023 0901   ALKPHOS 41 02/03/2023 0901   BILITOT 0.5 02/03/2023 0901   GFRNONAA >60 02/03/2023 0901   GFRAA >60 02/09/2016 0525         ASSESSMENT and THERAPY PLAN:   Multiple myeloma (HCC) Trecia Rogers is a 47 year old woman with multiple myeloma here today for f/u and evaluation prior to receiving VRD.  Multiple Myeloma: Her labs have improved and she is tolerating treatment well.  She will proceed with Velcade today. Fatigue: Managed with energy conservation which I recommended that she continue. Hypokalemia: She continues a potassium rich diet.  Her potassium level is pending today. Hypocalcemia: She continues to take Tums 2 tablets 3 times a day.  Her current calcium level is pending. Nausea: resolved after day one of Revlimid.    She will return in August 1 for labs and an injection.  We do not have any appointments scheduled with her after this.  She will touch base with [place] once she meets with Dr. Lalla Brothers and understands next steps for her upcoming transplant.  All questions were answered. The patient knows to call the clinic with any problems, questions or concerns. We can certainly see the patient much sooner if necessary.  Total encounter time:30 minutes*in face-to-face visit time, chart review, lab review, care coordination, order entry, and documentation of the encounter time.  Lillard Anes, NP 02/10/23 10:47 AM Medical Oncology and Hematology [place] [facility] [address] [place], [place] [address] Tel. [phone]    Fax.  [phone]  *Total Encounter Time as defined by the Centers for Medicare and Medicaid Services includes, in addition to  the face-to-face time of a patient visit (documented in the note above) non-face-to-face time: obtaining and reviewing outside history, ordering and reviewing medications, tests or procedures, care coordination (communications with other health care professionals or caregivers) and documentation in the medical record.

## 2023-02-13 ENCOUNTER — Encounter: Payer: Self-pay | Admitting: Hematology and Oncology

## 2023-02-13 ENCOUNTER — Telehealth: Payer: Self-pay | Admitting: *Deleted

## 2023-02-13 ENCOUNTER — Inpatient Hospital Stay: Payer: 59 | Attending: Hematology and Oncology

## 2023-02-13 ENCOUNTER — Other Ambulatory Visit: Payer: Self-pay | Admitting: *Deleted

## 2023-02-13 ENCOUNTER — Other Ambulatory Visit: Payer: Self-pay

## 2023-02-13 VITALS — BP 143/76 | HR 98 | Temp 98.6°F | Resp 20 | Wt 248.0 lb

## 2023-02-13 DIAGNOSIS — Z5112 Encounter for antineoplastic immunotherapy: Secondary | ICD-10-CM | POA: Diagnosis present

## 2023-02-13 DIAGNOSIS — C9000 Multiple myeloma not having achieved remission: Secondary | ICD-10-CM

## 2023-02-13 DIAGNOSIS — D472 Monoclonal gammopathy: Secondary | ICD-10-CM

## 2023-02-13 DIAGNOSIS — Z79899 Other long term (current) drug therapy: Secondary | ICD-10-CM | POA: Insufficient documentation

## 2023-02-13 MED ORDER — PROCHLORPERAZINE MALEATE 10 MG PO TABS
10.0000 mg | ORAL_TABLET | Freq: Once | ORAL | Status: AC
  Administered 2023-02-13: 10 mg via ORAL
  Filled 2023-02-13: qty 1

## 2023-02-13 MED ORDER — BORTEZOMIB CHEMO SQ INJECTION 3.5 MG (2.5MG/ML)
1.3000 mg/m2 | Freq: Once | INTRAMUSCULAR | Status: AC
  Administered 2023-02-13: 2.75 mg via SUBCUTANEOUS
  Filled 2023-02-13: qty 1.1

## 2023-02-13 NOTE — Patient Instructions (Signed)
[facility]  Discharge Instructions: Thank you for choosing [facility] to provide your oncology and hematology care.   If you have a lab appointment with the Cancer Center, please go directly to the Cancer Center and check in at the registration area.   Wear comfortable clothing and clothing appropriate for easy access to any Portacath or PICC line.   We strive to give you quality time with your provider. You may need to reschedule your appointment if you arrive late (15 or more minutes).  Arriving late affects you and other patients whose appointments are after yours.  Also, if you miss three or more appointments without notifying the office, you may be dismissed from the clinic at the provider's discretion.      For prescription refill requests, have your pharmacy contact our office and allow 72 hours for refills to be completed.    Today you received the following chemotherapy and/or immunotherapy agents: Velcade        To help prevent nausea and vomiting after your treatment, we encourage you to take your nausea medication as directed.  BELOW ARE SYMPTOMS THAT SHOULD BE REPORTED IMMEDIATELY: *FEVER GREATER THAN 100.4 F (38 C) OR HIGHER *CHILLS OR SWEATING *NAUSEA AND VOMITING THAT IS NOT CONTROLLED WITH YOUR NAUSEA MEDICATION *UNUSUAL SHORTNESS OF BREATH *UNUSUAL BRUISING OR BLEEDING *URINARY PROBLEMS (pain or burning when urinating, or frequent urination) *BOWEL PROBLEMS (unusual diarrhea, constipation, pain near the anus) TENDERNESS IN MOUTH AND THROAT WITH OR WITHOUT PRESENCE OF ULCERS (sore throat, sores in mouth, or a toothache) UNUSUAL RASH, SWELLING OR PAIN  UNUSUAL VAGINAL DISCHARGE OR ITCHING   Items with * indicate a potential emergency and should be followed up as soon as possible or go to the Emergency Department if any problems should occur.  Please show the CHEMOTHERAPY ALERT CARD or IMMUNOTHERAPY ALERT CARD at  check-in to the Emergency Department and triage nurse.  Should you have questions after your visit or need to cancel or reschedule your appointment, please contact [facility]  Dept: [phone]  and follow the prompts.  Office hours are 8:00 a.m. to 4:30 p.m. Monday - Friday. Please note that voicemails left after 4:00 p.m. may not be returned until the following business day.  We are closed weekends and major holidays. You have access to a nurse at all times for urgent questions. Please call the main number to the clinic Dept: [phone] and follow the prompts.   For any non-urgent questions, you may also contact your provider using MyChart. We now offer e-Visits for anyone 18 and older to request care online for non-urgent symptoms. For details visit [email or web address].   Also download the MyChart app! Go to the app store, search "MyChart", open the app, select [facility], and log in with your MyChart username and password.  

## 2023-02-13 NOTE — Telephone Encounter (Signed)
This RN spoke with pt per need to do an extension of current therapy to get her to her mobilization date per [facility] set for 03/19/2023.  Request is for a 2 week treatment break prior to above starting on 03/05/2023.  Pt is currently on day 8 of 14 day Revlimid with end date of 8/7 ,off x 7 days and then restart on 02/26/2023 for 1 week.  Information sent from [facility] given to covering provider Jonny Ruiz PhD) per above for assistance with appropriate orders.  Pt understands plan and will await call for further appts per above.  Pt will need urine pregnancy on D1 of velcade.

## 2023-02-17 ENCOUNTER — Other Ambulatory Visit: Payer: Self-pay

## 2023-02-17 ENCOUNTER — Other Ambulatory Visit: Payer: Self-pay | Admitting: Pharmacist

## 2023-02-17 DIAGNOSIS — C9000 Multiple myeloma not having achieved remission: Secondary | ICD-10-CM

## 2023-02-17 DIAGNOSIS — D472 Monoclonal gammopathy: Secondary | ICD-10-CM

## 2023-02-18 ENCOUNTER — Encounter: Payer: Self-pay | Admitting: *Deleted

## 2023-02-19 ENCOUNTER — Encounter: Payer: Self-pay | Admitting: Hematology and Oncology

## 2023-02-24 ENCOUNTER — Other Ambulatory Visit: Payer: 59

## 2023-02-24 ENCOUNTER — Other Ambulatory Visit: Payer: Self-pay | Admitting: *Deleted

## 2023-02-24 ENCOUNTER — Telehealth: Payer: Self-pay | Admitting: Adult Health

## 2023-02-24 ENCOUNTER — Ambulatory Visit: Payer: 59 | Admitting: Pharmacist

## 2023-02-24 ENCOUNTER — Ambulatory Visit: Payer: 59

## 2023-02-24 DIAGNOSIS — C9000 Multiple myeloma not having achieved remission: Secondary | ICD-10-CM

## 2023-02-24 DIAGNOSIS — D472 Monoclonal gammopathy: Secondary | ICD-10-CM

## 2023-02-24 NOTE — Telephone Encounter (Signed)
Rescheduled and scheduled appointments per WQ. Unable to leave a voicemail due to mailbox being full.

## 2023-02-25 ENCOUNTER — Other Ambulatory Visit: Payer: Self-pay | Admitting: *Deleted

## 2023-02-25 ENCOUNTER — Inpatient Hospital Stay: Payer: 59 | Admitting: Pharmacist

## 2023-02-25 ENCOUNTER — Inpatient Hospital Stay: Payer: 59

## 2023-02-25 ENCOUNTER — Other Ambulatory Visit: Payer: Self-pay

## 2023-02-25 VITALS — BP 126/70 | HR 87 | Temp 98.5°F | Resp 18 | Wt 256.0 lb

## 2023-02-25 DIAGNOSIS — C9000 Multiple myeloma not having achieved remission: Secondary | ICD-10-CM

## 2023-02-25 DIAGNOSIS — D472 Monoclonal gammopathy: Secondary | ICD-10-CM

## 2023-02-25 DIAGNOSIS — Z95828 Presence of other vascular implants and grafts: Secondary | ICD-10-CM

## 2023-02-25 DIAGNOSIS — Z5112 Encounter for antineoplastic immunotherapy: Secondary | ICD-10-CM | POA: Diagnosis not present

## 2023-02-25 LAB — CBC WITH DIFFERENTIAL (CANCER CENTER ONLY)
Abs Immature Granulocytes: 0.06 10*3/uL (ref 0.00–0.07)
Basophils Absolute: 0.1 10*3/uL (ref 0.0–0.1)
Basophils Relative: 1 %
Eosinophils Absolute: 0.1 10*3/uL (ref 0.0–0.5)
Eosinophils Relative: 2 %
HCT: 29.9 % — ABNORMAL LOW (ref 36.0–46.0)
Hemoglobin: 9.8 g/dL — ABNORMAL LOW (ref 12.0–15.0)
Immature Granulocytes: 1 %
Lymphocytes Relative: 13 %
Lymphs Abs: 0.9 10*3/uL (ref 0.7–4.0)
MCH: 31 pg (ref 26.0–34.0)
MCHC: 32.8 g/dL (ref 30.0–36.0)
MCV: 94.6 fL (ref 80.0–100.0)
Monocytes Absolute: 1.6 10*3/uL — ABNORMAL HIGH (ref 0.1–1.0)
Monocytes Relative: 24 %
Neutro Abs: 4.1 10*3/uL (ref 1.7–7.7)
Neutrophils Relative %: 59 %
Platelet Count: 254 10*3/uL (ref 150–400)
RBC: 3.16 MIL/uL — ABNORMAL LOW (ref 3.87–5.11)
RDW: 16.4 % — ABNORMAL HIGH (ref 11.5–15.5)
WBC Count: 6.8 10*3/uL (ref 4.0–10.5)
nRBC: 0 % (ref 0.0–0.2)

## 2023-02-25 LAB — CMP (CANCER CENTER ONLY)
ALT: 13 U/L (ref 0–44)
AST: 11 U/L — ABNORMAL LOW (ref 15–41)
Albumin: 3.4 g/dL — ABNORMAL LOW (ref 3.5–5.0)
Alkaline Phosphatase: 43 U/L (ref 38–126)
Anion gap: 3 — ABNORMAL LOW (ref 5–15)
BUN: 15 mg/dL (ref 6–20)
CO2: 30 mmol/L (ref 22–32)
Calcium: 8.1 mg/dL — ABNORMAL LOW (ref 8.9–10.3)
Chloride: 104 mmol/L (ref 98–111)
Creatinine: 0.79 mg/dL (ref 0.44–1.00)
GFR, Estimated: >60 mL/min (ref >60)
Glucose, Bld: 101 mg/dL — ABNORMAL HIGH (ref 70–99)
Potassium: 3.8 mmol/L (ref 3.5–5.1)
Sodium: 137 mmol/L (ref 135–145)
Total Bilirubin: 0.4 mg/dL (ref 0.3–1.2)
Total Protein: 6.7 g/dL (ref 6.5–8.1)

## 2023-02-25 LAB — PREGNANCY, URINE: Preg Test, Ur: NEGATIVE

## 2023-02-25 MED ORDER — SODIUM CHLORIDE 0.9% FLUSH
10.0000 mL | INTRAVENOUS | Status: DC | PRN
  Administered 2023-02-25: 10 mL

## 2023-02-25 MED ORDER — PROCHLORPERAZINE MALEATE 10 MG PO TABS
10.0000 mg | ORAL_TABLET | Freq: Once | ORAL | Status: AC
  Administered 2023-02-25: 10 mg via ORAL
  Filled 2023-02-25: qty 1

## 2023-02-25 MED ORDER — HEPARIN SOD (PORK) LOCK FLUSH 100 UNIT/ML IV SOLN
500.0000 [IU] | Freq: Once | INTRAVENOUS | Status: AC | PRN
  Administered 2023-02-25: 500 [IU]

## 2023-02-25 MED ORDER — BORTEZOMIB CHEMO SQ INJECTION 3.5 MG (2.5MG/ML)
1.3000 mg/m2 | Freq: Once | INTRAMUSCULAR | Status: AC
  Administered 2023-02-25: 2.75 mg via SUBCUTANEOUS
  Filled 2023-02-25: qty 1.1

## 2023-02-25 MED ORDER — DEXAMETHASONE 4 MG PO TABS
20.0000 mg | ORAL_TABLET | Freq: Once | ORAL | Status: AC
  Administered 2023-02-25: 20 mg via ORAL
  Filled 2023-02-25: qty 5

## 2023-02-25 MED ORDER — SODIUM CHLORIDE 0.9 % IV SOLN: Freq: Once | INTRAVENOUS | Status: DC

## 2023-02-25 MED ORDER — LENALIDOMIDE 25 MG PO CAPS
25.0000 mg | ORAL_CAPSULE | Freq: Every day | ORAL | 0 refills | Status: DC
Start: 2023-02-25 — End: 2023-07-24

## 2023-02-25 MED ORDER — SODIUM CHLORIDE 0.9 % IV SOLN
20.0000 mg | Freq: Once | INTRAVENOUS | Status: DC
  Filled 2023-02-25: qty 2

## 2023-02-25 MED ORDER — SODIUM CHLORIDE 0.9 % IV SOLN: Freq: Once | INTRAVENOUS | Status: AC

## 2023-02-25 MED ORDER — ZOLEDRONIC ACID 4 MG/100ML IV SOLN
4.0000 mg | Freq: Once | INTRAVENOUS | Status: AC
  Administered 2023-02-25: 4 mg via INTRAVENOUS
  Filled 2023-02-25: qty 100

## 2023-02-25 MED ORDER — SODIUM CHLORIDE 0.9% FLUSH
10.0000 mL | Freq: Once | INTRAVENOUS | Status: AC
  Administered 2023-02-25: 10 mL

## 2023-02-25 NOTE — Progress Notes (Unsigned)
[facility]       Telephone: [phone]?Fax: [phone]   Oncology Clinical Pharmacist Practitioner Progress Note  Kara Medina was contacted via in-person visit to discuss her chemotherapy regimen for Vrd which they receive under the care of Dr. Rachel Moulds.   Interval History She met with [facility] yesterday and then plan is to still give Vrd today (02/25/23), Friday (02/28/23), and next Tuesday (03/04/23) per Dr. Rosaria Ferries instructions at [facility]. She will then hold therapy starting 03/05/23. She will be restarting lenalidomide tomorrow for 7 days (last dose will be 03/04/23). Last dose given with last cycle was 02/18/23. Pregnancy test negative today. She will have labs and treatment this Friday. She will then see Dr. Pamelia Hoit with labs on 03/04/23.  Response to Therapy Kara Medina says she is tolerating treatment very well. Her calcium was a little low today but she continues to take calcium carbonate BID with vitamin D daily. We gave her some written information on calcium and vitamin d options she could entertain in her diet. Hemoglobin was estimated at 9.8 g/dL. She is asymptomatic at this time. We said she could consider increasing her vitamin D to 2000 international units daily. Labs, vitals, treatment parameters, and manufacturer guidelines assessing toxicity were reviewed with Kara Medina today. Based on these values, patient is in agreement to continue therapy at this time.   Allergies Allergies  Allergen Reactions   Amoxicillin Hives    Has patient had a PCN reaction causing immediate rash, facial/tongue/throat swelling, SOB or lightheadedness with hypotension: Yes Has patient had a PCN reaction causing severe rash involving mucus membranes or skin necrosis: No Has patient had a PCN reaction that required hospitalization No Has patient had a PCN reaction occurring within the last 10 years: No If all of the above answers are "NO", then may  proceed with Cephalosporin use.     Vitals    02/25/2023    2:02 PM 02/13/2023    1:54 PM 02/10/2023   10:24 AM  Oncology Vitals  Weight 116.121 kg 112.492 kg 112.537 kg  Weight (lbs) 256 lbs 248 lbs 248 lbs 2 oz  BMI 41.32 kg/m2 40.03 kg/m2 40.04 kg/m2  Temp 98.1 F (36.7 C) 98.6 F (37 C) 97.9 F (36.6 C)  Pulse Rate 97 98 99  BP 123/82 143/76 149/95  Resp  20 17  SpO2 99 % 100 % 97 %  BSA (m2) 2.33 m2 2.29 m2 2.29 m2    Laboratory Data    Latest Ref Rng & Units 02/25/2023    1:27 PM 02/10/2023   10:09 AM 02/03/2023    9:01 AM  CBC EXTENDED  WBC 4.0 - 10.5 K/uL 6.8  10.4  14.6   RBC 3.87 - 5.11 MIL/uL 3.16  3.41  3.46   Hemoglobin 12.0 - 15.0 g/dL 9.8  29.5  62.1   HCT 30.8 - 46.0 % 29.9  31.8  32.2   Platelets 150 - 400 K/uL 254  211  316   NEUT# 1.7 - 7.7 K/uL 4.1  5.7  9.8   Lymph# 0.7 - 4.0 K/uL 0.9  2.7  3.1        Latest Ref Rng & Units 02/25/2023    1:27 PM 02/10/2023   10:09 AM 02/03/2023    9:01 AM  CMP  Glucose 70 - 99 mg/dL 657  98  846   BUN 6 - 20 mg/dL 15  16  15  Creatinine 0.44 - 1.00 mg/dL 1.61  0.96  0.45   Sodium 135 - 145 mmol/L 137  138  138   Potassium 3.5 - 5.1 mmol/L 3.8  3.3  3.5   Chloride 98 - 111 mmol/L 104  105  107   CO2 22 - 32 mmol/L 30  28  26    Calcium 8.9 - 10.3 mg/dL 8.1  8.5  8.5   Total Protein 6.5 - 8.1 g/dL 6.7  6.8  7.0   Total Bilirubin 0.3 - 1.2 mg/dL 0.4  0.4  0.5   Alkaline Phos 38 - 126 U/L 43  41  41   AST 15 - 41 U/L 11  13  14    ALT 0 - 44 U/L 13  15  14     Adverse Effects Assessment Calcium: as above, she continues with BID calcium carbonate. May consider adding an additional 1000 IUs of vitamin d. Information given on how to supplement via diet as well.  Medication Reconciliation The patient's medication list was reviewed today with the patient? Yes New medications or herbal supplements have recently been started? No  Any medications have been discontinued? No  The medication list was updated and reconciled  based on the patient's most recent medication list in the electronic medical record (EMR) including herbal products and OTC medications.   Medications Current Outpatient Medications  Medication Sig Dispense Refill   acyclovir (ZOVIRAX) 200 MG capsule Take 200 mg by mouth 2 (two) times daily.     amLODipine (NORVASC) 5 MG tablet Take 1 tablet (5 mg total) by mouth daily. 90 tablet 3   aspirin EC 81 MG tablet Take 81 mg by mouth daily. Swallow whole.     Blood Pressure Monitoring (BLOOD PRESSURE CUFF) MISC 1 Device by Does not apply route daily. 1 each 0   calcium carbonate (TUMS EX) 750 MG chewable tablet Chew 2 tablets by mouth 2 (two) times daily.     cyclobenzaprine (FLEXERIL) 5 MG tablet Take 1 tablet (5 mg total) by mouth 3 (three) times daily as needed for muscle spasms. 30 tablet 0   dexamethasone (DECADRON) 4 MG tablet Take by mouth.     folic acid-vitamin b complex-vitamin c-selenium-zinc (DIALYVITE) 3 MG TABS tablet Take 1 tablet by mouth daily.     ibuprofen (ADVIL) 200 MG tablet Take 400 mg by mouth every 6 (six) hours as needed for headache or moderate pain.     labetalol (NORMODYNE) 200 MG tablet Take 1 tablet (200 mg total) by mouth 2 (two) times daily. 180 tablet 3   lenalidomide (REVLIMID) 25 MG capsule Take 1 capsule (25 mg total) by mouth daily. Take for 14 days, hold for 7 days. Repeat every 21 days. 14 capsule 0   lidocaine-prilocaine (EMLA) cream Apply topically once.     ondansetron (ZOFRAN) 8 MG tablet      traMADol (ULTRAM) 50 MG tablet Take 1 tablet (50 mg total) by mouth every 12 (twelve) hours as needed. 30 tablet 0   No current facility-administered medications for this visit.    Drug-Drug Interactions (DDIs) DDIs were evaluated? Yes Significant DDIs? Yes , possible increase in VTE with dexamethasone and lenalidomide. Benefit outweighs risk and she does take aspirin as preventative The patient was instructed to speak with their health care provider and/or the  oral chemotherapy pharmacist before starting any new drug, including prescription or over the counter, natural / herbal products, or vitamins.  Supportive Care She will continue vitamin D and calcium  carbonate Potassium slightly low at last visit, now WNL.  Dosing Assessment Hepatic adjustments needed? No  Renal adjustments needed? No  Toxicity adjustments needed? No  The current dosing regimen is appropriate to continue at this time.  Follow-Up Plan Continue with cycle 6, day 1 today of Vrd. Daratumumab has been removed per instructions from Dr. Rosaria Ferries at [facility]. She will restart lenalidomide tomorrow for 7 days. No more treatment starting 03/05/23. Reviewed with Dr. Pamelia Hoit as Dr. Al Pimple is out of office until next week. Zoledronic acid was due so this was given. Okay to treat with current calcium level per Dr. Pamelia Hoit. She will have labs and Vrd day 4 on Friday tentatively. This has been scheduled. She will have labs and see Dr. Pamelia Hoit for Vrd day 8 next Tuesday tentatively. This has been scheduled. She will continue to follow with [facility] BMT Kara Medina can follow up with clinical pharmacy as needed going forward.  Kara Medina participated in the discussion, expressed understanding, and voiced agreement with the above plan. All questions were answered to her satisfaction. The patient was advised to contact the clinic at [phone] with any questions or concerns prior to her return visit.   I spent 30 minutes assessing and educating the patient.   A. Odetta Pink, PharmD, BCOP, CPP  Anselm Lis, RPH-CPP, 02/25/2023  2:32 PM  **Disclaimer: This note was dictated with voice recognition software. Similar sounding words can inadvertently be transcribed and this note may contain transcription errors which may not have been corrected upon publication of note.**

## 2023-02-25 NOTE — Patient Instructions (Signed)
[facility] [place] [facility]  Discharge Instructions: Thank you for choosing [facility] to provide your oncology and hematology care.   If you have a lab appointment with the Cancer Center, please go directly to the Cancer Center and check in at the registration area.   Wear comfortable clothing and clothing appropriate for easy access to any Portacath or PICC line.   We strive to give you quality time with your provider. You may need to reschedule your appointment if you arrive late (15 or more minutes).  Arriving late affects you and other patients whose appointments are after yours.  Also, if you miss three or more appointments without notifying the office, you may be dismissed from the clinic at the provider's discretion.      For prescription refill requests, have your pharmacy contact our office and allow 72 hours for refills to be completed.    Today you received the following chemotherapy and/or immunotherapy agents : Velcade      To help prevent nausea and vomiting after your treatment, we encourage you to take your nausea medication as directed.  BELOW ARE SYMPTOMS THAT SHOULD BE REPORTED IMMEDIATELY: *FEVER GREATER THAN 100.4 F (38 C) OR HIGHER *CHILLS OR SWEATING *NAUSEA AND VOMITING THAT IS NOT CONTROLLED WITH YOUR NAUSEA MEDICATION *UNUSUAL SHORTNESS OF BREATH *UNUSUAL BRUISING OR BLEEDING *URINARY PROBLEMS (pain or burning when urinating, or frequent urination) *BOWEL PROBLEMS (unusual diarrhea, constipation, pain near the anus) TENDERNESS IN MOUTH AND THROAT WITH OR WITHOUT PRESENCE OF ULCERS (sore throat, sores in mouth, or a toothache) UNUSUAL RASH, SWELLING OR PAIN  UNUSUAL VAGINAL DISCHARGE OR ITCHING   Items with * indicate a potential emergency and should be followed up as soon as possible or go to the Emergency Department if any problems should occur.  Please show the CHEMOTHERAPY ALERT CARD or IMMUNOTHERAPY ALERT CARD at  check-in to the Emergency Department and triage nurse.  Should you have questions after your visit or need to cancel or reschedule your appointment, please contact [place] [facility]  Dept: [phone]  and follow the prompts.  Office hours are 8:00 a.m. to 4:30 p.m. Monday - Friday. Please note that voicemails left after 4:00 p.m. may not be returned until the following business day.  We are closed weekends and major holidays. You have access to a nurse at all times for urgent questions. Please call the main number to the clinic Dept: [phone] and follow the prompts.   For any non-urgent questions, you may also contact your provider using MyChart. We now offer e-Visits for anyone 110 and older to request care online for non-urgent symptoms. For details visit [email or web address].   Also download the MyChart app! Go to the app store, search "MyChart", open the app, select , and log in with your MyChart username and password.  Zoledronic Acid Injection (Cancer) What is this medication? ZOLEDRONIC ACID (ZOE le dron ik AS id) treats high calcium levels in the blood caused by cancer. It may also be used with chemotherapy to treat weakened bones caused by cancer. It works by slowing down the release of calcium from bones. This lowers calcium levels in your blood. It also makes your bones stronger and less likely to break (fracture). It belongs to a group of medications called bisphosphonates. This medicine may be used for other purposes; ask your health care provider or pharmacist if you have questions. COMMON BRAND NAME(S): Zometa, Zometa Powder What  should I tell my care team before I take this medication? They need to know if you have any of these conditions: Dehydration Dental disease Kidney disease Liver disease Low levels of calcium in the blood Lung or breathing disease, such as asthma Receiving steroids, such as dexamethasone or prednisone An  unusual or allergic reaction to zoledronic acid, other medications, foods, dyes, or preservatives Pregnant or trying to get pregnant Breast-feeding How should I use this medication? This medication is injected into a vein. It is given by your care team in a hospital or clinic setting. Talk to your care team about the use of this medication in children. Special care may be needed. Overdosage: If you think you have taken too much of this medicine contact a poison control center or emergency room at once. NOTE: This medicine is only for you. Do not share this medicine with others. What if I miss a dose? Keep appointments for follow-up doses. It is important not to miss your dose. Call your care team if you are unable to keep an appointment. What may interact with this medication? Certain antibiotics given by injection Diuretics, such as bumetanide, furosemide NSAIDs, medications for pain and inflammation, such as ibuprofen or naproxen Teriparatide Thalidomide This list may not describe all possible interactions. Give your health care provider a list of all the medicines, herbs, non-prescription drugs, or dietary supplements you use. Also tell them if you smoke, drink alcohol, or use illegal drugs. Some items may interact with your medicine. What should I watch for while using this medication? Visit your care team for regular checks on your progress. It may be some time before you see the benefit from this medication. Some people who take this medication have severe bone, joint, or muscle pain. This medication may also increase your risk for jaw problems or a broken thigh bone. Tell your care team right away if you have severe pain in your jaw, bones, joints, or muscles. Tell you care team if you have any pain that does not go away or that gets worse. Tell your dentist and dental surgeon that you are taking this medication. You should not have major dental surgery while on this medication. See your  dentist to have a dental exam and fix any dental problems before starting this medication. Take good care of your teeth while on this medication. Make sure you see your dentist for regular follow-up appointments. You should make sure you get enough calcium and vitamin D while you are taking this medication. Discuss the foods you eat and the vitamins you take with your care team. Check with your care team if you have severe diarrhea, nausea, and vomiting, or if you sweat a lot. The loss of too much body fluid may make it dangerous for you to take this medication. You may need bloodwork while taking this medication. Talk to your care team if you wish to become pregnant or think you might be pregnant. This medication can cause serious birth defects. What side effects may I notice from receiving this medication? Side effects that you should report to your care team as soon as possible: Allergic reactions--skin rash, itching, hives, swelling of the face, lips, tongue, or throat Kidney injury--decrease in the amount of urine, swelling of the ankles, hands, or feet Low calcium level--muscle pain or cramps, confusion, tingling, or numbness in the hands or feet Osteonecrosis of the jaw--pain, swelling, or redness in the mouth, numbness of the jaw, poor healing after dental work, unusual  discharge from the mouth, visible bones in the mouth Severe bone, joint, or muscle pain Side effects that usually do not require medical attention (report to your care team if they continue or are bothersome): Constipation Fatigue Fever Loss of appetite Nausea Stomach pain This list may not describe all possible side effects. Call your doctor for medical advice about side effects. You may report side effects to FDA at 1-800-FDA-1088. Where should I keep my medication? This medication is given in a hospital or clinic. It will not be stored at home. NOTE: This sheet is a summary. It may not cover all possible information.  If you have questions about this medicine, talk to your doctor, pharmacist, or health care provider.  2024 Elsevier/Gold Standard (2021-08-24 00:00:00)

## 2023-02-25 NOTE — Progress Notes (Signed)
Per Dr. Pamelia Hoit, OK to administer Zometa with corrected calcium 8.58.  Patient denies any recent dental pain, surgeries, procedures.

## 2023-02-26 ENCOUNTER — Encounter: Payer: Self-pay | Admitting: Hematology and Oncology

## 2023-02-27 ENCOUNTER — Other Ambulatory Visit: Payer: Self-pay

## 2023-02-27 ENCOUNTER — Ambulatory Visit: Payer: 59

## 2023-02-27 ENCOUNTER — Other Ambulatory Visit: Payer: 59

## 2023-02-28 ENCOUNTER — Other Ambulatory Visit: Payer: Self-pay

## 2023-02-28 ENCOUNTER — Inpatient Hospital Stay: Payer: 59

## 2023-02-28 VITALS — BP 125/95 | HR 99 | Temp 99.9°F | Resp 18 | Wt 256.2 lb

## 2023-02-28 DIAGNOSIS — Z95828 Presence of other vascular implants and grafts: Secondary | ICD-10-CM

## 2023-02-28 DIAGNOSIS — Z5112 Encounter for antineoplastic immunotherapy: Secondary | ICD-10-CM | POA: Diagnosis not present

## 2023-02-28 DIAGNOSIS — D472 Monoclonal gammopathy: Secondary | ICD-10-CM

## 2023-02-28 DIAGNOSIS — C9000 Multiple myeloma not having achieved remission: Secondary | ICD-10-CM

## 2023-02-28 LAB — CMP (CANCER CENTER ONLY)
ALT: 13 U/L (ref 0–44)
AST: 13 U/L — ABNORMAL LOW (ref 15–41)
Albumin: 3.3 g/dL — ABNORMAL LOW (ref 3.5–5.0)
Alkaline Phosphatase: 38 U/L (ref 38–126)
Anion gap: 3 — ABNORMAL LOW (ref 5–15)
BUN: 15 mg/dL (ref 6–20)
CO2: 28 mmol/L (ref 22–32)
Calcium: 7.9 mg/dL — ABNORMAL LOW (ref 8.9–10.3)
Chloride: 102 mmol/L (ref 98–111)
Creatinine: 0.68 mg/dL (ref 0.44–1.00)
GFR, Estimated: >60 mL/min (ref >60)
Glucose, Bld: 88 mg/dL (ref 70–99)
Potassium: 3.7 mmol/L (ref 3.5–5.1)
Sodium: 133 mmol/L — ABNORMAL LOW (ref 135–145)
Total Bilirubin: 0.7 mg/dL (ref 0.3–1.2)
Total Protein: 7 g/dL (ref 6.5–8.1)

## 2023-02-28 LAB — CBC WITH DIFFERENTIAL (CANCER CENTER ONLY)
Abs Immature Granulocytes: 0.08 10*3/uL — ABNORMAL HIGH (ref 0.00–0.07)
Basophils Absolute: 0 10*3/uL (ref 0.0–0.1)
Basophils Relative: 1 %
Eosinophils Absolute: 0.1 10*3/uL (ref 0.0–0.5)
Eosinophils Relative: 1 %
HCT: 31.2 % — ABNORMAL LOW (ref 36.0–46.0)
Hemoglobin: 10.1 g/dL — ABNORMAL LOW (ref 12.0–15.0)
Immature Granulocytes: 1 %
Lymphocytes Relative: 12 %
Lymphs Abs: 0.8 10*3/uL (ref 0.7–4.0)
MCH: 30.6 pg (ref 26.0–34.0)
MCHC: 32.4 g/dL (ref 30.0–36.0)
MCV: 94.5 fL (ref 80.0–100.0)
Monocytes Absolute: 1.6 10*3/uL — ABNORMAL HIGH (ref 0.1–1.0)
Monocytes Relative: 25 %
Neutro Abs: 3.8 10*3/uL (ref 1.7–7.7)
Neutrophils Relative %: 60 %
Platelet Count: 217 10*3/uL (ref 150–400)
RBC: 3.3 MIL/uL — ABNORMAL LOW (ref 3.87–5.11)
RDW: 15.9 % — ABNORMAL HIGH (ref 11.5–15.5)
WBC Count: 6.3 10*3/uL (ref 4.0–10.5)
nRBC: 0 % (ref 0.0–0.2)

## 2023-02-28 MED ORDER — BORTEZOMIB CHEMO SQ INJECTION 3.5 MG (2.5MG/ML)
1.3000 mg/m2 | Freq: Once | INTRAMUSCULAR | Status: AC
  Administered 2023-02-28: 2.75 mg via SUBCUTANEOUS
  Filled 2023-02-28: qty 1.1

## 2023-02-28 MED ORDER — SODIUM CHLORIDE 0.9% FLUSH
10.0000 mL | Freq: Once | INTRAVENOUS | Status: AC
  Administered 2023-02-28: 10 mL

## 2023-02-28 MED ORDER — PROCHLORPERAZINE MALEATE 10 MG PO TABS
10.0000 mg | ORAL_TABLET | Freq: Once | ORAL | Status: AC
  Administered 2023-02-28: 10 mg via ORAL
  Filled 2023-02-28: qty 1

## 2023-02-28 MED ORDER — HEPARIN SOD (PORK) LOCK FLUSH 100 UNIT/ML IV SOLN
500.0000 [IU] | Freq: Once | INTRAVENOUS | Status: AC
  Administered 2023-02-28: 500 [IU]

## 2023-02-28 NOTE — Progress Notes (Signed)
Patient here for her Velcade and she had some symptoms she was unsure of- plus a temperature of 100.0 F. She had some wooziness/nausea in her stomach, extra tired.  Her temperature recheck showed 100.1 F. She did restart her 25mg  revlimid yesterday. Discussed with Lillard Anes, NP and Mena Pauls, RN. It was decided that the symptom management PA- Daphane Shepherd would assess her and make sure she is appropriate for treatment. Daphane Shepherd, PA released her for treatment and Lillard Anes was in agreement.

## 2023-02-28 NOTE — Patient Instructions (Signed)
[facility] [place] [facility]  Discharge Instructions: Thank you for choosing [facility] to provide your oncology and hematology care.   If you have a lab appointment with the Cancer Center, please go directly to the Cancer Center and check in at the registration area.   Wear comfortable clothing and clothing appropriate for easy access to any Portacath or PICC line.   We strive to give you quality time with your provider. You may need to reschedule your appointment if you arrive late (15 or more minutes).  Arriving late affects you and other patients whose appointments are after yours.  Also, if you miss three or more appointments without notifying the office, you may be dismissed from the clinic at the provider's discretion.      For prescription refill requests, have your pharmacy contact our office and allow 72 hours for refills to be completed.    Today you received the following chemotherapy and/or immunotherapy agents : Velcade      To help prevent nausea and vomiting after your treatment, we encourage you to take your nausea medication as directed.  BELOW ARE SYMPTOMS THAT SHOULD BE REPORTED IMMEDIATELY: *FEVER GREATER THAN 100.4 F (38 C) OR HIGHER *CHILLS OR SWEATING *NAUSEA AND VOMITING THAT IS NOT CONTROLLED WITH YOUR NAUSEA MEDICATION *UNUSUAL SHORTNESS OF BREATH *UNUSUAL BRUISING OR BLEEDING *URINARY PROBLEMS (pain or burning when urinating, or frequent urination) *BOWEL PROBLEMS (unusual diarrhea, constipation, pain near the anus) TENDERNESS IN MOUTH AND THROAT WITH OR WITHOUT PRESENCE OF ULCERS (sore throat, sores in mouth, or a toothache) UNUSUAL RASH, SWELLING OR PAIN  UNUSUAL VAGINAL DISCHARGE OR ITCHING   Items with * indicate a potential emergency and should be followed up as soon as possible or go to the Emergency Department if any problems should occur.  Please show the CHEMOTHERAPY ALERT CARD or IMMUNOTHERAPY ALERT CARD at  check-in to the Emergency Department and triage nurse.  Should you have questions after your visit or need to cancel or reschedule your appointment, please contact [place] [facility]  Dept: [phone]  and follow the prompts.  Office hours are 8:00 a.m. to 4:30 p.m. Monday - Friday. Please note that voicemails left after 4:00 p.m. may not be returned until the following business day.  We are closed weekends and major holidays. You have access to a nurse at all times for urgent questions. Please call the main number to the clinic Dept: [phone] and follow the prompts.   For any non-urgent questions, you may also contact your provider using MyChart. We now offer e-Visits for anyone 110 and older to request care online for non-urgent symptoms. For details visit [email or web address].   Also download the MyChart app! Go to the app store, search "MyChart", open the app, select , and log in with your MyChart username and password.  Zoledronic Acid Injection (Cancer) What is this medication? ZOLEDRONIC ACID (ZOE le dron ik AS id) treats high calcium levels in the blood caused by cancer. It may also be used with chemotherapy to treat weakened bones caused by cancer. It works by slowing down the release of calcium from bones. This lowers calcium levels in your blood. It also makes your bones stronger and less likely to break (fracture). It belongs to a group of medications called bisphosphonates. This medicine may be used for other purposes; ask your health care provider or pharmacist if you have questions. COMMON BRAND NAME(S): Zometa, Zometa Powder What  should I tell my care team before I take this medication? They need to know if you have any of these conditions: Dehydration Dental disease Kidney disease Liver disease Low levels of calcium in the blood Lung or breathing disease, such as asthma Receiving steroids, such as dexamethasone or prednisone An  unusual or allergic reaction to zoledronic acid, other medications, foods, dyes, or preservatives Pregnant or trying to get pregnant Breast-feeding How should I use this medication? This medication is injected into a vein. It is given by your care team in a hospital or clinic setting. Talk to your care team about the use of this medication in children. Special care may be needed. Overdosage: If you think you have taken too much of this medicine contact a poison control center or emergency room at once. NOTE: This medicine is only for you. Do not share this medicine with others. What if I miss a dose? Keep appointments for follow-up doses. It is important not to miss your dose. Call your care team if you are unable to keep an appointment. What may interact with this medication? Certain antibiotics given by injection Diuretics, such as bumetanide, furosemide NSAIDs, medications for pain and inflammation, such as ibuprofen or naproxen Teriparatide Thalidomide This list may not describe all possible interactions. Give your health care provider a list of all the medicines, herbs, non-prescription drugs, or dietary supplements you use. Also tell them if you smoke, drink alcohol, or use illegal drugs. Some items may interact with your medicine. What should I watch for while using this medication? Visit your care team for regular checks on your progress. It may be some time before you see the benefit from this medication. Some people who take this medication have severe bone, joint, or muscle pain. This medication may also increase your risk for jaw problems or a broken thigh bone. Tell your care team right away if you have severe pain in your jaw, bones, joints, or muscles. Tell you care team if you have any pain that does not go away or that gets worse. Tell your dentist and dental surgeon that you are taking this medication. You should not have major dental surgery while on this medication. See your  dentist to have a dental exam and fix any dental problems before starting this medication. Take good care of your teeth while on this medication. Make sure you see your dentist for regular follow-up appointments. You should make sure you get enough calcium and vitamin D while you are taking this medication. Discuss the foods you eat and the vitamins you take with your care team. Check with your care team if you have severe diarrhea, nausea, and vomiting, or if you sweat a lot. The loss of too much body fluid may make it dangerous for you to take this medication. You may need bloodwork while taking this medication. Talk to your care team if you wish to become pregnant or think you might be pregnant. This medication can cause serious birth defects. What side effects may I notice from receiving this medication? Side effects that you should report to your care team as soon as possible: Allergic reactions--skin rash, itching, hives, swelling of the face, lips, tongue, or throat Kidney injury--decrease in the amount of urine, swelling of the ankles, hands, or feet Low calcium level--muscle pain or cramps, confusion, tingling, or numbness in the hands or feet Osteonecrosis of the jaw--pain, swelling, or redness in the mouth, numbness of the jaw, poor healing after dental work, unusual  discharge from the mouth, visible bones in the mouth Severe bone, joint, or muscle pain Side effects that usually do not require medical attention (report to your care team if they continue or are bothersome): Constipation Fatigue Fever Loss of appetite Nausea Stomach pain This list may not describe all possible side effects. Call your doctor for medical advice about side effects. You may report side effects to FDA at 1-800-FDA-1088. Where should I keep my medication? This medication is given in a hospital or clinic. It will not be stored at home. NOTE: This sheet is a summary. It may not cover all possible information.  If you have questions about this medicine, talk to your doctor, pharmacist, or health care provider.  2024 Elsevier/Gold Standard (2021-08-24 00:00:00)

## 2023-03-03 ENCOUNTER — Ambulatory Visit: Payer: 59 | Admitting: Adult Health

## 2023-03-03 ENCOUNTER — Ambulatory Visit: Payer: 59

## 2023-03-03 ENCOUNTER — Other Ambulatory Visit: Payer: 59

## 2023-03-04 ENCOUNTER — Inpatient Hospital Stay: Payer: 59 | Admitting: Hematology and Oncology

## 2023-03-04 ENCOUNTER — Inpatient Hospital Stay: Payer: 59

## 2023-03-04 ENCOUNTER — Other Ambulatory Visit: Payer: Self-pay | Admitting: Hematology and Oncology

## 2023-03-04 ENCOUNTER — Encounter: Payer: Self-pay | Admitting: *Deleted

## 2023-03-04 ENCOUNTER — Other Ambulatory Visit: Payer: Self-pay

## 2023-03-04 DIAGNOSIS — D649 Anemia, unspecified: Secondary | ICD-10-CM

## 2023-03-04 DIAGNOSIS — D472 Monoclonal gammopathy: Secondary | ICD-10-CM

## 2023-03-04 DIAGNOSIS — C9000 Multiple myeloma not having achieved remission: Secondary | ICD-10-CM

## 2023-03-04 DIAGNOSIS — Z95828 Presence of other vascular implants and grafts: Secondary | ICD-10-CM

## 2023-03-04 DIAGNOSIS — Z5112 Encounter for antineoplastic immunotherapy: Secondary | ICD-10-CM | POA: Diagnosis not present

## 2023-03-04 LAB — CBC WITH DIFFERENTIAL (CANCER CENTER ONLY)
Abs Immature Granulocytes: 0.29 10*3/uL — ABNORMAL HIGH (ref 0.00–0.07)
Basophils Absolute: 0.1 10*3/uL (ref 0.0–0.1)
Basophils Relative: 1 %
Eosinophils Absolute: 0.1 10*3/uL (ref 0.0–0.5)
Eosinophils Relative: 2 %
HCT: 28.8 % — ABNORMAL LOW (ref 36.0–46.0)
Hemoglobin: 9.8 g/dL — ABNORMAL LOW (ref 12.0–15.0)
Immature Granulocytes: 5 %
Lymphocytes Relative: 21 %
Lymphs Abs: 1.1 10*3/uL (ref 0.7–4.0)
MCH: 31.3 pg (ref 26.0–34.0)
MCHC: 34 g/dL (ref 30.0–36.0)
MCV: 92 fL (ref 80.0–100.0)
Monocytes Absolute: 0.6 10*3/uL (ref 0.1–1.0)
Monocytes Relative: 12 %
Neutro Abs: 3.2 10*3/uL (ref 1.7–7.7)
Neutrophils Relative %: 59 %
Platelet Count: 135 10*3/uL — ABNORMAL LOW (ref 150–400)
RBC: 3.13 MIL/uL — ABNORMAL LOW (ref 3.87–5.11)
RDW: 15.9 % — ABNORMAL HIGH (ref 11.5–15.5)
WBC Count: 5.4 10*3/uL (ref 4.0–10.5)
nRBC: 0 % (ref 0.0–0.2)

## 2023-03-04 LAB — COMPREHENSIVE METABOLIC PANEL
ALT: 13 U/L (ref 0–44)
AST: 17 U/L (ref 15–41)
Albumin: 3.3 g/dL — ABNORMAL LOW (ref 3.5–5.0)
Alkaline Phosphatase: 39 U/L (ref 38–126)
Anion gap: 5 (ref 5–15)
BUN: 17 mg/dL (ref 6–20)
CO2: 29 mmol/L (ref 22–32)
Calcium: 8.6 mg/dL — ABNORMAL LOW (ref 8.9–10.3)
Chloride: 100 mmol/L (ref 98–111)
Creatinine, Ser: 0.63 mg/dL (ref 0.44–1.00)
GFR, Estimated: >60 mL/min (ref >60)
Glucose, Bld: 119 mg/dL — ABNORMAL HIGH (ref 70–99)
Potassium: 3.2 mmol/L — ABNORMAL LOW (ref 3.5–5.1)
Sodium: 134 mmol/L — ABNORMAL LOW (ref 135–145)
Total Bilirubin: 0.4 mg/dL (ref 0.3–1.2)
Total Protein: 7.1 g/dL (ref 6.5–8.1)

## 2023-03-04 MED ORDER — SODIUM CHLORIDE 0.9% FLUSH
10.0000 mL | Freq: Once | INTRAVENOUS | Status: AC
  Administered 2023-03-04: 10 mL

## 2023-03-04 MED ORDER — BORTEZOMIB CHEMO SQ INJECTION 3.5 MG (2.5MG/ML)
1.3000 mg/m2 | Freq: Once | INTRAMUSCULAR | Status: AC
  Administered 2023-03-04: 2.75 mg via SUBCUTANEOUS
  Filled 2023-03-04: qty 1.1

## 2023-03-04 MED ORDER — DEXAMETHASONE 4 MG PO TABS
20.0000 mg | ORAL_TABLET | Freq: Once | ORAL | Status: AC
  Administered 2023-03-04: 20 mg via ORAL
  Filled 2023-03-04: qty 5

## 2023-03-04 MED ORDER — PROCHLORPERAZINE MALEATE 10 MG PO TABS
10.0000 mg | ORAL_TABLET | Freq: Once | ORAL | Status: AC
  Administered 2023-03-04: 10 mg via ORAL
  Filled 2023-03-04: qty 1

## 2023-03-04 NOTE — Assessment & Plan Note (Signed)
This is a very pleasant 47 year old female patient with a past medical history significant for gestational diabetes and trigeminal neuralgia corrected with surgery, hypertension referred to hematology for evaluation and recommendations regarding severe normocytic normochromic anemia.   BMB confirmed Ig G kappa MM, bone survey pending scheduled for Wednesday. FISH for multiple myeloma showed a gain of chromosome 11, characterized as standard risk.   Cytogenetics showed normal female karyotype RISS-II, median survival 42 months.  We started her on daratumumab with Velcade and dexamethasone prior to her FISH results being available. She started cycle 1 day 1 of Darzalex/Velcade with dexamethasone on 09/20/2022.  From second cycle, we transitioned to VRD since she is a standard risk. [facility] transplant team suggested we go back on Dara VRD for induction.   She is now here for follow up before planned Velcade and Revlimid.  She will be prepped for transplant, already scheduled for cell harvest.  I have reassured her about the process, went through the expected autotransplant and recovery.  No pertinent ROS or physical exam findings. Ok to proceed with treatment as planned. I will coordinate with Dr Rosaria Ferries regarding the transplant details.  She can return to [place] after transplant.

## 2023-03-04 NOTE — Patient Instructions (Signed)
[facility] [place] [facility]  Discharge Instructions: Thank you for choosing [facility] to provide your oncology and hematology care.   If you have a lab appointment with the Cancer Center, please go directly to the Cancer Center and check in at the registration area.   Wear comfortable clothing and clothing appropriate for easy access to any Portacath or PICC line.   We strive to give you quality time with your provider. You may need to reschedule your appointment if you arrive late (15 or more minutes).  Arriving late affects you and other patients whose appointments are after yours.  Also, if you miss three or more appointments without notifying the office, you may be dismissed from the clinic at the provider's discretion.      For prescription refill requests, have your pharmacy contact our office and allow 72 hours for refills to be completed.    Today you received the following chemotherapy and/or immunotherapy agents : Velcade      To help prevent nausea and vomiting after your treatment, we encourage you to take your nausea medication as directed.  BELOW ARE SYMPTOMS THAT SHOULD BE REPORTED IMMEDIATELY: *FEVER GREATER THAN 100.4 F (38 C) OR HIGHER *CHILLS OR SWEATING *NAUSEA AND VOMITING THAT IS NOT CONTROLLED WITH YOUR NAUSEA MEDICATION *UNUSUAL SHORTNESS OF BREATH *UNUSUAL BRUISING OR BLEEDING *URINARY PROBLEMS (pain or burning when urinating, or frequent urination) *BOWEL PROBLEMS (unusual diarrhea, constipation, pain near the anus) TENDERNESS IN MOUTH AND THROAT WITH OR WITHOUT PRESENCE OF ULCERS (sore throat, sores in mouth, or a toothache) UNUSUAL RASH, SWELLING OR PAIN  UNUSUAL VAGINAL DISCHARGE OR ITCHING   Items with * indicate a potential emergency and should be followed up as soon as possible or go to the Emergency Department if any problems should occur.  Please show the CHEMOTHERAPY ALERT CARD or IMMUNOTHERAPY ALERT CARD at  check-in to the Emergency Department and triage nurse.  Should you have questions after your visit or need to cancel or reschedule your appointment, please contact [place] [facility]  Dept: [phone]  and follow the prompts.  Office hours are 8:00 a.m. to 4:30 p.m. Monday - Friday. Please note that voicemails left after 4:00 p.m. may not be returned until the following business day.  We are closed weekends and major holidays. You have access to a nurse at all times for urgent questions. Please call the main number to the clinic Dept: [phone] and follow the prompts.   For any non-urgent questions, you may also contact your provider using MyChart. We now offer e-Visits for anyone 110 and older to request care online for non-urgent symptoms. For details visit [email or web address].   Also download the MyChart app! Go to the app store, search "MyChart", open the app, select , and log in with your MyChart username and password.  Zoledronic Acid Injection (Cancer) What is this medication? ZOLEDRONIC ACID (ZOE le dron ik AS id) treats high calcium levels in the blood caused by cancer. It may also be used with chemotherapy to treat weakened bones caused by cancer. It works by slowing down the release of calcium from bones. This lowers calcium levels in your blood. It also makes your bones stronger and less likely to break (fracture). It belongs to a group of medications called bisphosphonates. This medicine may be used for other purposes; ask your health care provider or pharmacist if you have questions. COMMON BRAND NAME(S): Zometa, Zometa Powder What  should I tell my care team before I take this medication? They need to know if you have any of these conditions: Dehydration Dental disease Kidney disease Liver disease Low levels of calcium in the blood Lung or breathing disease, such as asthma Receiving steroids, such as dexamethasone or prednisone An  unusual or allergic reaction to zoledronic acid, other medications, foods, dyes, or preservatives Pregnant or trying to get pregnant Breast-feeding How should I use this medication? This medication is injected into a vein. It is given by your care team in a hospital or clinic setting. Talk to your care team about the use of this medication in children. Special care may be needed. Overdosage: If you think you have taken too much of this medicine contact a poison control center or emergency room at once. NOTE: This medicine is only for you. Do not share this medicine with others. What if I miss a dose? Keep appointments for follow-up doses. It is important not to miss your dose. Call your care team if you are unable to keep an appointment. What may interact with this medication? Certain antibiotics given by injection Diuretics, such as bumetanide, furosemide NSAIDs, medications for pain and inflammation, such as ibuprofen or naproxen Teriparatide Thalidomide This list may not describe all possible interactions. Give your health care provider a list of all the medicines, herbs, non-prescription drugs, or dietary supplements you use. Also tell them if you smoke, drink alcohol, or use illegal drugs. Some items may interact with your medicine. What should I watch for while using this medication? Visit your care team for regular checks on your progress. It may be some time before you see the benefit from this medication. Some people who take this medication have severe bone, joint, or muscle pain. This medication may also increase your risk for jaw problems or a broken thigh bone. Tell your care team right away if you have severe pain in your jaw, bones, joints, or muscles. Tell you care team if you have any pain that does not go away or that gets worse. Tell your dentist and dental surgeon that you are taking this medication. You should not have major dental surgery while on this medication. See your  dentist to have a dental exam and fix any dental problems before starting this medication. Take good care of your teeth while on this medication. Make sure you see your dentist for regular follow-up appointments. You should make sure you get enough calcium and vitamin D while you are taking this medication. Discuss the foods you eat and the vitamins you take with your care team. Check with your care team if you have severe diarrhea, nausea, and vomiting, or if you sweat a lot. The loss of too much body fluid may make it dangerous for you to take this medication. You may need bloodwork while taking this medication. Talk to your care team if you wish to become pregnant or think you might be pregnant. This medication can cause serious birth defects. What side effects may I notice from receiving this medication? Side effects that you should report to your care team as soon as possible: Allergic reactions--skin rash, itching, hives, swelling of the face, lips, tongue, or throat Kidney injury--decrease in the amount of urine, swelling of the ankles, hands, or feet Low calcium level--muscle pain or cramps, confusion, tingling, or numbness in the hands or feet Osteonecrosis of the jaw--pain, swelling, or redness in the mouth, numbness of the jaw, poor healing after dental work, unusual  discharge from the mouth, visible bones in the mouth Severe bone, joint, or muscle pain Side effects that usually do not require medical attention (report to your care team if they continue or are bothersome): Constipation Fatigue Fever Loss of appetite Nausea Stomach pain This list may not describe all possible side effects. Call your doctor for medical advice about side effects. You may report side effects to FDA at 1-800-FDA-1088. Where should I keep my medication? This medication is given in a hospital or clinic. It will not be stored at home. NOTE: This sheet is a summary. It may not cover all possible information.  If you have questions about this medicine, talk to your doctor, pharmacist, or health care provider.  2024 Elsevier/Gold Standard (2021-08-24 00:00:00)

## 2023-03-04 NOTE — Progress Notes (Incomplete)
Patient Care Team: [name], MD as PCP - General (Family Medicine) Christell Constant, MD as PCP - Cardiology (Cardiology) Maxie Better, MD as Consulting Physician (Obstetrics and Gynecology)  DIAGNOSIS:  Encounter Diagnoses  Name Primary?   MGUS (monoclonal gammopathy of unknown significance)    Multiple myeloma not having achieved remission (HCC)     SUMMARY OF ONCOLOGIC HISTORY: Oncology History  Multiple myeloma (HCC)  09/12/2022 Initial Diagnosis   Multiple myeloma (HCC)   09/20/2022 - 10/04/2022 Chemotherapy   Patient is on Treatment Plan : MYELOMA NEWLY DIAGNOSED TRANSPLANT CANDIDATE DaraVRd (Daratumumab IV) q21d x 6 Cycles (Induction/Consolidation)     10/11/2022 - 10/22/2022 Chemotherapy   Patient is on Treatment Plan : MYELOMA NON-TRANSPLANT CANDIDATES VRd SQ q21d     11/04/2022 -  Chemotherapy   Patient is on Treatment Plan : MYELOMA NEWLY DIAGNOSED TRANSPLANT CANDIDATE VRd q21d starting cycle 6 (Induction/Consolidation)       CHIEF COMPLIANT:   INTERVAL HISTORY: Kara Medina is here for follow-up.  She will proceed with the transplant next month.  She is already scheduled for cell harvest.  She will stop her Revlimid after today's dose.  She will get Velcade today. She is understandably nervous about transplant.  ALLERGIES:  is allergic to amoxicillin.  MEDICATIONS:  Current Outpatient Medications  Medication Sig Dispense Refill   acyclovir (ZOVIRAX) 200 MG capsule Take 200 mg by mouth 2 (two) times daily.     amLODipine (NORVASC) 5 MG tablet Take 1 tablet (5 mg total) by mouth daily. 90 tablet 3   aspirin EC 81 MG tablet Take 81 mg by mouth daily. Swallow whole.     Blood Pressure Monitoring (BLOOD PRESSURE CUFF) MISC 1 Device by Does not apply route daily. 1 each 0   calcium carbonate (TUMS EX) 750 MG chewable tablet Chew 2 tablets by mouth 2 (two) times daily.     cyclobenzaprine (FLEXERIL) 5 MG tablet Take 1 tablet (5 mg total) by mouth 3  (three) times daily as needed for muscle spasms. 30 tablet 0   dexamethasone (DECADRON) 4 MG tablet Take by mouth.     folic acid-vitamin b complex-vitamin c-selenium-zinc (DIALYVITE) 3 MG TABS tablet Take 1 tablet by mouth daily.     ibuprofen (ADVIL) 200 MG tablet Take 400 mg by mouth every 6 (six) hours as needed for headache or moderate pain.     labetalol (NORMODYNE) 200 MG tablet Take 1 tablet (200 mg total) by mouth 2 (two) times daily. 180 tablet 3   lenalidomide (REVLIMID) 25 MG capsule Take 1 capsule (25 mg total) by mouth daily. Take for 14 days, hold for 7 days. Repeat every 21 days. 14 capsule 0   lidocaine-prilocaine (EMLA) cream Apply topically once.     ondansetron (ZOFRAN) 8 MG tablet      traMADol (ULTRAM) 50 MG tablet Take 1 tablet (50 mg total) by mouth every 12 (twelve) hours as needed. 30 tablet 0   No current facility-administered medications for this visit.    PHYSICAL EXAMINATION: ECOG PERFORMANCE STATUS: 0 - Asymptomatic  Vitals:   03/04/23 1346  BP: 130/79  Pulse: 87  Resp: 18  Temp: 97.9 F (36.6 C)  SpO2: 95%   Filed Weights   03/04/23 1346  Weight: 247 lb (112 kg)    She appeared well, alert oriented and in no acute distress, no lower extremity swelling.  LABORATORY DATA:  I have reviewed the data as listed    Latest Ref Rng &  Units 03/04/2023    1:13 PM 02/28/2023    2:26 PM 02/25/2023    1:27 PM  CMP  Glucose 70 - 99 mg/dL 952  88  841   BUN 6 - 20 mg/dL 17  15  15    Creatinine 0.44 - 1.00 mg/dL 3.24  4.01  0.27   Sodium 135 - 145 mmol/L 134  133  137   Potassium 3.5 - 5.1 mmol/L 3.2  3.7  3.8   Chloride 98 - 111 mmol/L 100  102  104   CO2 22 - 32 mmol/L 29  28  30    Calcium 8.9 - 10.3 mg/dL 8.6  7.9  8.1   Total Protein 6.5 - 8.1 g/dL 7.1  7.0  6.7   Total Bilirubin 0.3 - 1.2 mg/dL 0.4  0.7  0.4   Alkaline Phos 38 - 126 U/L 39  38  43   AST 15 - 41 U/L 17  13  11    ALT 0 - 44 U/L 13  13  13      Lab Results  Component Value Date    WBC 5.4 03/04/2023   HGB 9.8 (L) 03/04/2023   HCT 28.8 (L) 03/04/2023   MCV 92.0 03/04/2023   PLT 135 (L) 03/04/2023   NEUTROABS 3.2 03/04/2023    ASSESSMENT & PLAN:  Multiple myeloma (HCC) This is a very pleasant 47 year old female patient with a past medical history significant for gestational diabetes and trigeminal neuralgia corrected with surgery, hypertension referred to hematology for evaluation and recommendations regarding severe normocytic normochromic anemia.   BMB confirmed Ig G kappa MM, bone survey pending scheduled for Wednesday. FISH for multiple myeloma showed a gain of chromosome 11, characterized as standard risk.   Cytogenetics showed normal female karyotype RISS-II, median survival 42 months.  We started her on daratumumab with Velcade and dexamethasone prior to her FISH results being available. She started cycle 1 day 1 of Darzalex/Velcade with dexamethasone on 09/20/2022.  From second cycle, we transitioned to VRD since she is a standard risk. [facility] transplant team suggested we go back on Dara VRD for induction.   She is now here for follow up before planned Velcade and Revlimid.  She will be prepped for transplant, already scheduled for cell harvest.  I have reassured her about the process, went through the expected autotransplant and recovery.  No pertinent ROS or physical exam findings. Ok to proceed with treatment as planned. I will coordinate with Dr Rosaria Ferries regarding the transplant details.  She can return to [place] after transplant.    No orders of the defined types were placed in this encounter.  The patient has a good understanding of the overall plan. she agrees with it. she will call with any problems that may develop before the next visit here. Total time spent: 30 mins including face to face time and time spent for planning, charting and co-ordination of care   Kara Moulds, MD 03/04/23

## 2023-03-06 ENCOUNTER — Encounter: Payer: Self-pay | Admitting: Hematology and Oncology

## 2023-03-06 NOTE — Progress Notes (Signed)
[facility] Follow up:    Kara Saint, MD [address] [place] [address]   DIAGNOSIS: Multiple Myeloma  SUMMARY OF ONCOLOGIC HISTORY: Oncology History  Multiple myeloma (HCC)  09/12/2022 Initial Diagnosis   Multiple myeloma (HCC)   09/20/2022 - 10/04/2022 Chemotherapy   Patient is on Treatment Plan : MYELOMA NEWLY DIAGNOSED TRANSPLANT CANDIDATE DaraVRd (Daratumumab IV) q21d x 6 Cycles (Induction/Consolidation)     10/11/2022 - 10/22/2022 Chemotherapy   Patient is on Treatment Plan : MYELOMA NON-TRANSPLANT CANDIDATES VRd SQ q21d     11/04/2022 -  Chemotherapy   Patient is on Treatment Plan : MYELOMA NEWLY DIAGNOSED TRANSPLANT CANDIDATE VRd q21d starting cycle 6 (Induction/Consolidation)       CURRENT THERAPY: DaraVRD  INTERVAL HISTORY:  Kara Medina 47 y.o. female returns for f/u and evaluation. She will proceed with VRD as instructed by the transplant team and will be preparing for the cell harvest early September and autotransplant later on.  She is understandably very nervous about this. Rest of the pertinent 10 point ROS reviewed and negative  Patient Active Problem List   Diagnosis Date Noted   Family history of mixed hyperlipidemia 01/30/2023   Obstructive sleep apnea 01/30/2023   Port-A-Cath in place 01/06/2023   HTN (hypertension) 11/14/2022   Multiple myeloma (HCC) 09/12/2022   MGUS (monoclonal gammopathy of unknown significance) 09/10/2022   Hypertensive urgency 08/29/2022   Iron deficiency anemia 08/29/2022   LVH (left ventricular hypertrophy) 08/29/2022   Normocytic anemia 08/29/2022    is allergic to amoxicillin.  MEDICAL HISTORY: Past Medical History:  Diagnosis Date   Anemia    Gestational diabetes    diet controlled   Postpartum care following vaginal delivery (7/27) 02/09/2016   Pregnancy induced hypertension    Trigeminal neuralgia    2012 corrected with surgery    SURGICAL HISTORY: Past Surgical  History:  Procedure Laterality Date   APPENDECTOMY  2008   IR IMAGING GUIDED PORT INSERTION  12/03/2022   TRIGEMINAL NERVE DECOMPRESSION  2012    SOCIAL HISTORY: Social History   Socioeconomic History   Marital status: Married    Spouse name: Not on file   Number of children: Not on file   Years of education: Not on file   Highest education level: Bachelor's degree (e.g., BA, AB, BS)  Occupational History   Not on file  Tobacco Use   Smoking status: Never   Smokeless tobacco: Never  Vaping Use   Vaping status: Never Used  Substance and Sexual Activity   Alcohol use: No   Drug use: No   Sexual activity: Yes  Other Topics Concern   Not on file  Social History Narrative   Not on file   Social Determinants of Health   Financial Resource Strain: Low Risk  (08/23/2022)   Overall Financial Resource Strain (CARDIA)    Difficulty of Paying Living Expenses: Not hard at all  Food Insecurity: No Food Insecurity (08/23/2022)   Hunger Vital Sign    Worried About Running Out of Food in the Last Year: Never true    Ran Out of Food in the Last Year: Never true  Transportation Needs: No Transportation Needs (08/23/2022)   PRAPARE - Administrator, Civil Service (Medical): No    Lack of Transportation (Non-Medical): No  Physical Activity: Insufficiently Active (08/23/2022)   Exercise Vital Sign    Days of Exercise per Week: 2 days    Minutes of Exercise per  Session: 30 min  Stress: Stress Concern Present (08/23/2022)   [employer] of Occupational Health - Occupational Stress Questionnaire    Feeling of Stress : To some extent  Social Connections: Unknown (08/23/2022)   Social Connection and Isolation Panel [NHANES]    Frequency of Communication with Friends and Family: More than three times a week    Frequency of Social Gatherings with Friends and Family: Once a week    Attends Religious Services: Patient declined    Database administrator or Organizations: Yes    Attends  Engineer, structural: More than 4 times per year    Marital Status: Married  Catering manager Violence: Not on file    FAMILY HISTORY: Family History  Problem Relation Age of Onset   Cancer Other    Hypertension Other    Healthy Father    Cancer Paternal Grandfather        stomach, lung   Hypertension Mother    Breast cancer Mother 74   Hypertension Maternal Grandmother    Arthritis Maternal Grandmother    Hypertension Maternal Grandfather    Other Brother        ?prediabetic   Diabetes Paternal Grandmother    Hypertension Paternal Grandmother    Autism Son     Review of Systems  Constitutional:  Positive for fatigue. Negative for appetite change, chills, fever and unexpected weight change.  HENT:   Negative for hearing loss, lump/mass and trouble swallowing.   Eyes:  Negative for eye problems and icterus.  Respiratory:  Negative for chest tightness, cough and shortness of breath.   Cardiovascular:  Negative for chest pain, leg swelling and palpitations.  Gastrointestinal:  Negative for abdominal distention, abdominal pain, constipation, diarrhea, nausea and vomiting.  Endocrine: Negative for hot flashes.  Genitourinary:  Negative for difficulty urinating.   Musculoskeletal:  Negative for arthralgias.  Skin:  Negative for itching and rash.  Neurological:  Negative for dizziness, extremity weakness, headaches and numbness.  Hematological:  Negative for adenopathy. Does not bruise/bleed easily.  Psychiatric/Behavioral:  Negative for depression. The patient is not nervous/anxious.       PHYSICAL EXAMINATION    Vitals:   03/04/23 1346  BP: 130/79  Pulse: 87  Resp: 18  Temp: 97.9 F (36.6 C)  SpO2: 95%    Physical Exam Constitutional:      General: She is not in acute distress.    Appearance: Normal appearance. She is not toxic-appearing.  HENT:     Head: Normocephalic and atraumatic.     Mouth/Throat:     Mouth: Mucous membranes are moist.      Pharynx: Oropharynx is clear. No oropharyngeal exudate or posterior oropharyngeal erythema.  Eyes:     General: No scleral icterus. Cardiovascular:     Rate and Rhythm: Normal rate and regular rhythm.     Pulses: Normal pulses.     Heart sounds: Normal heart sounds.  Pulmonary:     Effort: Pulmonary effort is normal.     Breath sounds: Normal breath sounds.  Abdominal:     General: Abdomen is flat. Bowel sounds are normal. There is no distension.     Palpations: Abdomen is soft.     Tenderness: There is no abdominal tenderness.  Musculoskeletal:        General: No swelling.     Cervical back: Neck supple.  Lymphadenopathy:     Cervical: No cervical adenopathy.  Skin:    General: Skin is warm and dry.  Findings: No rash.  Neurological:     General: No focal deficit present.     Mental Status: She is alert.  Psychiatric:        Mood and Affect: Mood normal.        Behavior: Behavior normal.     LABORATORY DATA:  CBC    Component Value Date/Time   WBC 5.4 03/04/2023 1313   WBC 7.9 12/23/2022 0905   RBC 3.13 (L) 03/04/2023 1313   HGB 9.8 (L) 03/04/2023 1313   HCT 28.8 (L) 03/04/2023 1313   HCT 24.4 (L) 09/04/2022 1037   PLT 135 (L) 03/04/2023 1313   MCV 92.0 03/04/2023 1313   MCH 31.3 03/04/2023 1313   MCHC 34.0 03/04/2023 1313   RDW 15.9 (H) 03/04/2023 1313   LYMPHSABS 1.1 03/04/2023 1313   MONOABS 0.6 03/04/2023 1313   EOSABS 0.1 03/04/2023 1313   BASOSABS 0.1 03/04/2023 1313    CMP     Component Value Date/Time   NA 134 (L) 03/04/2023 1313   K 3.2 (L) 03/04/2023 1313   CL 100 03/04/2023 1313   CO2 29 03/04/2023 1313   GLUCOSE 119 (H) 03/04/2023 1313   BUN 17 03/04/2023 1313   CREATININE 0.63 03/04/2023 1313   CREATININE 0.68 02/28/2023 1426   CREATININE 0.95 08/23/2022 1631   CALCIUM 8.6 (L) 03/04/2023 1313   PROT 7.1 03/04/2023 1313   ALBUMIN 3.3 (L) 03/04/2023 1313   AST 17 03/04/2023 1313   AST 13 (L) 02/28/2023 1426   ALT 13 03/04/2023 1313    ALT 13 02/28/2023 1426   ALKPHOS 39 03/04/2023 1313   BILITOT 0.4 03/04/2023 1313   BILITOT 0.7 02/28/2023 1426   GFRNONAA >60 03/04/2023 1313   GFRNONAA >60 02/28/2023 1426   GFRAA >60 02/09/2016 0525         ASSESSMENT and THERAPY PLAN:   Multiple myeloma (HCC) This is a very pleasant 47 year old female patient with a past medical history significant for gestational diabetes and trigeminal neuralgia corrected with surgery, hypertension referred to hematology for evaluation and recommendations regarding severe normocytic normochromic anemia.   BMB confirmed Ig G kappa MM, bone survey pending scheduled for Wednesday. FISH for multiple myeloma showed a gain of chromosome 11, characterized as standard risk.   Cytogenetics showed normal female karyotype RISS-II, median survival 42 months.  We started her on daratumumab with Velcade and dexamethasone prior to her FISH results being available. She started cycle 1 day 1 of Darzalex/Velcade with dexamethasone on 09/20/2022.  From second cycle, we transitioned to VRD since she is a standard risk. [facility] transplant team suggested we go back on Dara VRD for induction.   She is now here for follow up before planned Velcade and Revlimid.  She will be prepped for transplant, already scheduled for cell harvest.  I have reassured her about the process, went through the expected autotransplant and recovery.  No pertinent ROS or physical exam findings. Ok to proceed with treatment as planned. I will coordinate with Dr Rosaria Ferries regarding the transplant details.  She can return to [place] after transplant.     All questions were answered. The patient knows to call the clinic with any problems, questions or concerns. We can certainly see the patient much sooner if necessary.  Total encounter time:30 minutes*in face-to-face visit time, chart review, lab review, care coordination, order entry, and documentation of the encounter time.   *Total Encounter Time as  defined by the Centers for Medicare and Medicaid Services includes, in  addition to the face-to-face time of a patient visit (documented in the note above) non-face-to-face time: obtaining and reviewing outside history, ordering and reviewing medications, tests or procedures, care coordination (communications with other health care professionals or caregivers) and documentation in the medical record.

## 2023-04-08 ENCOUNTER — Other Ambulatory Visit: Payer: Self-pay

## 2023-04-09 ENCOUNTER — Other Ambulatory Visit: Payer: Self-pay | Admitting: *Deleted

## 2023-04-09 DIAGNOSIS — C9000 Multiple myeloma not having achieved remission: Secondary | ICD-10-CM

## 2023-04-11 ENCOUNTER — Inpatient Hospital Stay: Payer: 59 | Attending: Hematology and Oncology

## 2023-04-11 DIAGNOSIS — C9000 Multiple myeloma not having achieved remission: Secondary | ICD-10-CM | POA: Insufficient documentation

## 2023-04-11 LAB — CBC WITH DIFFERENTIAL (CANCER CENTER ONLY)
Abs Immature Granulocytes: 0.44 10*3/uL — ABNORMAL HIGH (ref 0.00–0.07)
Basophils Absolute: 0 10*3/uL (ref 0.0–0.1)
Basophils Relative: 1 %
Eosinophils Absolute: 0 10*3/uL (ref 0.0–0.5)
Eosinophils Relative: 0 %
HCT: 27 % — ABNORMAL LOW (ref 36.0–46.0)
Hemoglobin: 8.8 g/dL — ABNORMAL LOW (ref 12.0–15.0)
Immature Granulocytes: 10 %
Lymphocytes Relative: 21 %
Lymphs Abs: 0.9 10*3/uL (ref 0.7–4.0)
MCH: 31.5 pg (ref 26.0–34.0)
MCHC: 32.6 g/dL (ref 30.0–36.0)
MCV: 96.8 fL (ref 80.0–100.0)
Monocytes Absolute: 1.5 10*3/uL — ABNORMAL HIGH (ref 0.1–1.0)
Monocytes Relative: 36 %
Neutro Abs: 1.3 10*3/uL — ABNORMAL LOW (ref 1.7–7.7)
Neutrophils Relative %: 32 %
Platelet Count: 177 10*3/uL (ref 150–400)
RBC: 2.79 MIL/uL — ABNORMAL LOW (ref 3.87–5.11)
RDW: 16.9 % — ABNORMAL HIGH (ref 11.5–15.5)
Smear Review: NORMAL
WBC Count: 4.3 10*3/uL (ref 4.0–10.5)
nRBC: 1.6 % — ABNORMAL HIGH (ref 0.0–0.2)

## 2023-04-11 LAB — CMP (CANCER CENTER ONLY)
ALT: 14 U/L (ref 0–44)
AST: 19 U/L (ref 15–41)
Albumin: 3.8 g/dL (ref 3.5–5.0)
Alkaline Phosphatase: 54 U/L (ref 38–126)
Anion gap: 4 — ABNORMAL LOW (ref 5–15)
BUN: 12 mg/dL (ref 6–20)
CO2: 32 mmol/L (ref 22–32)
Calcium: 9.5 mg/dL (ref 8.9–10.3)
Chloride: 106 mmol/L (ref 98–111)
Creatinine: 0.84 mg/dL (ref 0.44–1.00)
GFR, Estimated: >60 mL/min (ref >60)
Glucose, Bld: 118 mg/dL — ABNORMAL HIGH (ref 70–99)
Potassium: 4 mmol/L (ref 3.5–5.1)
Sodium: 142 mmol/L (ref 135–145)
Total Bilirubin: 0.3 mg/dL (ref 0.3–1.2)
Total Protein: 6.6 g/dL (ref 6.5–8.1)

## 2023-04-11 LAB — MAGNESIUM: Magnesium: 1.5 mg/dL — ABNORMAL LOW (ref 1.7–2.4)

## 2023-05-08 ENCOUNTER — Encounter: Payer: Self-pay | Admitting: *Deleted

## 2023-05-08 NOTE — Progress Notes (Signed)
PATIENT: Kara Medina DOB: 09-Jul-1976  REASON FOR VISIT: follow up HISTORY FROM: patient PRIMARY NEUROLOGIST: Dr. Frances Furbish  Virtual Visit via Video Note  I connected with Kara Medina on 05/09/23 at  8:45 AM EDT by a video enabled telemedicine application located remotely at [facility] and verified that I am speaking with the correct person using two identifiers who was located at their own home.   I discussed the limitations of evaluation and management by telemedicine and the availability of in person appointments. The patient expressed understanding and agreed to proceed.   PATIENT: Kara Medina DOB: 1976-07-08  REASON FOR VISIT: follow up HISTORY FROM: patient  HISTORY OF PRESENT ILLNESS: Today 05/09/23:  Kara Medina is a 47 y.o. female with a history of obstructive sleep apnea on CPAP. Returns today for follow-up.  This is her initial CPAP compliance visit.  She states that unfortunately she has not used it consistently due to cancer treatments.  She was undergoing chemotherapy and had a stem cell transplant.  She states that now that that is behind her she will start using the CPAP consistently.  Her download is below       HISTORY: Kara Medina is a 47 year old female with an underlying medical history of anemia, multiple myeloma on chemotherapy, left ventricular hypertrophy, hypertension, sleep apnea, and obesity, who reports a longstanding history of bilateral hand tremors, essentially since she was in college.  Her tremors were mild in the beginning and have plateaued, not necessarily interfering with her day-to-day activities but they have become worse over time especially in the past year and particularly since she started chemotherapy recently for her multiple myeloma.  Chemotherapy has been successful thus far, she started chemo in March 2024 and is on a multidrug regimen, chemotherapy will be completed in August and then she will proceed  with autologous bone marrow transplant as I understand, this will be done at [facility]  She does and does quite a bit of stress.   She is not aware of any family history of tremors, her parents are alive, neither 1 have tremors, she has a younger brother and a younger sister, neither one of her siblings have tremors.  She has a total of 4 children, 40 year old, 57 year old twins and 9-year-old and none of her kids have tremors.  She lives with her family including husband and children.  She works full-time for [employer].  Her tremor interferes with her handwriting, she reports difficulty holding drinks in particular and spelling, difficulty with eating.  She has not been on medication for tremors but incidentally, she is on a beta-blocker currently.  She does not drink caffeine daily, she does not drink any alcohol, she sleeps fairly.  She snores, she has significant nocturia about 2-3 times per average night actually was diagnosed several years ago with sleep apnea with a sleep study but did not pursue CPAP therapy at the time.  Her Epworth sleepiness score is 16 out of 24, fatigue severity score is 40 out of 63. I reviewed your office visit note from 12/02/2022.  She has regular blood work particularly CBC and CMP and regular checkup on her myeloma panel.  She had a vitamin B12 check on 09/04/2022, it was about 556.  TSH on 08/29/2022 was normal at 1.35. She had a recent head CT without contrast through [facility] emergency room on 10/21/2022 with indication of head trauma, lightheaded, syncope.  I reviewed the results: Impression:  Negative noncontrasted CT appearance of the brain.  Acute on chronic sinusitis.    She had presented to the emergency room on 10/21/2022 with fatigue and an episode of near syncope.  REVIEW OF SYSTEMS: Out of a complete 14 system review of symptoms, the patient complains only of the following symptoms, and all other reviewed systems are  negative.  ALLERGIES: Allergies  Allergen Reactions   Amoxicillin Hives    Has patient had a PCN reaction causing immediate rash, facial/tongue/throat swelling, SOB or lightheadedness with hypotension: Yes Has patient had a PCN reaction causing severe rash involving mucus membranes or skin necrosis: No Has patient had a PCN reaction that required hospitalization No Has patient had a PCN reaction occurring within the last 10 years: No If all of the above answers are "NO", then may proceed with Cephalosporin use.     HOME MEDICATIONS: Outpatient Medications Prior to Visit  Medication Sig Dispense Refill   acyclovir (ZOVIRAX) 200 MG capsule Take 200 mg by mouth 2 (two) times daily.     amLODipine (NORVASC) 5 MG tablet Take 1 tablet (5 mg total) by mouth daily. 90 tablet 3   aspirin EC 81 MG tablet Take 81 mg by mouth daily. Swallow whole.     Blood Pressure Monitoring (BLOOD PRESSURE CUFF) MISC 1 Device by Does not apply route daily. 1 each 0   calcium carbonate (TUMS EX) 750 MG chewable tablet Chew 2 tablets by mouth 2 (two) times daily.     cyclobenzaprine (FLEXERIL) 5 MG tablet Take 1 tablet (5 mg total) by mouth 3 (three) times daily as needed for muscle spasms. 30 tablet 0   dexamethasone (DECADRON) 4 MG tablet Take by mouth.     folic acid-vitamin b complex-vitamin c-selenium-zinc (DIALYVITE) 3 MG TABS tablet Take 1 tablet by mouth daily.     ibuprofen (ADVIL) 200 MG tablet Take 400 mg by mouth every 6 (six) hours as needed for headache or moderate pain.     labetalol (NORMODYNE) 200 MG tablet Take 1 tablet (200 mg total) by mouth 2 (two) times daily. 180 tablet 3   lenalidomide (REVLIMID) 25 MG capsule Take 1 capsule (25 mg total) by mouth daily. Take for 14 days, hold for 7 days. Repeat every 21 days. 14 capsule 0   lidocaine-prilocaine (EMLA) cream Apply topically once.     ondansetron (ZOFRAN) 8 MG tablet      traMADol (ULTRAM) 50 MG tablet Take 1 tablet (50 mg total) by mouth  every 12 (twelve) hours as needed. 30 tablet 0   No facility-administered medications prior to visit.    PAST MEDICAL HISTORY: Past Medical History:  Diagnosis Date   Anemia    Gestational diabetes    diet controlled   Postpartum care following vaginal delivery (7/27) 02/09/2016   Pregnancy induced hypertension    Trigeminal neuralgia    2012 corrected with surgery    PAST SURGICAL HISTORY: Past Surgical History:  Procedure Laterality Date   APPENDECTOMY  2008   IR IMAGING GUIDED PORT INSERTION  12/03/2022   TRIGEMINAL NERVE DECOMPRESSION  2012    FAMILY HISTORY: Family History  Problem Relation Age of Onset   Cancer Other    Hypertension Other    Healthy Father    Cancer Paternal Grandfather        stomach, lung   Hypertension Mother    Breast cancer Mother 44   Hypertension Maternal Grandmother    Arthritis Maternal Grandmother    Hypertension Maternal Grandfather  Other Brother        ?prediabetic   Diabetes Paternal Grandmother    Hypertension Paternal Grandmother    Autism Son     SOCIAL HISTORY: Social History   Socioeconomic History   Marital status: Married    Spouse name: Not on file   Number of children: Not on file   Years of education: Not on file   Highest education level: Bachelor's degree (e.g., BA, AB, BS)  Occupational History   Not on file  Tobacco Use   Smoking status: Never   Smokeless tobacco: Never  Vaping Use   Vaping status: Never Used  Substance and Sexual Activity   Alcohol use: No   Drug use: No   Sexual activity: Yes  Other Topics Concern   Not on file  Social History Narrative   Not on file   Social Determinants of Health   Financial Resource Strain: Low Risk  (08/23/2022)   Overall Financial Resource Strain (CARDIA)    Difficulty of Paying Living Expenses: Not hard at all  Food Insecurity: No Food Insecurity (08/23/2022)   Hunger Vital Sign    Worried About Running Out of Food in the Last Year: Never true    Ran  Out of Food in the Last Year: Never true  Transportation Needs: No Transportation Needs (08/23/2022)   PRAPARE - Administrator, Civil Service (Medical): No    Lack of Transportation (Non-Medical): No  Physical Activity: Insufficiently Active (08/23/2022)   Exercise Vital Sign    Days of Exercise per Week: 2 days    Minutes of Exercise per Session: 30 min  Stress: Stress Concern Present (08/23/2022)   [employer] of Occupational Health - Occupational Stress Questionnaire    Feeling of Stress : To some extent  Social Connections: Unknown (08/23/2022)   Social Connection and Isolation Panel [NHANES]    Frequency of Communication with Friends and Family: More than three times a week    Frequency of Social Gatherings with Friends and Family: Once a week    Attends Religious Services: Patient declined    Database administrator or Organizations: Yes    Attends Engineer, structural: More than 4 times per year    Marital Status: Married  Catering manager Violence: Not on file      PHYSICAL EXAM Generalized: Well developed, in no acute distress   Neurological examination  Mentation: Alert oriented to time, place, history taking. Follows all commands speech and language fluent Cranial nerve II-XII: Facial symmetry noted  DIAGNOSTIC DATA (LABS, IMAGING, TESTING) - I reviewed patient records, labs, notes, testing and imaging myself where available.  Lab Results  Component Value Date   WBC 4.3 04/11/2023   HGB 8.8 (L) 04/11/2023   HCT 27.0 (L) 04/11/2023   MCV 96.8 04/11/2023   PLT 177 04/11/2023      Component Value Date/Time   NA 142 04/11/2023 1013   K 4.0 04/11/2023 1013   CL 106 04/11/2023 1013   CO2 32 04/11/2023 1013   GLUCOSE 118 (H) 04/11/2023 1013   BUN 12 04/11/2023 1013   CREATININE 0.84 04/11/2023 1013   CREATININE 0.95 08/23/2022 1631   CALCIUM 9.5 04/11/2023 1013   PROT 6.6 04/11/2023 1013   ALBUMIN 3.8 04/11/2023 1013   AST 19 04/11/2023  1013   ALT 14 04/11/2023 1013   ALKPHOS 54 04/11/2023 1013   BILITOT 0.3 04/11/2023 1013   GFRNONAA >60 04/11/2023 1013   GFRAA >60 02/09/2016 0525  Lab Results  Component Value Date   CHOL 105 08/22/2021   HDL 36.50 (L) 08/22/2021   LDLCALC 60 08/22/2021   LDLDIRECT 76 01/30/2023   TRIG 42.0 08/22/2021   CHOLHDL 3 08/22/2021   Lab Results  Component Value Date   HGBA1C 4.9 08/22/2021   Lab Results  Component Value Date   VITAMINB12 556 09/04/2022   Lab Results  Component Value Date   TSH 1.35 08/29/2022      ASSESSMENT AND PLAN 47 y.o. year old female  has a past medical history of Anemia, Gestational diabetes, Postpartum care following vaginal delivery (7/27) (02/09/2016), Pregnancy induced hypertension, and Trigeminal neuralgia. here with:  OSA on CPAP  CPAP compliance suboptimal Residual AHI is good when she uses CPAP Encouraged patient to restart CPAP therapy and use greater than 4 hours each night F/U in 3 to 4 months or sooner if needed   Butch Penny, MSN, NP-C 05/09/2023, 8:43 AM [facility] [address], [address] [place], [place] [address] [phone]

## 2023-05-09 ENCOUNTER — Telehealth (INDEPENDENT_AMBULATORY_CARE_PROVIDER_SITE_OTHER): Payer: 59 | Admitting: Adult Health

## 2023-05-09 DIAGNOSIS — G4733 Obstructive sleep apnea (adult) (pediatric): Secondary | ICD-10-CM | POA: Diagnosis not present

## 2023-05-10 ENCOUNTER — Other Ambulatory Visit: Payer: Self-pay

## 2023-05-30 ENCOUNTER — Encounter: Payer: Self-pay | Admitting: Hematology and Oncology

## 2023-05-30 ENCOUNTER — Inpatient Hospital Stay: Payer: 59

## 2023-05-30 ENCOUNTER — Inpatient Hospital Stay: Payer: 59 | Attending: Hematology and Oncology | Admitting: Hematology and Oncology

## 2023-05-30 VITALS — BP 133/88 | HR 98 | Temp 98.4°F | Resp 16 | Wt 238.3 lb

## 2023-05-30 DIAGNOSIS — Z800 Family history of malignant neoplasm of digestive organs: Secondary | ICD-10-CM | POA: Diagnosis not present

## 2023-05-30 DIAGNOSIS — Z801 Family history of malignant neoplasm of trachea, bronchus and lung: Secondary | ICD-10-CM | POA: Diagnosis not present

## 2023-05-30 DIAGNOSIS — Z803 Family history of malignant neoplasm of breast: Secondary | ICD-10-CM | POA: Insufficient documentation

## 2023-05-30 DIAGNOSIS — D649 Anemia, unspecified: Secondary | ICD-10-CM

## 2023-05-30 DIAGNOSIS — C9001 Multiple myeloma in remission: Secondary | ICD-10-CM

## 2023-05-30 DIAGNOSIS — R251 Tremor, unspecified: Secondary | ICD-10-CM | POA: Diagnosis not present

## 2023-05-30 DIAGNOSIS — E669 Obesity, unspecified: Secondary | ICD-10-CM | POA: Diagnosis not present

## 2023-05-30 DIAGNOSIS — Z9484 Stem cells transplant status: Secondary | ICD-10-CM | POA: Diagnosis not present

## 2023-05-30 DIAGNOSIS — C9000 Multiple myeloma not having achieved remission: Secondary | ICD-10-CM | POA: Diagnosis present

## 2023-05-30 LAB — CBC WITH DIFFERENTIAL/PLATELET
Abs Immature Granulocytes: 0.01 10*3/uL (ref 0.00–0.07)
Basophils Absolute: 0 10*3/uL (ref 0.0–0.1)
Basophils Relative: 1 %
Eosinophils Absolute: 0.2 10*3/uL (ref 0.0–0.5)
Eosinophils Relative: 4 %
HCT: 33.3 % — ABNORMAL LOW (ref 36.0–46.0)
Hemoglobin: 10.8 g/dL — ABNORMAL LOW (ref 12.0–15.0)
Immature Granulocytes: 0 %
Lymphocytes Relative: 33 %
Lymphs Abs: 1.1 10*3/uL (ref 0.7–4.0)
MCH: 30.8 pg (ref 26.0–34.0)
MCHC: 32.4 g/dL (ref 30.0–36.0)
MCV: 94.9 fL (ref 80.0–100.0)
Monocytes Absolute: 0.6 10*3/uL (ref 0.1–1.0)
Monocytes Relative: 17 %
Neutro Abs: 1.5 10*3/uL — ABNORMAL LOW (ref 1.7–7.7)
Neutrophils Relative %: 45 %
Platelets: 194 10*3/uL (ref 150–400)
RBC: 3.51 MIL/uL — ABNORMAL LOW (ref 3.87–5.11)
RDW: 12.8 % (ref 11.5–15.5)
WBC: 3.4 10*3/uL — ABNORMAL LOW (ref 4.0–10.5)
nRBC: 0 % (ref 0.0–0.2)

## 2023-05-30 LAB — COMPREHENSIVE METABOLIC PANEL
ALT: 11 U/L (ref 0–44)
AST: 17 U/L (ref 15–41)
Albumin: 4 g/dL (ref 3.5–5.0)
Alkaline Phosphatase: 41 U/L (ref 38–126)
Anion gap: 4 — ABNORMAL LOW (ref 5–15)
BUN: 15 mg/dL (ref 6–20)
CO2: 28 mmol/L (ref 22–32)
Calcium: 9.5 mg/dL (ref 8.9–10.3)
Chloride: 108 mmol/L (ref 98–111)
Creatinine, Ser: 0.84 mg/dL (ref 0.44–1.00)
GFR, Estimated: >60 mL/min (ref >60)
Glucose, Bld: 77 mg/dL (ref 70–99)
Potassium: 4.3 mmol/L (ref 3.5–5.1)
Sodium: 140 mmol/L (ref 135–145)
Total Bilirubin: 0.4 mg/dL (ref <1.2)
Total Protein: 6.9 g/dL (ref 6.5–8.1)

## 2023-05-30 NOTE — Progress Notes (Signed)
[facility] Cancer Follow up:    Kara Saint, MD [address] [place] [address]   DIAGNOSIS: Multiple Myeloma  SUMMARY OF ONCOLOGIC HISTORY: Oncology History  Multiple myeloma (HCC)  09/12/2022 Initial Diagnosis   Multiple myeloma (HCC)   09/20/2022 - 10/04/2022 Chemotherapy   Patient is on Treatment Plan : MYELOMA NEWLY DIAGNOSED TRANSPLANT CANDIDATE DaraVRd (Daratumumab IV) q21d x 6 Cycles (Induction/Consolidation)     10/11/2022 - 10/22/2022 Chemotherapy   Patient is on Treatment Plan : MYELOMA NON-TRANSPLANT CANDIDATES VRd SQ q21d     11/04/2022 - 03/04/2023 Chemotherapy   Patient is on Treatment Plan : MYELOMA NEWLY DIAGNOSED TRANSPLANT CANDIDATE VRd q21d starting cycle 6 (Induction/Consolidation)      ASBMT risk stratification: Low Risk, PR1 ECOG/KPS: 0/90% HSCT-CI score: 2 (Obesity, Age >40) Preparative regimen/treatment protocol: Melphalan 200 mg/m2   03/26/2023 - Bone Marrow Transplant  AUTOLOGOUS STEM CELL TRANSPLANT: Disease status at time of transplant: PR1 ASBMT risk stratification: low risk, ECOG/KPS: 1 / 90%, HSCT-CI score: 2 Mobilization was done with G-CSF and Mozobil with a yield of 2.104 x 10(7) CD34+ cells.  Preparative regimen with Melphalan 200 mg/m2 on 03/25/2023 (outpatient). Infusion of stem cells - 7.01 x 10(6) on 03/26/2023 (outpatient) Completed stem cell process 04/08/2023. Course minimally complicated by mucositis.   ENGRAFTMENT: WBC engraftment on 04/06/2023 and platelet engraftment on 04/11/2023 at [facility]. Last platelet transfusion given 04/04/2023.  04/04/2023 - Supportive Treatment  Adult OP Blood Administration PRN x 30 DAYS Plan Provider: Reece Levy, MD   CURRENT THERAPY: DaraVRD  INTERVAL HISTORY:  Kara Medina 47 y.o. female returns for f/u and evaluation.  Since her last visit here she had autologous transplant on 03/26/2023.  She had no posttransplant complications, recovering well  now on acyclovir and Bactrim double strength prophylaxis.  She had her day 30 visit at [facility], she is now here for day 60 visit.  She only reports some hyperpigmentation of her nailbeds otherwise has been recovering very well.  She lost her hair because of melphalan.  No change in breathing.  No unusual fatigue.  No change in bowel habits.  No trouble urinating or new neurological complaints.   Rest of the pertinent 10 point ROS reviewed and negative  Patient Active Problem List   Diagnosis Date Noted   Family history of mixed hyperlipidemia 01/30/2023   Obstructive sleep apnea 01/30/2023   Port-A-Cath in place 01/06/2023   HTN (hypertension) 11/14/2022   Multiple myeloma (HCC) 09/12/2022   MGUS (monoclonal gammopathy of unknown significance) 09/10/2022   Hypertensive urgency 08/29/2022   Iron deficiency anemia 08/29/2022   LVH (left ventricular hypertrophy) 08/29/2022   Normocytic anemia 08/29/2022    is allergic to amoxicillin.  MEDICAL HISTORY: Past Medical History:  Diagnosis Date   Anemia    Gestational diabetes    diet controlled   Postpartum care following vaginal delivery (7/27) 02/09/2016   Pregnancy induced hypertension    Trigeminal neuralgia    2012 corrected with surgery    SURGICAL HISTORY: Past Surgical History:  Procedure Laterality Date   APPENDECTOMY  2008   IR IMAGING GUIDED PORT INSERTION  12/03/2022   TRIGEMINAL NERVE DECOMPRESSION  2012    SOCIAL HISTORY: Social History   Socioeconomic History   Marital status: Married    Spouse name: Not on file   Number of children: Not on file   Years of education: Not on file   Highest education level: Bachelor's degree (e.g., BA, AB,  BS)  Occupational History   Not on file  Tobacco Use   Smoking status: Never   Smokeless tobacco: Never  Vaping Use   Vaping status: Never Used  Substance and Sexual Activity   Alcohol use: No   Drug use: No   Sexual activity: Yes  Other Topics Concern   Not on file   Social History Narrative   Not on file   Social Determinants of Health   Financial Resource Strain: Low Risk  (08/23/2022)   Overall Financial Resource Strain (CARDIA)    Difficulty of Paying Living Expenses: Not hard at all  Food Insecurity: No Food Insecurity (08/23/2022)   Hunger Vital Sign    Worried About Running Out of Food in the Last Year: Never true    Ran Out of Food in the Last Year: Never true  Transportation Needs: No Transportation Needs (08/23/2022)   PRAPARE - Administrator, Civil Service (Medical): No    Lack of Transportation (Non-Medical): No  Physical Activity: Insufficiently Active (08/23/2022)   Exercise Vital Sign    Days of Exercise per Week: 2 days    Minutes of Exercise per Session: 30 min  Stress: Stress Concern Present (08/23/2022)   [employer] of Occupational Health - Occupational Stress Questionnaire    Feeling of Stress : To some extent  Social Connections: Unknown (08/23/2022)   Social Connection and Isolation Panel [NHANES]    Frequency of Communication with Friends and Family: More than three times a week    Frequency of Social Gatherings with Friends and Family: Once a week    Attends Religious Services: Patient declined    Database administrator or Organizations: Yes    Attends Engineer, structural: More than 4 times per year    Marital Status: Married  Catering manager Violence: Not on file    FAMILY HISTORY: Family History  Problem Relation Age of Onset   Cancer Other    Hypertension Other    Healthy Father    Cancer Paternal Grandfather        stomach, lung   Hypertension Mother    Breast cancer Mother 29   Hypertension Maternal Grandmother    Arthritis Maternal Grandmother    Hypertension Maternal Grandfather    Other Brother        ?prediabetic   Diabetes Paternal Grandmother    Hypertension Paternal Grandmother    Autism Son     Review of Systems  Constitutional:  Positive for fatigue. Negative for  appetite change, chills, fever and unexpected weight change.  HENT:   Negative for hearing loss, lump/mass and trouble swallowing.   Eyes:  Negative for eye problems and icterus.  Respiratory:  Negative for chest tightness, cough and shortness of breath.   Cardiovascular:  Negative for chest pain, leg swelling and palpitations.  Gastrointestinal:  Negative for abdominal distention, abdominal pain, constipation, diarrhea, nausea and vomiting.  Endocrine: Negative for hot flashes.  Genitourinary:  Negative for difficulty urinating.   Musculoskeletal:  Negative for arthralgias.  Skin:  Negative for itching and rash.  Neurological:  Negative for dizziness, extremity weakness, headaches and numbness.  Hematological:  Negative for adenopathy. Does not bruise/bleed easily.  Psychiatric/Behavioral:  Negative for depression. The patient is not nervous/anxious.       PHYSICAL EXAMINATION    Vitals:   05/30/23 0928  BP: 133/88  Pulse: 98  Resp: 16  Temp: 98.4 F (36.9 C)  SpO2: 100%    Physical Exam  Constitutional:      General: She is not in acute distress.    Appearance: Normal appearance. She is not toxic-appearing.  HENT:     Head: Normocephalic and atraumatic.     Mouth/Throat:     Mouth: Mucous membranes are moist.     Pharynx: Oropharynx is clear. No oropharyngeal exudate or posterior oropharyngeal erythema.  Eyes:     General: No scleral icterus. Cardiovascular:     Rate and Rhythm: Normal rate and regular rhythm.     Pulses: Normal pulses.     Heart sounds: Normal heart sounds.  Pulmonary:     Effort: Pulmonary effort is normal.     Breath sounds: Normal breath sounds.  Abdominal:     General: Abdomen is flat. Bowel sounds are normal. There is no distension.     Palpations: Abdomen is soft.     Tenderness: There is no abdominal tenderness.  Musculoskeletal:        General: No swelling.     Cervical back: Neck supple.  Lymphadenopathy:     Cervical: No cervical  adenopathy.  Skin:    General: Skin is warm and dry.     Findings: No rash.  Neurological:     General: No focal deficit present.     Mental Status: She is alert.  Psychiatric:        Mood and Affect: Mood normal.        Behavior: Behavior normal.     LABORATORY DATA:  CBC    Component Value Date/Time   WBC 3.4 (L) 05/30/2023 0852   RBC 3.51 (L) 05/30/2023 0852   HGB 10.8 (L) 05/30/2023 0852   HGB 8.8 (L) 04/11/2023 1013   HCT 33.3 (L) 05/30/2023 0852   HCT 24.4 (L) 09/04/2022 1037   PLT 194 05/30/2023 0852   PLT 177 04/11/2023 1013   MCV 94.9 05/30/2023 0852   MCH 30.8 05/30/2023 0852   MCHC 32.4 05/30/2023 0852   RDW 12.8 05/30/2023 0852   LYMPHSABS 1.1 05/30/2023 0852   MONOABS 0.6 05/30/2023 0852   EOSABS 0.2 05/30/2023 0852   BASOSABS 0.0 05/30/2023 0852    CMP     Component Value Date/Time   NA 140 05/30/2023 0852   K 4.3 05/30/2023 0852   CL 108 05/30/2023 0852   CO2 28 05/30/2023 0852   GLUCOSE 77 05/30/2023 0852   BUN 15 05/30/2023 0852   CREATININE 0.84 05/30/2023 0852   CREATININE 0.84 04/11/2023 1013   CREATININE 0.95 08/23/2022 1631   CALCIUM 9.5 05/30/2023 0852   PROT 6.9 05/30/2023 0852   ALBUMIN 4.0 05/30/2023 0852   AST 17 05/30/2023 0852   AST 19 04/11/2023 1013   ALT 11 05/30/2023 0852   ALT 14 04/11/2023 1013   ALKPHOS 41 05/30/2023 0852   BILITOT 0.4 05/30/2023 0852   BILITOT 0.3 04/11/2023 1013   GFRNONAA >60 05/30/2023 0852   GFRNONAA >60 04/11/2023 1013   GFRAA >60 02/09/2016 0525   ASSESSMENT and THERAPY PLAN:   Multiple myeloma (HCC) This is a very pleasant 47 year old female patient with a past medical history significant for gestational diabetes and trigeminal neuralgia corrected with surgery, hypertension referred to hematology for evaluation and recommendations regarding severe normocytic normochromic anemia.   BMB confirmed Ig G kappa MM, bone survey pending scheduled for Wednesday. FISH for multiple myeloma showed a  gain of chromosome 11, characterized as standard risk.   Cytogenetics showed normal female karyotype RISS-II, median survival 42 months. She had dara-VRD  for induction.  She had autologous transplant on 03/26/2023 with melphalan conditioning.  She is here for her posttransplant visit.  Post-transplant care Patient is on Acyclovir 800mg  BID and Bactrim DS MWF. Discussed vaccination schedule post-transplant, including COVID-19 revaccination after day 84 per CDC guidelines, and other vaccines 6 months post-transplant. -Continue current medications. -Consider COVID-19 revaccination next month. -Plan for other vaccinations 6 months post-transplant.  Hematologic recovery post-transplant White count is 3.4, improved from last visit. Hemoglobin is 10.8, significantly improved from prior levels. Platelet count is normal. ANC is stable. -Continue to monitor blood counts.  Nail changes Patient reports darkening of nail beds. Discussed potential causes including vitamin deficiencies and chemotherapy effects. -Continue multivitamin daily. -Monitor nail changes.  Intentional tremor Patient reports persistent tremor, particularly when trying to perform tasks. -No intervention needed at this time.  We will restart Zometa per recommendations from [facility], this is to be done every 12 weeks.      All questions were answered. The patient knows to call the clinic with any problems, questions or concerns. We can certainly see the patient much sooner if necessary.  Total encounter time:30 minutes*in face-to-face visit time, chart review, lab review, care coordination, order entry, and documentation of the encounter time.   *Total Encounter Time as defined by the Centers for Medicare and Medicaid Services includes, in addition to the face-to-face time of a patient visit (documented in the note above) non-face-to-face time: obtaining and reviewing outside history, ordering and reviewing medications, tests  or procedures, care coordination (communications with other health care professionals or caregivers) and documentation in the medical record.

## 2023-05-30 NOTE — Assessment & Plan Note (Signed)
This is a very pleasant 47 year old female patient with a past medical history significant for gestational diabetes and trigeminal neuralgia corrected with surgery, hypertension referred to hematology for evaluation and recommendations regarding severe normocytic normochromic anemia.   BMB confirmed Ig G kappa MM, bone survey pending scheduled for Wednesday. FISH for multiple myeloma showed a gain of chromosome 11, characterized as standard risk.   Cytogenetics showed normal female karyotype RISS-II, median survival 42 months. She had dara-VRD for induction.  She had autologous transplant on 03/26/2023 with melphalan conditioning.  She is here for her posttransplant visit.  Post-transplant care Patient is on Acyclovir 800mg  BID and Bactrim DS MWF. Discussed vaccination schedule post-transplant, including COVID-19 revaccination after day 84 per CDC guidelines, and other vaccines 6 months post-transplant. -Continue current medications. -Consider COVID-19 revaccination next month. -Plan for other vaccinations 6 months post-transplant.  Hematologic recovery post-transplant White count is 3.4, improved from last visit. Hemoglobin is 10.8, significantly improved from prior levels. Platelet count is normal. ANC is stable. -Continue to monitor blood counts.  Nail changes Patient reports darkening of nail beds. Discussed potential causes including vitamin deficiencies and chemotherapy effects. -Continue multivitamin daily. -Monitor nail changes.  Intentional tremor Patient reports persistent tremor, particularly when trying to perform tasks. -No intervention needed at this time.  We will restart Zometa per recommendations from [facility], this is to be done every 12 weeks.

## 2023-06-04 ENCOUNTER — Encounter: Payer: Self-pay | Admitting: Hematology and Oncology

## 2023-06-06 ENCOUNTER — Telehealth: Payer: Self-pay | Admitting: Internal Medicine

## 2023-06-06 ENCOUNTER — Other Ambulatory Visit: Payer: Self-pay

## 2023-06-06 ENCOUNTER — Inpatient Hospital Stay: Payer: 59

## 2023-06-06 VITALS — BP 132/79 | HR 98 | Temp 98.0°F | Resp 16

## 2023-06-06 DIAGNOSIS — Z95828 Presence of other vascular implants and grafts: Secondary | ICD-10-CM

## 2023-06-06 DIAGNOSIS — C9000 Multiple myeloma not having achieved remission: Secondary | ICD-10-CM | POA: Diagnosis not present

## 2023-06-06 MED ORDER — HEPARIN SOD (PORK) LOCK FLUSH 100 UNIT/ML IV SOLN
500.0000 [IU] | Freq: Once | INTRAVENOUS | Status: AC | PRN
  Administered 2023-06-06: 500 [IU]

## 2023-06-06 MED ORDER — ZOLEDRONIC ACID 4 MG/100ML IV SOLN
4.0000 mg | Freq: Once | INTRAVENOUS | Status: AC
  Administered 2023-06-06: 4 mg via INTRAVENOUS
  Filled 2023-06-06: qty 100

## 2023-06-06 MED ORDER — SODIUM CHLORIDE 0.9 % IV SOLN: INTRAVENOUS | Status: DC

## 2023-06-06 MED ORDER — AMLODIPINE BESYLATE 5 MG PO TABS: 5.0000 mg | ORAL_TABLET | Freq: Every day | ORAL | 2 refills | Status: DC

## 2023-06-06 MED ORDER — SODIUM CHLORIDE 0.9% FLUSH
10.0000 mL | Freq: Once | INTRAVENOUS | Status: AC | PRN
  Administered 2023-06-06: 10 mL

## 2023-06-06 NOTE — Patient Instructions (Signed)

## 2023-06-06 NOTE — Telephone Encounter (Signed)
RX sent to requested Pharmacy

## 2023-06-06 NOTE — Telephone Encounter (Signed)
*  STAT* If patient is at the pharmacy, call can be transferred to refill team.   1. Which medications need to be refilled? (please list name of each medication and dose if known)   amLODipine (NORVASC) 5 MG tablet    2. Which pharmacy/location (including street and city if local pharmacy) is medication to be sent to? [id number] - [place], [place] - [address] AT [facility] OF [address]    3. Do they need a 30 day or 90 day supply? 30 day

## 2023-07-17 ENCOUNTER — Encounter: Payer: Self-pay | Admitting: Hematology and Oncology

## 2023-07-24 ENCOUNTER — Inpatient Hospital Stay: Payer: 59 | Attending: Hematology and Oncology | Admitting: Hematology and Oncology

## 2023-07-24 ENCOUNTER — Encounter: Payer: Self-pay | Admitting: Hematology and Oncology

## 2023-07-24 ENCOUNTER — Other Ambulatory Visit: Payer: Self-pay | Admitting: *Deleted

## 2023-07-24 VITALS — BP 142/93 | HR 111 | Temp 98.2°F | Resp 18 | Wt 243.3 lb

## 2023-07-24 DIAGNOSIS — Z5112 Encounter for antineoplastic immunotherapy: Secondary | ICD-10-CM | POA: Diagnosis present

## 2023-07-24 DIAGNOSIS — Z9484 Stem cells transplant status: Secondary | ICD-10-CM | POA: Insufficient documentation

## 2023-07-24 DIAGNOSIS — C9000 Multiple myeloma not having achieved remission: Secondary | ICD-10-CM | POA: Diagnosis not present

## 2023-07-24 DIAGNOSIS — Z79899 Other long term (current) drug therapy: Secondary | ICD-10-CM | POA: Insufficient documentation

## 2023-07-24 DIAGNOSIS — G629 Polyneuropathy, unspecified: Secondary | ICD-10-CM | POA: Insufficient documentation

## 2023-07-24 DIAGNOSIS — Z801 Family history of malignant neoplasm of trachea, bronchus and lung: Secondary | ICD-10-CM | POA: Diagnosis not present

## 2023-07-24 DIAGNOSIS — C9001 Multiple myeloma in remission: Secondary | ICD-10-CM

## 2023-07-24 DIAGNOSIS — Z800 Family history of malignant neoplasm of digestive organs: Secondary | ICD-10-CM | POA: Insufficient documentation

## 2023-07-24 DIAGNOSIS — Z809 Family history of malignant neoplasm, unspecified: Secondary | ICD-10-CM | POA: Insufficient documentation

## 2023-07-24 DIAGNOSIS — D472 Monoclonal gammopathy: Secondary | ICD-10-CM | POA: Diagnosis not present

## 2023-07-24 MED ORDER — GABAPENTIN 300 MG PO CAPS
300.0000 mg | ORAL_CAPSULE | Freq: Every day | ORAL | 3 refills | Status: DC
Start: 2023-07-24 — End: 2023-10-28

## 2023-07-24 NOTE — Progress Notes (Signed)
 [facility] Cancer Follow up:    Kara Clotilda SAUNDERS, MD [address] [place] [address]   DIAGNOSIS: Multiple Myeloma  SUMMARY OF ONCOLOGIC HISTORY: Oncology History  Multiple myeloma (HCC)  09/12/2022 Initial Diagnosis   Multiple myeloma (HCC)   09/20/2022 - 10/04/2022 Chemotherapy   Patient is on Treatment Plan : MYELOMA NEWLY DIAGNOSED TRANSPLANT CANDIDATE DaraVRd (Daratumumab  IV) q21d x 6 Cycles (Induction/Consolidation)     10/11/2022 - 10/22/2022 Chemotherapy   Patient is on Treatment Plan : MYELOMA NON-TRANSPLANT CANDIDATES VRd SQ q21d     11/04/2022 - 03/04/2023 Chemotherapy   Patient is on Treatment Plan : MYELOMA NEWLY DIAGNOSED TRANSPLANT CANDIDATE VRd q21d starting cycle 6 (Induction/Consolidation)     07/31/2023 - 07/31/2023 Chemotherapy   Patient is on Treatment Plan : MYELOMA Daratumumab  IV q28d     07/31/2023 -  Chemotherapy   Patient is on Treatment Plan : MYELOMA NEWLY DIAGNOSED TRANSPLANT CANDIDATE Daratumumab  SQ + Lenalidomide  q28d (Maintenance)      ASBMT risk stratification: Low Risk, PR1 ECOG/KPS: 0/90% HSCT-CI score: 2 (Obesity, Age >40) Preparative regimen/treatment protocol: Melphalan 200 mg/m2   03/26/2023 - Bone Marrow Transplant  AUTOLOGOUS STEM CELL TRANSPLANT: Disease status at time of transplant: PR1 ASBMT risk stratification: low risk, ECOG/KPS: 1 / 90%, HSCT-CI score: 2 Mobilization was done with G-CSF and Mozobil with a yield of 2.104 x 10(7) CD34+ cells.  Preparative regimen with Melphalan 200 mg/m2 on 03/25/2023 (outpatient). Infusion of stem cells - 7.01 x 10(6) on 03/26/2023 (outpatient) Completed stem cell process 04/08/2023. Course minimally complicated by mucositis.   ENGRAFTMENT: WBC engraftment on 04/06/2023 and platelet engraftment on 04/11/2023 at [facility]. Last platelet transfusion given 04/04/2023.  04/04/2023 - Supportive Treatment  Adult OP Blood Administration PRN x 30 DAYS Plan Provider: Dorn Dallas Ehrlich, MD   CURRENT THERAPY: DaraVRD  INTERVAL HISTORY:  Kara Medina 48 y.o. female returns for f/u and evaluation.  She had autologous transplant on 03/26/2023.    The patient, with a history of multiple myeloma, recently had a visit with Dr Ehrlich from BMT clinic at [facility].   The patient, with a history of multiple myeloma, recently underwent a bone marrow transplant and is now transitioning to maintenance therapy. She had BMB recently, MRD positive, recommendation was to proceed with dara/revelimid maintenance.  The patient's main concern is the neuropathy she has been experiencing. This has been worsening since the transplant and is now affecting both her day and night. The neuropathy is so severe that it is causing discomfort when wearing shoes and is hindering her ability to walk for exercise. The patient is not currently taking any medication for the neuropathy.  She remains in positive spirit, she had a good time during the holidays.  Rest of the pertinent 10 point ROS reviewed and negative  Patient Active Problem List   Diagnosis Date Noted   Family history of mixed hyperlipidemia 01/30/2023   Obstructive sleep apnea 01/30/2023   Port-A-Cath in place 01/06/2023   HTN (hypertension) 11/14/2022   Multiple myeloma (HCC) 09/12/2022   MGUS (monoclonal gammopathy of unknown significance) 09/10/2022   Hypertensive urgency 08/29/2022   Iron deficiency anemia 08/29/2022   LVH (left ventricular hypertrophy) 08/29/2022   Normocytic anemia 08/29/2022    is allergic to tape and amoxicillin.  MEDICAL HISTORY: Past Medical History:  Diagnosis Date   Anemia    Gestational diabetes    diet controlled   Postpartum care following vaginal delivery (7/27) 02/09/2016  Pregnancy induced hypertension    Trigeminal neuralgia    2012 corrected with surgery    SURGICAL HISTORY: Past Surgical History:  Procedure Laterality Date   APPENDECTOMY  2008   IR IMAGING GUIDED  PORT INSERTION  12/03/2022   TRIGEMINAL NERVE DECOMPRESSION  2012    SOCIAL HISTORY: Social History   Socioeconomic History   Marital status: Married    Spouse name: Not on file   Number of children: Not on file   Years of education: Not on file   Highest education level: Bachelor's degree (e.g., BA, AB, BS)  Occupational History   Not on file  Tobacco Use   Smoking status: Never   Smokeless tobacco: Never  Vaping Use   Vaping status: Never Used  Substance and Sexual Activity   Alcohol use: No   Drug use: No   Sexual activity: Yes  Other Topics Concern   Not on file  Social History Narrative   Not on file   Social Drivers of Health   Financial Resource Strain: Low Risk  (08/23/2022)   Overall Financial Resource Strain (CARDIA)    Difficulty of Paying Living Expenses: Not hard at all  Food Insecurity: No Food Insecurity (08/23/2022)   Hunger Vital Sign    Worried About Running Out of Food in the Last Year: Never true    Ran Out of Food in the Last Year: Never true  Transportation Needs: No Transportation Needs (08/23/2022)   PRAPARE - Administrator, Civil Service (Medical): No    Lack of Transportation (Non-Medical): No  Physical Activity: Insufficiently Active (08/23/2022)   Exercise Vital Sign    Days of Exercise per Week: 2 days    Minutes of Exercise per Session: 30 min  Stress: Stress Concern Present (08/23/2022)   [employer] of Occupational Health - Occupational Stress Questionnaire    Feeling of Stress : To some extent  Social Connections: Unknown (08/23/2022)   Social Connection and Isolation Panel [NHANES]    Frequency of Communication with Friends and Family: More than three times a week    Frequency of Social Gatherings with Friends and Family: Once a week    Attends Religious Services: Patient declined    Database Administrator or Organizations: Yes    Attends Engineer, Structural: More than 4 times per year    Marital Status: Married   Catering Manager Violence: Not on file    FAMILY HISTORY: Family History  Problem Relation Age of Onset   Cancer Other    Hypertension Other    Healthy Father    Cancer Paternal Grandfather        stomach, lung   Hypertension Mother    Breast cancer Mother 63   Hypertension Maternal Grandmother    Arthritis Maternal Grandmother    Hypertension Maternal Grandfather    Other Brother        ?prediabetic   Diabetes Paternal Grandmother    Hypertension Paternal Grandmother    Autism Son     Review of Systems  Constitutional:  Positive for fatigue. Negative for appetite change, chills, fever and unexpected weight change.  HENT:   Negative for hearing loss, lump/mass and trouble swallowing.   Eyes:  Negative for eye problems and icterus.  Respiratory:  Negative for chest tightness, cough and shortness of breath.   Cardiovascular:  Negative for chest pain, leg swelling and palpitations.  Gastrointestinal:  Negative for abdominal distention, abdominal pain, constipation, diarrhea, nausea and vomiting.  Endocrine: Negative for hot flashes.  Genitourinary:  Negative for difficulty urinating.   Musculoskeletal:  Negative for arthralgias.  Skin:  Negative for itching and rash.  Neurological:  Negative for dizziness, extremity weakness, headaches and numbness.  Hematological:  Negative for adenopathy. Does not bruise/bleed easily.  Psychiatric/Behavioral:  Negative for depression. The patient is not nervous/anxious.       PHYSICAL EXAMINATION    Vitals:   07/24/23 0921  BP: (!) 142/93  Pulse: (!) 111  Resp: 18  Temp: 98.2 F (36.8 C)  SpO2: 100%    Physical Exam Constitutional:      General: She is not in acute distress.    Appearance: Normal appearance. She is not toxic-appearing.  HENT:     Head: Normocephalic and atraumatic.     Mouth/Throat:     Mouth: Mucous membranes are moist.     Pharynx: Oropharynx is clear. No oropharyngeal exudate or posterior  oropharyngeal erythema.  Eyes:     General: No scleral icterus. Cardiovascular:     Rate and Rhythm: Normal rate and regular rhythm.     Pulses: Normal pulses.     Heart sounds: Normal heart sounds.  Pulmonary:     Effort: Pulmonary effort is normal.     Breath sounds: Normal breath sounds.  Abdominal:     General: Abdomen is flat. Bowel sounds are normal. There is no distension.     Palpations: Abdomen is soft.     Tenderness: There is no abdominal tenderness.  Musculoskeletal:        General: No swelling.     Cervical back: Neck supple.  Lymphadenopathy:     Cervical: No cervical adenopathy.  Skin:    General: Skin is warm and dry.     Findings: No rash.  Neurological:     General: No focal deficit present.     Mental Status: She is alert.  Psychiatric:        Mood and Affect: Mood normal.        Behavior: Behavior normal.     LABORATORY DATA:  CBC    Component Value Date/Time   WBC 3.4 (L) 05/30/2023 0852   RBC 3.51 (L) 05/30/2023 0852   HGB 10.8 (L) 05/30/2023 0852   HGB 8.8 (L) 04/11/2023 1013   HCT 33.3 (L) 05/30/2023 0852   HCT 24.4 (L) 09/04/2022 1037   PLT 194 05/30/2023 0852   PLT 177 04/11/2023 1013   MCV 94.9 05/30/2023 0852   MCH 30.8 05/30/2023 0852   MCHC 32.4 05/30/2023 0852   RDW 12.8 05/30/2023 0852   LYMPHSABS 1.1 05/30/2023 0852   MONOABS 0.6 05/30/2023 0852   EOSABS 0.2 05/30/2023 0852   BASOSABS 0.0 05/30/2023 0852    CMP     Component Value Date/Time   NA 140 05/30/2023 0852   K 4.3 05/30/2023 0852   CL 108 05/30/2023 0852   CO2 28 05/30/2023 0852   GLUCOSE 77 05/30/2023 0852   BUN 15 05/30/2023 0852   CREATININE 0.84 05/30/2023 0852   CREATININE 0.84 04/11/2023 1013   CREATININE 0.95 08/23/2022 1631   CALCIUM 9.5 05/30/2023 0852   PROT 6.9 05/30/2023 0852   ALBUMIN 4.0 05/30/2023 0852   AST 17 05/30/2023 0852   AST 19 04/11/2023 1013   ALT 11 05/30/2023 0852   ALT 14 04/11/2023 1013   ALKPHOS 41 05/30/2023 0852    BILITOT 0.4 05/30/2023 0852   BILITOT 0.3 04/11/2023 1013   GFRNONAA >60 05/30/2023 9147  GFRNONAA >60 04/11/2023 1013   GFRAA >60 02/09/2016 0525   ASSESSMENT and THERAPY PLAN:   Multiple myeloma (HCC) Multiple Myeloma Ig G Kappa MM, status post daratumumab  Revlimid  and dexamethasone  induction followed by autologous stem cell transplant in September 2024. Post-transplant, PR1 with M spike reduction to 0.23 grams. MRD positive at 0.1593%. Plan for maintenance with daratumumab  and Revlimid . -Start daratumumab  and Revlimid  maintenance therapy. Daratumumab  subcu every 28 days, Revlimid  10 mg daily from day 1 to day 21 followed by 1 week off -Continue Acyclovir  for a total of 1 year post-transplant. -Continue Bactrim DS for a total of 6 months post-transplant. -Order monthly multiple myeloma panel labs.  Neuropathy Post-transplant neuropathy causing discomfort and difficulty walking. -Start gabapentin  at night for neuropathy control. Monitor for drowsiness and adjust dose as needed.  Dental Prophylaxis Upcoming dental procedure in February. -Take prescribed Z-Pak prior to dental procedure.  General Health Maintenance -Continue multivitamin and folic acid  indefinitely. -Revaccination plan to start in March due to post-transplant immunosuppression. -Continue Zometa  infusions, next due February 9th. -Plan for follow-up in approximately 1 month.   All questions were answered. The patient knows to call the clinic with any problems, questions or concerns. We can certainly see the patient much sooner if necessary.  Total encounter time:40 minutes*in face-to-face visit time, chart review, lab review, care coordination, order entry, and documentation of the encounter time.   *Total Encounter Time as defined by the Centers for Medicare and Medicaid Services includes, in addition to the face-to-face time of a patient visit (documented in the note above) non-face-to-face time: obtaining and  reviewing outside history, ordering and reviewing medications, tests or procedures, care coordination (communications with other health care professionals or caregivers) and documentation in the medical record.

## 2023-07-24 NOTE — Assessment & Plan Note (Signed)
 Multiple Myeloma Ig G Kappa MM, status post daratumumab  Revlimid  and dexamethasone  induction followed by autologous stem cell transplant in September 2024. Post-transplant, PR1 with M spike reduction to 0.23 grams. MRD positive at 0.1593%. Plan for maintenance with daratumumab  and Revlimid . -Start daratumumab  and Revlimid  maintenance therapy. Daratumumab  subcu every 28 days, Revlimid  10 mg daily from day 1 to day 21 followed by 1 week off -Continue Acyclovir  for a total of 1 year post-transplant. -Continue Bactrim DS for a total of 6 months post-transplant. -Order monthly multiple myeloma panel labs.  Neuropathy Post-transplant neuropathy causing discomfort and difficulty walking. -Start gabapentin  at night for neuropathy control. Monitor for drowsiness and adjust dose as needed.  Dental Prophylaxis Upcoming dental procedure in February. -Take prescribed Z-Pak prior to dental procedure.  General Health Maintenance -Continue multivitamin and folic acid  indefinitely. -Revaccination plan to start in March due to post-transplant immunosuppression. -Continue Zometa  infusions, next due February 9th. -Plan for follow-up in approximately 1 month.

## 2023-07-25 ENCOUNTER — Other Ambulatory Visit: Payer: 59

## 2023-07-28 ENCOUNTER — Other Ambulatory Visit: Payer: Self-pay | Admitting: Hematology and Oncology

## 2023-07-29 ENCOUNTER — Other Ambulatory Visit: Payer: Self-pay | Admitting: Hematology and Oncology

## 2023-07-29 NOTE — Progress Notes (Unsigned)
 [facility] Cancer Follow up:    Kara Clotilda SAUNDERS, MD [address] [place] [address]   DIAGNOSIS: Multiple Myeloma  SUMMARY OF ONCOLOGIC HISTORY: Oncology History  Multiple myeloma (HCC)  09/12/2022 Initial Diagnosis   Multiple myeloma (HCC)   09/20/2022 - 10/04/2022 Chemotherapy   Patient is on Treatment Plan : MYELOMA NEWLY DIAGNOSED TRANSPLANT CANDIDATE DaraVRd (Daratumumab  IV) q21d x 6 Cycles (Induction/Consolidation)     10/11/2022 - 10/22/2022 Chemotherapy   Patient is on Treatment Plan : MYELOMA NON-TRANSPLANT CANDIDATES VRd SQ q21d     11/04/2022 - 03/04/2023 Chemotherapy   Patient is on Treatment Plan : MYELOMA NEWLY DIAGNOSED TRANSPLANT CANDIDATE VRd q21d starting cycle 6 (Induction/Consolidation)     07/31/2023 - 07/31/2023 Chemotherapy   Patient is on Treatment Plan : MYELOMA Daratumumab  IV q28d     07/31/2023 -  Chemotherapy   Patient is on Treatment Plan : MYELOMA NEWLY DIAGNOSED TRANSPLANT CANDIDATE Daratumumab  SQ + Lenalidomide  q28d (Maintenance)      ASBMT risk stratification: Low Risk, PR1 ECOG/KPS: 0/90% HSCT-CI score: 2 (Obesity, Age >40) Preparative regimen/treatment protocol: Melphalan 200 mg/m2   03/26/2023 - Bone Marrow Transplant  AUTOLOGOUS STEM CELL TRANSPLANT: Disease status at time of transplant: PR1 ASBMT risk stratification: low risk, ECOG/KPS: 1 / 90%, HSCT-CI score: 2 Mobilization was done with G-CSF and Mozobil with a yield of 2.104 x 10(7) CD34+ cells.  Preparative regimen with Melphalan 200 mg/m2 on 03/25/2023 (outpatient). Infusion of stem cells - 7.01 x 10(6) on 03/26/2023 (outpatient) Completed stem cell process 04/08/2023. Course minimally complicated by mucositis.   ENGRAFTMENT: WBC engraftment on 04/06/2023 and platelet engraftment on 04/11/2023 at [facility]. Last platelet transfusion given 04/04/2023.  04/04/2023 - Supportive Treatment  Adult OP Blood Administration PRN x 30 DAYS Plan Provider: Dorn Dallas Ehrlich, MD   CURRENT THERAPY: DaraVRD  INTERVAL HISTORY:  Kara Medina [place] 48 y.o. female returns for f/u and evaluation.  She had autologous transplant on 03/26/2023.    The patient, with a history of multiple myeloma, recently had a visit with Dr Ehrlich from BMT clinic at [facility].   The patient, with a history of multiple myeloma, recently underwent a bone marrow transplant and is now transitioning to maintenance therapy. She had BMB recently, MRD positive, recommendation was to proceed with dara/revelimid maintenance.  The patient's main concern is the neuropathy she has been experiencing. This has been worsening since the transplant and is now affecting both her day and night. The neuropathy is so severe that it is causing discomfort when wearing shoes and is hindering her ability to walk for exercise. The patient is not currently taking any medication for the neuropathy.  She remains in positive spirit, she had a good time during the holidays.  Rest of the pertinent 10 point ROS reviewed and negative  Patient Active Problem List   Diagnosis Date Noted   Family history of mixed hyperlipidemia 01/30/2023   Obstructive sleep apnea 01/30/2023   Port-A-Cath in place 01/06/2023   HTN (hypertension) 11/14/2022   Multiple myeloma (HCC) 09/12/2022   MGUS (monoclonal gammopathy of unknown significance) 09/10/2022   Hypertensive urgency 08/29/2022   Iron deficiency anemia 08/29/2022   LVH (left ventricular hypertrophy) 08/29/2022   Normocytic anemia 08/29/2022    is allergic to tape and amoxicillin.  MEDICAL HISTORY: Past Medical History:  Diagnosis Date   Anemia    Gestational diabetes    diet controlled   Postpartum care following vaginal delivery (7/27) 02/09/2016  Pregnancy induced hypertension    Trigeminal neuralgia    2012 corrected with surgery    SURGICAL HISTORY: Past Surgical History:  Procedure Laterality Date   APPENDECTOMY  2008   IR IMAGING GUIDED  PORT INSERTION  12/03/2022   TRIGEMINAL NERVE DECOMPRESSION  2012    SOCIAL HISTORY: Social History   Socioeconomic History   Marital status: Married    Spouse name: Not on file   Number of children: Not on file   Years of education: Not on file   Highest education level: Bachelor's degree (e.g., BA, AB, BS)  Occupational History   Not on file  Tobacco Use   Smoking status: Never   Smokeless tobacco: Never  Vaping Use   Vaping status: Never Used  Substance and Sexual Activity   Alcohol use: No   Drug use: No   Sexual activity: Yes  Other Topics Concern   Not on file  Social History Narrative   Not on file   Social Drivers of Health   Financial Resource Strain: Low Risk  (08/23/2022)   Overall Financial Resource Strain (CARDIA)    Difficulty of Paying Living Expenses: Not hard at all  Food Insecurity: No Food Insecurity (08/23/2022)   Hunger Vital Sign    Worried About Running Out of Food in the Last Year: Never true    Ran Out of Food in the Last Year: Never true  Transportation Needs: No Transportation Needs (08/23/2022)   PRAPARE - Administrator, Civil Service (Medical): No    Lack of Transportation (Non-Medical): No  Physical Activity: Insufficiently Active (08/23/2022)   Exercise Vital Sign    Days of Exercise per Week: 2 days    Minutes of Exercise per Session: 30 min  Stress: Stress Concern Present (08/23/2022)   [employer] of Occupational [employer] - Occupational Stress Questionnaire    Feeling of Stress : To some extent  Social Connections: Unknown (08/23/2022)   Social Connection and Isolation Panel [NHANES]    Frequency of Communication with Friends and Family: More than three times a week    Frequency of Social Gatherings with Friends and Family: Once a week    Attends Religious Services: Patient declined    Database Administrator or Organizations: Yes    Attends Engineer, Structural: More than 4 times per year    Marital Status: Married   Catering Manager Violence: Not on file    FAMILY HISTORY: Family History  Problem Relation Age of Onset   Cancer Other    Hypertension Other    Healthy Father    Cancer Paternal Grandfather        stomach, lung   Hypertension Mother    Breast cancer Mother 49   Hypertension Maternal Grandmother    Arthritis Maternal Grandmother    Hypertension Maternal Grandfather    Other Brother        ?prediabetic   Diabetes Paternal Grandmother    Hypertension Paternal Grandmother    Autism Son     Review of Systems  Constitutional:  Positive for fatigue. Negative for appetite change, chills, fever and unexpected weight change.  HENT:   Negative for hearing loss, lump/mass and trouble swallowing.   Eyes:  Negative for eye problems and icterus.  Respiratory:  Negative for chest tightness, cough and shortness of breath.   Cardiovascular:  Negative for chest pain, leg swelling and palpitations.  Gastrointestinal:  Negative for abdominal distention, abdominal pain, constipation, diarrhea, nausea and vomiting.  Endocrine: Negative for hot flashes.  Genitourinary:  Negative for difficulty urinating.   Musculoskeletal:  Negative for arthralgias.  Skin:  Negative for itching and rash.  Neurological:  Negative for dizziness, extremity weakness, headaches and numbness.  Hematological:  Negative for adenopathy. Does not bruise/bleed easily.  Psychiatric/Behavioral:  Negative for depression. The patient is not nervous/anxious.       PHYSICAL EXAMINATION    There were no vitals filed for this visit.   Physical Exam Constitutional:      General: She is not in acute distress.    Appearance: Normal appearance. She is not toxic-appearing.  HENT:     Head: Normocephalic and atraumatic.     Mouth/Throat:     Mouth: Mucous membranes are moist.     Pharynx: Oropharynx is clear. No oropharyngeal exudate or posterior oropharyngeal erythema.  Eyes:     General: No scleral  icterus. Cardiovascular:     Rate and Rhythm: Normal rate and regular rhythm.     Pulses: Normal pulses.     Heart sounds: Normal heart sounds.  Pulmonary:     Effort: Pulmonary effort is normal.     Breath sounds: Normal breath sounds.  Abdominal:     General: Abdomen is flat. Bowel sounds are normal. There is no distension.     Palpations: Abdomen is soft.     Tenderness: There is no abdominal tenderness.  Musculoskeletal:        General: No swelling.     Cervical back: Neck supple.  Lymphadenopathy:     Cervical: No cervical adenopathy.  Skin:    General: Skin is warm and dry.     Findings: No rash.  Neurological:     General: No focal deficit present.     Mental Status: She is alert.  Psychiatric:        Mood and Affect: Mood normal.        Behavior: Behavior normal.     LABORATORY DATA:  CBC    Component Value Date/Time   WBC 3.4 (L) 05/30/2023 0852   RBC 3.51 (L) 05/30/2023 0852   HGB 10.8 (L) 05/30/2023 0852   HGB 8.8 (L) 04/11/2023 1013   HCT 33.3 (L) 05/30/2023 0852   HCT 24.4 (L) 09/04/2022 1037   PLT 194 05/30/2023 0852   PLT 177 04/11/2023 1013   MCV 94.9 05/30/2023 0852   MCH 30.8 05/30/2023 0852   MCHC 32.4 05/30/2023 0852   RDW 12.8 05/30/2023 0852   LYMPHSABS 1.1 05/30/2023 0852   MONOABS 0.6 05/30/2023 0852   EOSABS 0.2 05/30/2023 0852   BASOSABS 0.0 05/30/2023 0852    CMP     Component Value Date/Time   NA 140 05/30/2023 0852   K 4.3 05/30/2023 0852   CL 108 05/30/2023 0852   CO2 28 05/30/2023 0852   GLUCOSE 77 05/30/2023 0852   BUN 15 05/30/2023 0852   CREATININE 0.84 05/30/2023 0852   CREATININE 0.84 04/11/2023 1013   CREATININE 0.95 08/23/2022 1631   CALCIUM 9.5 05/30/2023 0852   PROT 6.9 05/30/2023 0852   ALBUMIN 4.0 05/30/2023 0852   AST 17 05/30/2023 0852   AST 19 04/11/2023 1013   ALT 11 05/30/2023 0852   ALT 14 04/11/2023 1013   ALKPHOS 41 05/30/2023 0852   BILITOT 0.4 05/30/2023 0852   BILITOT 0.3 04/11/2023 1013    GFRNONAA >60 05/30/2023 0852   GFRNONAA >60 04/11/2023 1013   GFRAA >60 02/09/2016 0525   ASSESSMENT and THERAPY PLAN:  No problem-specific Assessment & Plan notes found for this encounter.    All questions were answered. The patient knows to call the clinic with any problems, questions or concerns. We can certainly see the patient much sooner if necessary.  Total encounter time:40 minutes*in face-to-face visit time, chart review, lab review, care coordination, order entry, and documentation of the encounter time.   *Total Encounter Time as defined by the Centers for Medicare and Medicaid Services includes, in addition to the face-to-face time of a patient visit (documented in the note above) non-face-to-face time: obtaining and reviewing outside history, ordering and reviewing medications, tests or procedures, care coordination (communications with other health care professionals or caregivers) and documentation in the medical record.

## 2023-07-30 ENCOUNTER — Other Ambulatory Visit: Payer: Self-pay | Admitting: *Deleted

## 2023-07-30 ENCOUNTER — Inpatient Hospital Stay: Payer: 59

## 2023-07-30 ENCOUNTER — Other Ambulatory Visit: Payer: Self-pay

## 2023-07-30 DIAGNOSIS — D472 Monoclonal gammopathy: Secondary | ICD-10-CM

## 2023-07-31 ENCOUNTER — Other Ambulatory Visit: Payer: Self-pay | Admitting: *Deleted

## 2023-07-31 ENCOUNTER — Inpatient Hospital Stay: Payer: 59

## 2023-07-31 ENCOUNTER — Other Ambulatory Visit (HOSPITAL_COMMUNITY): Payer: Self-pay

## 2023-07-31 ENCOUNTER — Other Ambulatory Visit: Payer: Self-pay

## 2023-07-31 ENCOUNTER — Other Ambulatory Visit: Payer: Self-pay | Admitting: Hematology and Oncology

## 2023-07-31 DIAGNOSIS — C9000 Multiple myeloma not having achieved remission: Secondary | ICD-10-CM

## 2023-07-31 DIAGNOSIS — D472 Monoclonal gammopathy: Secondary | ICD-10-CM

## 2023-07-31 DIAGNOSIS — Z5112 Encounter for antineoplastic immunotherapy: Secondary | ICD-10-CM | POA: Diagnosis not present

## 2023-07-31 DIAGNOSIS — C9001 Multiple myeloma in remission: Secondary | ICD-10-CM

## 2023-07-31 LAB — PREGNANCY, URINE: Preg Test, Ur: NEGATIVE

## 2023-07-31 MED ORDER — ONDANSETRON HCL 8 MG PO TABS: 8.0000 mg | ORAL_TABLET | Freq: Three times a day (TID) | ORAL | 1 refills | Status: DC | PRN

## 2023-07-31 MED ORDER — LIDOCAINE-PRILOCAINE 2.5-2.5 % EX CREA: TOPICAL_CREAM | CUTANEOUS | 3 refills | Status: AC

## 2023-07-31 MED ORDER — ACYCLOVIR 400 MG PO TABS: 400.0000 mg | ORAL_TABLET | Freq: Two times a day (BID) | ORAL | 5 refills | Status: DC

## 2023-07-31 MED ORDER — LENALIDOMIDE 10 MG PO CAPS
10.0000 mg | ORAL_CAPSULE | Freq: Every day | ORAL | 0 refills | Status: DC
  Filled 2023-07-31: qty 21, 21d supply, fill #0

## 2023-07-31 MED ORDER — PROCHLORPERAZINE MALEATE 10 MG PO TABS: 10.0000 mg | ORAL_TABLET | Freq: Four times a day (QID) | ORAL | 1 refills | Status: AC | PRN

## 2023-08-01 ENCOUNTER — Other Ambulatory Visit (HOSPITAL_COMMUNITY): Payer: Self-pay

## 2023-08-01 ENCOUNTER — Other Ambulatory Visit: Payer: Self-pay

## 2023-08-01 ENCOUNTER — Other Ambulatory Visit: Payer: Self-pay | Admitting: *Deleted

## 2023-08-04 ENCOUNTER — Other Ambulatory Visit: Payer: Self-pay | Admitting: *Deleted

## 2023-08-04 DIAGNOSIS — C9000 Multiple myeloma not having achieved remission: Secondary | ICD-10-CM

## 2023-08-04 DIAGNOSIS — D472 Monoclonal gammopathy: Secondary | ICD-10-CM

## 2023-08-04 MED ORDER — LENALIDOMIDE 10 MG PO CAPS: 10.0000 mg | ORAL_CAPSULE | Freq: Every day | ORAL | 0 refills | Status: DC

## 2023-08-05 ENCOUNTER — Inpatient Hospital Stay: Payer: 59

## 2023-08-05 VITALS — BP 125/82 | HR 90 | Temp 98.9°F | Resp 18 | Wt 247.8 lb

## 2023-08-05 DIAGNOSIS — Z5112 Encounter for antineoplastic immunotherapy: Secondary | ICD-10-CM | POA: Diagnosis not present

## 2023-08-05 DIAGNOSIS — C9001 Multiple myeloma in remission: Secondary | ICD-10-CM

## 2023-08-05 DIAGNOSIS — C9000 Multiple myeloma not having achieved remission: Secondary | ICD-10-CM

## 2023-08-05 DIAGNOSIS — D472 Monoclonal gammopathy: Secondary | ICD-10-CM

## 2023-08-05 DIAGNOSIS — Z95828 Presence of other vascular implants and grafts: Secondary | ICD-10-CM

## 2023-08-05 LAB — CBC WITH DIFFERENTIAL (CANCER CENTER ONLY)
Abs Immature Granulocytes: 0.01 10*3/uL (ref 0.00–0.07)
Basophils Absolute: 0 10*3/uL (ref 0.0–0.1)
Basophils Relative: 1 %
Eosinophils Absolute: 0.1 10*3/uL (ref 0.0–0.5)
Eosinophils Relative: 3 %
HCT: 32.6 % — ABNORMAL LOW (ref 36.0–46.0)
Hemoglobin: 10.6 g/dL — ABNORMAL LOW (ref 12.0–15.0)
Immature Granulocytes: 0 %
Lymphocytes Relative: 25 %
Lymphs Abs: 1 10*3/uL (ref 0.7–4.0)
MCH: 30 pg (ref 26.0–34.0)
MCHC: 32.5 g/dL (ref 30.0–36.0)
MCV: 92.4 fL (ref 80.0–100.0)
Monocytes Absolute: 0.6 10*3/uL (ref 0.1–1.0)
Monocytes Relative: 15 %
Neutro Abs: 2.4 10*3/uL (ref 1.7–7.7)
Neutrophils Relative %: 56 %
Platelet Count: 215 10*3/uL (ref 150–400)
RBC: 3.53 MIL/uL — ABNORMAL LOW (ref 3.87–5.11)
RDW: 11.9 % (ref 11.5–15.5)
WBC Count: 4.2 10*3/uL (ref 4.0–10.5)
nRBC: 0 % (ref 0.0–0.2)

## 2023-08-05 LAB — CMP (CANCER CENTER ONLY)
ALT: 15 U/L (ref 0–44)
AST: 19 U/L (ref 15–41)
Albumin: 4.1 g/dL (ref 3.5–5.0)
Alkaline Phosphatase: 45 U/L (ref 38–126)
Anion gap: 5 (ref 5–15)
BUN: 15 mg/dL (ref 6–20)
CO2: 26 mmol/L (ref 22–32)
Calcium: 9.1 mg/dL (ref 8.9–10.3)
Chloride: 107 mmol/L (ref 98–111)
Creatinine: 0.8 mg/dL (ref 0.44–1.00)
GFR, Estimated: >60 mL/min (ref >60)
Glucose, Bld: 96 mg/dL (ref 70–99)
Potassium: 4.2 mmol/L (ref 3.5–5.1)
Sodium: 138 mmol/L (ref 135–145)
Total Bilirubin: 0.5 mg/dL (ref 0.0–1.2)
Total Protein: 6.8 g/dL (ref 6.5–8.1)

## 2023-08-05 MED ORDER — SODIUM CHLORIDE 0.9% FLUSH
10.0000 mL | Freq: Once | INTRAVENOUS | Status: AC
  Administered 2023-08-05: 10 mL

## 2023-08-05 MED ORDER — ACETAMINOPHEN 325 MG PO TABS
650.0000 mg | ORAL_TABLET | Freq: Once | ORAL | Status: AC
  Administered 2023-08-05: 650 mg via ORAL
  Filled 2023-08-05: qty 2

## 2023-08-05 MED ORDER — DIPHENHYDRAMINE HCL 25 MG PO CAPS
50.0000 mg | ORAL_CAPSULE | Freq: Once | ORAL | Status: AC
  Administered 2023-08-05: 50 mg via ORAL
  Filled 2023-08-05: qty 2

## 2023-08-05 MED ORDER — DARATUMUMAB-HYALURONIDASE-FIHJ 1800-30000 MG-UT/15ML ~~LOC~~ SOLN
1800.0000 mg | Freq: Once | SUBCUTANEOUS | Status: AC
Start: 2023-08-05 — End: 2023-08-05
  Administered 2023-08-05: 1800 mg via SUBCUTANEOUS
  Filled 2023-08-05: qty 15

## 2023-08-05 MED ORDER — DEXAMETHASONE 4 MG PO TABS
20.0000 mg | ORAL_TABLET | Freq: Once | ORAL | Status: AC
  Administered 2023-08-05: 20 mg via ORAL
  Filled 2023-08-05: qty 5

## 2023-08-05 MED ORDER — MONTELUKAST SODIUM 10 MG PO TABS
10.0000 mg | ORAL_TABLET | Freq: Once | ORAL | Status: AC
Start: 2023-08-05 — End: 2023-08-05
  Administered 2023-08-05: 10 mg via ORAL
  Filled 2023-08-05: qty 1

## 2023-08-05 NOTE — Progress Notes (Signed)
MD advises, ok to observe for 45 mins post 1st injection since patient has had daratumumab in the past. Pt observed for 45 minutes. No issues.

## 2023-08-05 NOTE — Patient Instructions (Signed)
 [facility] - A DEPT OF [facility]  Discharge Instructions: Thank you for choosing [facility] to provide your oncology and hematology care.   If you have a lab appointment with the Cancer Center, please go directly to the Cancer Center and check in at the registration area.   Wear comfortable clothing and clothing appropriate for easy access to any Portacath or PICC line.   We strive to give you quality time with your provider. You may need to reschedule your appointment if you arrive late (15 or more minutes).  Arriving late affects you and other patients whose appointments are after yours.  Also, if you miss three or more appointments without notifying the office, you may be dismissed from the clinic at the provider's discretion.      For prescription refill requests, have your pharmacy contact our office and allow 72 hours for refills to be completed.    Today you received the following chemotherapy and/or immunotherapy agents: daratumumab-hyaluronidase-fihj      To help prevent nausea and vomiting after your treatment, we encourage you to take your nausea medication as directed.  BELOW ARE SYMPTOMS THAT SHOULD BE REPORTED IMMEDIATELY: *FEVER GREATER THAN 100.4 F (38 C) OR HIGHER *CHILLS OR SWEATING *NAUSEA AND VOMITING THAT IS NOT CONTROLLED WITH YOUR NAUSEA MEDICATION *UNUSUAL SHORTNESS OF BREATH *UNUSUAL BRUISING OR BLEEDING *URINARY PROBLEMS (pain or burning when urinating, or frequent urination) *BOWEL PROBLEMS (unusual diarrhea, constipation, pain near the anus) TENDERNESS IN MOUTH AND THROAT WITH OR WITHOUT PRESENCE OF ULCERS (sore throat, sores in mouth, or a toothache) UNUSUAL RASH, SWELLING OR PAIN  UNUSUAL VAGINAL DISCHARGE OR ITCHING   Items with * indicate a potential emergency and should be followed up as soon as possible or go to the Emergency Department if any problems should occur.  Please show the CHEMOTHERAPY ALERT  CARD or IMMUNOTHERAPY ALERT CARD at check-in to the Emergency Department and triage nurse.  Should you have questions after your visit or need to cancel or reschedule your appointment, please contact [facility] - A DEPT OF [facility]  Dept: [phone]  and follow the prompts.  Office hours are 8:00 a.m. to 4:30 p.m. Monday - Friday. Please note that voicemails left after 4:00 p.m. may not be returned until the following business day.  We are closed weekends and major holidays. You have access to a nurse at all times for urgent questions. Please call the main number to the clinic Dept: [phone] and follow the prompts.   For any non-urgent questions, you may also contact your provider using MyChart. We now offer e-Visits for anyone 13 and older to request care online for non-urgent symptoms. For details visit [email or web address].   Also download the MyChart app! Go to the app store, search "MyChart", open the app, select [place], and log in with your MyChart username and password.

## 2023-08-06 ENCOUNTER — Encounter: Payer: Self-pay | Admitting: Hematology and Oncology

## 2023-08-06 LAB — KAPPA/LAMBDA LIGHT CHAINS
Kappa free light chain: 18.2 mg/L (ref 3.3–19.4)
Kappa, lambda light chain ratio: 2 — ABNORMAL HIGH (ref 0.26–1.65)
Lambda free light chains: 9.1 mg/L (ref 5.7–26.3)

## 2023-08-06 LAB — IGG, IGA, IGM
IgA: 148 mg/dL (ref 87–352)
IgG (Immunoglobin G), Serum: 1063 mg/dL (ref 586–1602)
IgM (Immunoglobulin M), Srm: 44 mg/dL (ref 26–217)

## 2023-08-06 LAB — BETA 2 MICROGLOBULIN, SERUM: Beta-2 Microglobulin: 1.4 mg/L (ref 0.6–2.4)

## 2023-08-08 ENCOUNTER — Telehealth: Payer: Self-pay | Admitting: *Deleted

## 2023-08-08 ENCOUNTER — Other Ambulatory Visit: Payer: Self-pay | Admitting: *Deleted

## 2023-08-08 DIAGNOSIS — C9000 Multiple myeloma not having achieved remission: Secondary | ICD-10-CM

## 2023-08-08 DIAGNOSIS — D472 Monoclonal gammopathy: Secondary | ICD-10-CM

## 2023-08-08 MED ORDER — LENALIDOMIDE 10 MG PO CAPS: 10.0000 mg | ORAL_CAPSULE | Freq: Every day | ORAL | 0 refills | Status: DC

## 2023-08-11 ENCOUNTER — Telehealth: Payer: Self-pay | Admitting: *Deleted

## 2023-08-11 NOTE — Telephone Encounter (Signed)
This RN received call from pt last week stating the lenalidomide prescribed on 1/16 cannot get filled by [facility] - and they will need a new prescription with auth number on 08/08/2023.  On 08/08/2023 this RN obtained new auth number per contact with [employer] with new auth number due to expiration.  Today at 345 pt called to this RN stating [facility] has contacted her stating need for new prescription and auth number due to auth # on 1/24 has now expired.  This RN attempted to log on to [employer] without ability due to site not accepting sign in criteria.  This RN called [facility] speciality discussed above including that pt had an active auth x 2 - and due to delays by [facility]- ( bad weather zone ) auth number expired. Post multiple conversations with staff was informed prescription cannot be filled without a new auth number  This  RN attempted to call [employer]- with notice of high volume of calls - put on hold greater then 10 minutes.  This RN had to d/c line due to other needs in the clinic.  This RN called pt and informed her of delay and need to continue follow up tomorrow.

## 2023-08-11 NOTE — Telephone Encounter (Signed)
No entry

## 2023-08-12 ENCOUNTER — Inpatient Hospital Stay: Payer: 59

## 2023-08-12 ENCOUNTER — Telehealth: Payer: Self-pay | Admitting: *Deleted

## 2023-08-12 ENCOUNTER — Other Ambulatory Visit: Payer: Self-pay | Admitting: *Deleted

## 2023-08-12 DIAGNOSIS — D472 Monoclonal gammopathy: Secondary | ICD-10-CM

## 2023-08-12 DIAGNOSIS — C9000 Multiple myeloma not having achieved remission: Secondary | ICD-10-CM

## 2023-08-12 DIAGNOSIS — Z5112 Encounter for antineoplastic immunotherapy: Secondary | ICD-10-CM | POA: Diagnosis not present

## 2023-08-12 LAB — PREGNANCY, URINE: Preg Test, Ur: NEGATIVE

## 2023-08-12 MED ORDER — LENALIDOMIDE 10 MG PO CAPS: 10.0000 mg | ORAL_CAPSULE | Freq: Every day | ORAL | 0 refills | Status: DC

## 2023-08-12 NOTE — Telephone Encounter (Signed)
This RN obtained new auth number for dispense of lenalidomide per neg urine pregnancy test obtained today.  Prescription sent with request for urgent fill due to delay in therapy secondary to "weather" impairing send out therefore allowing auth number to expire.

## 2023-08-13 ENCOUNTER — Encounter: Payer: Self-pay | Admitting: Hematology and Oncology

## 2023-08-14 LAB — PROTEIN ELECTROPHORESIS, SERUM, WITH REFLEX
A/G Ratio: 1.3 (ref 0.7–1.7)
Albumin ELP: 3.7 g/dL (ref 2.9–4.4)
Alpha-1-Globulin: 0.2 g/dL (ref 0.0–0.4)
Alpha-2-Globulin: 0.8 g/dL (ref 0.4–1.0)
Beta Globulin: 1 g/dL (ref 0.7–1.3)
Gamma Globulin: 0.9 g/dL (ref 0.4–1.8)
Globulin, Total: 2.9 g/dL (ref 2.2–3.9)
M-Spike, %: 0.4 g/dL — ABNORMAL HIGH
SPEP Interpretation: 0
Total Protein ELP: 6.6 g/dL (ref 6.0–8.5)

## 2023-08-14 LAB — IMMUNOFIXATION REFLEX, SERUM
IgA: 168 mg/dL (ref 87–352)
IgG (Immunoglobin G), Serum: 1138 mg/dL (ref 586–1602)
IgM (Immunoglobulin M), Srm: 46 mg/dL (ref 26–217)

## 2023-08-27 ENCOUNTER — Inpatient Hospital Stay: Payer: 59 | Admitting: Hematology and Oncology

## 2023-08-27 ENCOUNTER — Telehealth: Payer: Self-pay | Admitting: Adult Health

## 2023-08-27 ENCOUNTER — Inpatient Hospital Stay: Payer: 59

## 2023-08-27 NOTE — Telephone Encounter (Signed)
request to cancel appointment

## 2023-08-29 ENCOUNTER — Telehealth: Payer: 59 | Admitting: Adult Health

## 2023-09-02 ENCOUNTER — Inpatient Hospital Stay: Payer: 59

## 2023-09-02 ENCOUNTER — Inpatient Hospital Stay: Payer: 59 | Attending: Hematology and Oncology | Admitting: Hematology and Oncology

## 2023-09-02 VITALS — BP 113/74 | HR 85 | Temp 98.4°F | Resp 18

## 2023-09-02 DIAGNOSIS — Z79620 Long term (current) use of immunosuppressive biologic: Secondary | ICD-10-CM | POA: Insufficient documentation

## 2023-09-02 DIAGNOSIS — Z5112 Encounter for antineoplastic immunotherapy: Secondary | ICD-10-CM | POA: Insufficient documentation

## 2023-09-02 DIAGNOSIS — C9000 Multiple myeloma not having achieved remission: Secondary | ICD-10-CM

## 2023-09-02 DIAGNOSIS — Z7983 Long term (current) use of bisphosphonates: Secondary | ICD-10-CM | POA: Insufficient documentation

## 2023-09-02 DIAGNOSIS — Z95828 Presence of other vascular implants and grafts: Secondary | ICD-10-CM

## 2023-09-02 DIAGNOSIS — D472 Monoclonal gammopathy: Secondary | ICD-10-CM

## 2023-09-02 DIAGNOSIS — C9001 Multiple myeloma in remission: Secondary | ICD-10-CM

## 2023-09-02 LAB — CMP (CANCER CENTER ONLY)
ALT: 18 U/L (ref 0–44)
AST: 19 U/L (ref 15–41)
Albumin: 4.2 g/dL (ref 3.5–5.0)
Alkaline Phosphatase: 45 U/L (ref 38–126)
Anion gap: 5 (ref 5–15)
BUN: 15 mg/dL (ref 6–20)
CO2: 27 mmol/L (ref 22–32)
Calcium: 9 mg/dL (ref 8.9–10.3)
Chloride: 108 mmol/L (ref 98–111)
Creatinine: 0.92 mg/dL (ref 0.44–1.00)
GFR, Estimated: >60 mL/min (ref >60)
Glucose, Bld: 96 mg/dL (ref 70–99)
Potassium: 3.9 mmol/L (ref 3.5–5.1)
Sodium: 140 mmol/L (ref 135–145)
Total Bilirubin: 0.5 mg/dL (ref 0.0–1.2)
Total Protein: 6.3 g/dL — ABNORMAL LOW (ref 6.5–8.1)

## 2023-09-02 LAB — CBC WITH DIFFERENTIAL (CANCER CENTER ONLY)
Abs Immature Granulocytes: 0.01 10*3/uL (ref 0.00–0.07)
Basophils Absolute: 0.1 10*3/uL (ref 0.0–0.1)
Basophils Relative: 2 %
Eosinophils Absolute: 0.2 10*3/uL (ref 0.0–0.5)
Eosinophils Relative: 6 %
HCT: 34.7 % — ABNORMAL LOW (ref 36.0–46.0)
Hemoglobin: 11.3 g/dL — ABNORMAL LOW (ref 12.0–15.0)
Immature Granulocytes: 0 %
Lymphocytes Relative: 29 %
Lymphs Abs: 0.9 10*3/uL (ref 0.7–4.0)
MCH: 29.9 pg (ref 26.0–34.0)
MCHC: 32.6 g/dL (ref 30.0–36.0)
MCV: 91.8 fL (ref 80.0–100.0)
Monocytes Absolute: 0.5 10*3/uL (ref 0.1–1.0)
Monocytes Relative: 16 %
Neutro Abs: 1.5 10*3/uL — ABNORMAL LOW (ref 1.7–7.7)
Neutrophils Relative %: 47 %
Platelet Count: 194 10*3/uL (ref 150–400)
RBC: 3.78 MIL/uL — ABNORMAL LOW (ref 3.87–5.11)
RDW: 12.2 % (ref 11.5–15.5)
WBC Count: 3.2 10*3/uL — ABNORMAL LOW (ref 4.0–10.5)
nRBC: 0 % (ref 0.0–0.2)

## 2023-09-02 MED ORDER — DARATUMUMAB-HYALURONIDASE-FIHJ 1800-30000 MG-UT/15ML ~~LOC~~ SOLN
1800.0000 mg | Freq: Once | SUBCUTANEOUS | Status: AC
  Administered 2023-09-02: 1800 mg via SUBCUTANEOUS
  Filled 2023-09-02: qty 15

## 2023-09-02 MED ORDER — DEXAMETHASONE 4 MG PO TABS
20.0000 mg | ORAL_TABLET | Freq: Once | ORAL | Status: AC
  Administered 2023-09-02: 20 mg via ORAL
  Filled 2023-09-02: qty 5

## 2023-09-02 MED ORDER — ACETAMINOPHEN 325 MG PO TABS
650.0000 mg | ORAL_TABLET | Freq: Once | ORAL | Status: AC
Start: 2023-09-02 — End: 2023-09-02
  Administered 2023-09-02: 650 mg via ORAL
  Filled 2023-09-02: qty 2

## 2023-09-02 MED ORDER — DIPHENHYDRAMINE HCL 25 MG PO CAPS
50.0000 mg | ORAL_CAPSULE | Freq: Once | ORAL | Status: AC
Start: 2023-09-02 — End: 2023-09-02
  Administered 2023-09-02: 50 mg via ORAL
  Filled 2023-09-02: qty 2

## 2023-09-02 MED ORDER — SODIUM CHLORIDE 0.9% FLUSH
10.0000 mL | Freq: Once | INTRAVENOUS | Status: AC | PRN
  Administered 2023-09-02: 10 mL

## 2023-09-02 MED ORDER — SODIUM CHLORIDE 0.9 % IV SOLN: INTRAVENOUS | Status: DC

## 2023-09-02 MED ORDER — ZOLEDRONIC ACID 4 MG/100ML IV SOLN
4.0000 mg | Freq: Once | INTRAVENOUS | Status: AC
  Administered 2023-09-02: 4 mg via INTRAVENOUS
  Filled 2023-09-02: qty 100

## 2023-09-02 MED ORDER — SODIUM CHLORIDE 0.9% FLUSH
10.0000 mL | Freq: Once | INTRAVENOUS | Status: AC
  Administered 2023-09-02: 10 mL

## 2023-09-02 MED ORDER — HEPARIN SOD (PORK) LOCK FLUSH 100 UNIT/ML IV SOLN
500.0000 [IU] | Freq: Once | INTRAVENOUS | Status: AC | PRN
  Administered 2023-09-02: 500 [IU]

## 2023-09-02 NOTE — Assessment & Plan Note (Signed)
Multiple Myeloma Ig G Kappa MM, status post daratumumab Revlimid and dexamethasone induction followed by autologous stem cell transplant in September 2024. Post-transplant, PR1 with M spike reduction to 0.23 grams. MRD positive at 0.1593%. Plan for maintenance with daratumumab and Revlimid. -Start daratumumab and Revlimid maintenance therapy. Daratumumab subcu every 28 days, Revlimid 10 mg daily from day 1 to day 21 followed by 1 week off -Continue Acyclovir for a total of 1 year post-transplant. -Continue Bactrim DS for a total of 6 months post-transplant. -Order monthly multiple myeloma panel labs. Last spep with notable M protein, will follow up on today's labs. -Perform urine pregnancy test next week before processing the refill.  Daratumumab therapy Patient received daratumumab injection and experienced a week of itching, possibly related to the injection. -Continue daratumumab as scheduled. Plan for 2-3 yrs maintenance.  Bisphosphonate Patient is due for Zometa, which is scheduled every 12 weeks.  General Health Maintenance / Followup Plans -Schedule next monthly daratumumab for March 18th. -Schedule April appointment for daratumumab only. -Schedule May appointment for daratumumab and Zometa -Check-in with patient regarding Revlimid refill and urine pregnancy test results.

## 2023-09-02 NOTE — Progress Notes (Signed)
[facility] Cancer Follow up:    Kara Saint, MD [address] [place] [address]   DIAGNOSIS: Multiple Myeloma  SUMMARY OF ONCOLOGIC HISTORY: Oncology History  Multiple myeloma (HCC)  09/12/2022 Initial Diagnosis   Multiple myeloma (HCC)   09/20/2022 - 10/04/2022 Chemotherapy   Patient is on Treatment Plan : MYELOMA NEWLY DIAGNOSED TRANSPLANT CANDIDATE DaraVRd (Daratumumab IV) q21d x 6 Cycles (Induction/Consolidation)     10/11/2022 - 10/22/2022 Chemotherapy   Patient is on Treatment Plan : MYELOMA NON-TRANSPLANT CANDIDATES VRd SQ q21d     11/04/2022 - 03/04/2023 Chemotherapy   Patient is on Treatment Plan : MYELOMA NEWLY DIAGNOSED TRANSPLANT CANDIDATE VRd q21d starting cycle 6 (Induction/Consolidation)     07/31/2023 - 07/31/2023 Chemotherapy   Patient is on Treatment Plan : MYELOMA Daratumumab IV q28d     08/05/2023 -  Chemotherapy   Patient is on Treatment Plan : MYELOMA NEWLY DIAGNOSED TRANSPLANT CANDIDATE Daratumumab SQ + Lenalidomide q28d (Maintenance)      ASBMT risk stratification: Low Risk, PR1 ECOG/KPS: 0/90% HSCT-CI score: 2 (Obesity, Age >40) Preparative regimen/treatment protocol: Melphalan 200 mg/m2   03/26/2023 - Bone Marrow Transplant  AUTOLOGOUS STEM CELL TRANSPLANT: Disease status at time of transplant: PR1 ASBMT risk stratification: low risk, ECOG/KPS: 1 / 90%, HSCT-CI score: 2 Mobilization was done with G-CSF and Mozobil with a yield of 2.104 x 10(7) CD34+ cells.  Preparative regimen with Melphalan 200 mg/m2 on 03/25/2023 (outpatient). Infusion of stem cells - 7.01 x 10(6) on 03/26/2023 (outpatient) Completed stem cell process 04/08/2023. Course minimally complicated by mucositis.   ENGRAFTMENT: WBC engraftment on 04/06/2023 and platelet engraftment on 04/11/2023 at [facility]. Last platelet transfusion given 04/04/2023.  04/04/2023 - Supportive Treatment  Adult OP Blood Administration PRN x 30 DAYS Plan Provider: Reece Levy, MD   CURRENT THERAPY: DaraVRD  INTERVAL HISTORY:  Kara Medina is a 48 year old female with multiple myeloma who presents for follow-up on her treatment regimen.    She had autologous transplant on 03/26/2023.  She started post transplant revelimid on 1.30.2024  She is currently undergoing treatment for multiple myeloma with Revlimid (lenalidomide), Zometa, and daratumumab. She started her current cycle of Revlimid on January 30th and has two pills remaining, which she will complete today and tomorrow, followed by a week off before the next cycle. She receives daratumumab monthly and Zometa every twelve weeks.  She has experienced itching for about a week, which she attributes to the daratumumab injection, as she was informed it could be a side effect. This side effect was not present with previous IV administrations. No significant side effects from Revlimid this time.  Recent lab results show a slight presence of monoclonal protein.   Rest of the pertinent 10 point ROS reviewed and negative  Patient Active Problem List   Diagnosis Date Noted   Family history of mixed hyperlipidemia 01/30/2023   Obstructive sleep apnea 01/30/2023   Port-A-Cath in place 01/06/2023   HTN (hypertension) 11/14/2022   Multiple myeloma (HCC) 09/12/2022   MGUS (monoclonal gammopathy of unknown significance) 09/10/2022   Hypertensive urgency 08/29/2022   Iron deficiency anemia 08/29/2022   LVH (left ventricular hypertrophy) 08/29/2022   Normocytic anemia 08/29/2022    is allergic to tape and amoxicillin.  MEDICAL HISTORY: Past Medical History:  Diagnosis Date   Anemia    Gestational diabetes    diet controlled   Postpartum care following vaginal delivery (7/27) 02/09/2016   Pregnancy induced hypertension  Trigeminal neuralgia    2012 corrected with surgery    SURGICAL HISTORY: Past Surgical History:  Procedure Laterality Date   APPENDECTOMY  2008   IR IMAGING GUIDED  PORT INSERTION  12/03/2022   TRIGEMINAL NERVE DECOMPRESSION  2012    SOCIAL HISTORY: Social History   Socioeconomic History   Marital status: Married    Spouse name: Not on file   Number of children: Not on file   Years of education: Not on file   Highest education level: Bachelor's degree (e.g., BA, AB, BS)  Occupational History   Not on file  Tobacco Use   Smoking status: Never   Smokeless tobacco: Never  Vaping Use   Vaping status: Never Used  Substance and Sexual Activity   Alcohol use: No   Drug use: No   Sexual activity: Yes  Other Topics Concern   Not on file  Social History Narrative   Not on file   Social Drivers of Health   Financial Resource Strain: Low Risk  (08/23/2022)   Overall Financial Resource Strain (CARDIA)    Difficulty of Paying Living Expenses: Not hard at all  Food Insecurity: No Food Insecurity (08/23/2022)   Hunger Vital Sign    Worried About Running Out of Food in the Last Year: Never true    Ran Out of Food in the Last Year: Never true  Transportation Needs: No Transportation Needs (08/23/2022)   PRAPARE - Administrator, Civil Service (Medical): No    Lack of Transportation (Non-Medical): No  Physical Activity: Insufficiently Active (08/23/2022)   Exercise Vital Sign    Days of Exercise per Week: 2 days    Minutes of Exercise per Session: 30 min  Stress: Stress Concern Present (08/23/2022)   [employer] of Occupational Health - Occupational Stress Questionnaire    Feeling of Stress : To some extent  Social Connections: Unknown (08/23/2022)   Social Connection and Isolation Panel [NHANES]    Frequency of Communication with Friends and Family: More than three times a week    Frequency of Social Gatherings with Friends and Family: Once a week    Attends Religious Services: Patient declined    Database administrator or Organizations: Yes    Attends Engineer, structural: More than 4 times per year    Marital Status: Married   Catering manager Violence: Not on file    FAMILY HISTORY: Family History  Problem Relation Age of Onset   Cancer Other    Hypertension Other    Healthy Father    Cancer Paternal Grandfather        stomach, lung   Hypertension Mother    Breast cancer Mother 10   Hypertension Maternal Grandmother    Arthritis Maternal Grandmother    Hypertension Maternal Grandfather    Other Brother        ?prediabetic   Diabetes Paternal Grandmother    Hypertension Paternal Grandmother    Autism Son     Review of Systems  Constitutional:  Positive for fatigue. Negative for appetite change, chills, fever and unexpected weight change.  HENT:   Negative for hearing loss, lump/mass and trouble swallowing.   Eyes:  Negative for eye problems and icterus.  Respiratory:  Negative for chest tightness, cough and shortness of breath.   Cardiovascular:  Negative for chest pain, leg swelling and palpitations.  Gastrointestinal:  Negative for abdominal distention, abdominal pain, constipation, diarrhea, nausea and vomiting.  Endocrine: Negative for hot flashes.  Genitourinary:  Negative for difficulty urinating.   Musculoskeletal:  Negative for arthralgias.  Skin:  Negative for itching and rash.  Neurological:  Negative for dizziness, extremity weakness, headaches and numbness.  Hematological:  Negative for adenopathy. Does not bruise/bleed easily.  Psychiatric/Behavioral:  Negative for depression. The patient is not nervous/anxious.       PHYSICAL EXAMINATION    Vitals:   09/02/23 1131  BP: 112/73  Pulse: 91  Resp: 17  Temp: 98 F (36.7 C)  SpO2: 98%    Physical Exam Constitutional:      General: She is not in acute distress.    Appearance: Normal appearance. She is not toxic-appearing.  HENT:     Head: Normocephalic and atraumatic.     Mouth/Throat:     Mouth: Mucous membranes are moist.     Pharynx: Oropharynx is clear. No oropharyngeal exudate or posterior oropharyngeal erythema.   Eyes:     General: No scleral icterus. Cardiovascular:     Rate and Rhythm: Normal rate and regular rhythm.     Pulses: Normal pulses.     Heart sounds: Normal heart sounds.  Pulmonary:     Effort: Pulmonary effort is normal.     Breath sounds: Normal breath sounds.  Abdominal:     General: Abdomen is flat. Bowel sounds are normal. There is no distension.     Palpations: Abdomen is soft.     Tenderness: There is no abdominal tenderness.  Musculoskeletal:        General: No swelling.     Cervical back: Neck supple.  Lymphadenopathy:     Cervical: No cervical adenopathy.  Skin:    General: Skin is warm and dry.     Findings: No rash.  Neurological:     General: No focal deficit present.     Mental Status: She is alert.  Psychiatric:        Mood and Affect: Mood normal.        Behavior: Behavior normal.     LABORATORY DATA:  CBC    Component Value Date/Time   WBC 3.2 (L) 09/02/2023 1054   WBC 3.4 (L) 05/30/2023 0852   RBC 3.78 (L) 09/02/2023 1054   HGB 11.3 (L) 09/02/2023 1054   HCT 34.7 (L) 09/02/2023 1054   HCT 24.4 (L) 09/04/2022 1037   PLT 194 09/02/2023 1054   MCV 91.8 09/02/2023 1054   MCH 29.9 09/02/2023 1054   MCHC 32.6 09/02/2023 1054   RDW 12.2 09/02/2023 1054   LYMPHSABS 0.9 09/02/2023 1054   MONOABS 0.5 09/02/2023 1054   EOSABS 0.2 09/02/2023 1054   BASOSABS 0.1 09/02/2023 1054    CMP     Component Value Date/Time   NA 140 09/02/2023 1054   K 3.9 09/02/2023 1054   CL 108 09/02/2023 1054   CO2 27 09/02/2023 1054   GLUCOSE 96 09/02/2023 1054   BUN 15 09/02/2023 1054   CREATININE 0.92 09/02/2023 1054   CREATININE 0.95 08/23/2022 1631   CALCIUM 9.0 09/02/2023 1054   PROT 6.3 (L) 09/02/2023 1054   ALBUMIN 4.2 09/02/2023 1054   AST 19 09/02/2023 1054   ALT 18 09/02/2023 1054   ALKPHOS 45 09/02/2023 1054   BILITOT 0.5 09/02/2023 1054   GFRNONAA >60 09/02/2023 1054   GFRAA >60 02/09/2016 0525   ASSESSMENT and THERAPY PLAN:   Multiple  myeloma (HCC) Multiple Myeloma Ig G Kappa MM, status post daratumumab Revlimid and dexamethasone induction followed by autologous stem cell transplant in September 2024.  Post-transplant, PR1 with M spike reduction to 0.23 grams. MRD positive at 0.1593%. Plan for maintenance with daratumumab and Revlimid. -Start daratumumab and Revlimid maintenance therapy. Daratumumab subcu every 28 days, Revlimid 10 mg daily from day 1 to day 21 followed by 1 week off -Continue Acyclovir for a total of 1 year post-transplant. -Continue Bactrim DS for a total of 6 months post-transplant. -Order monthly multiple myeloma panel labs. Last spep with notable M protein, will follow up on today's labs. -Perform urine pregnancy test next week before processing the refill.  Daratumumab therapy Patient received daratumumab injection and experienced a week of itching, possibly related to the injection. -Continue daratumumab as scheduled. Plan for 2-3 yrs maintenance.  Bisphosphonate Patient is due for Zometa, which is scheduled every 12 weeks.  General Health Maintenance / Followup Plans -Schedule next monthly daratumumab for March 18th. -Schedule April appointment for daratumumab only. -Schedule May appointment for daratumumab and Zometa -Check-in with patient regarding Revlimid refill and urine pregnancy test results.    All questions were answered. The patient knows to call the clinic with any problems, questions or concerns. We can certainly see the patient much sooner if necessary.  Total encounter time:30 minutes*in face-to-face visit time, chart review, lab review, care coordination, order entry, and documentation of the encounter time.   *Total Encounter Time as defined by the Centers for Medicare and Medicaid Services includes, in addition to the face-to-face time of a patient visit (documented in the note above) non-face-to-face time: obtaining and reviewing outside history, ordering and reviewing  medications, tests or procedures, care coordination (communications with other health care professionals or caregivers) and documentation in the medical record.

## 2023-09-03 ENCOUNTER — Other Ambulatory Visit: Payer: Self-pay | Admitting: Hematology and Oncology

## 2023-09-03 DIAGNOSIS — C9000 Multiple myeloma not having achieved remission: Secondary | ICD-10-CM

## 2023-09-03 DIAGNOSIS — D472 Monoclonal gammopathy: Secondary | ICD-10-CM

## 2023-09-03 LAB — KAPPA/LAMBDA LIGHT CHAINS
Kappa free light chain: 15.7 mg/L (ref 3.3–19.4)
Kappa, lambda light chain ratio: 2.04 — ABNORMAL HIGH (ref 0.26–1.65)
Lambda free light chains: 7.7 mg/L (ref 5.7–26.3)

## 2023-09-03 LAB — IGG, IGA, IGM
IgA: 85 mg/dL — ABNORMAL LOW (ref 87–352)
IgG (Immunoglobin G), Serum: 810 mg/dL (ref 586–1602)
IgM (Immunoglobulin M), Srm: 38 mg/dL (ref 26–217)

## 2023-09-04 LAB — BETA 2 MICROGLOBULIN, SERUM: Beta-2 Microglobulin: 1.4 mg/L (ref 0.6–2.4)

## 2023-09-08 ENCOUNTER — Other Ambulatory Visit: Payer: Self-pay | Admitting: *Deleted

## 2023-09-08 DIAGNOSIS — C9000 Multiple myeloma not having achieved remission: Secondary | ICD-10-CM

## 2023-09-09 ENCOUNTER — Other Ambulatory Visit: Payer: Self-pay | Admitting: *Deleted

## 2023-09-09 ENCOUNTER — Inpatient Hospital Stay: Payer: 59

## 2023-09-09 DIAGNOSIS — C9001 Multiple myeloma in remission: Secondary | ICD-10-CM

## 2023-09-09 DIAGNOSIS — C9000 Multiple myeloma not having achieved remission: Secondary | ICD-10-CM

## 2023-09-09 DIAGNOSIS — D472 Monoclonal gammopathy: Secondary | ICD-10-CM

## 2023-09-09 DIAGNOSIS — Z5112 Encounter for antineoplastic immunotherapy: Secondary | ICD-10-CM | POA: Diagnosis not present

## 2023-09-09 LAB — PREGNANCY, URINE: Preg Test, Ur: NEGATIVE

## 2023-09-09 MED ORDER — LENALIDOMIDE 10 MG PO CAPS: 10.0000 mg | ORAL_CAPSULE | Freq: Every day | ORAL | 0 refills | Status: DC

## 2023-09-11 LAB — IMMUNOFIXATION REFLEX, SERUM
IgA: 86 mg/dL — ABNORMAL LOW (ref 87–352)
IgG (Immunoglobin G), Serum: 814 mg/dL (ref 586–1602)
IgM (Immunoglobulin M), Srm: 36 mg/dL (ref 26–217)

## 2023-09-11 LAB — PROTEIN ELECTROPHORESIS, SERUM, WITH REFLEX
A/G Ratio: 1.5 (ref 0.7–1.7)
Albumin ELP: 3.7 g/dL (ref 2.9–4.4)
Alpha-1-Globulin: 0.2 g/dL (ref 0.0–0.4)
Alpha-2-Globulin: 0.7 g/dL (ref 0.4–1.0)
Beta Globulin: 0.9 g/dL (ref 0.7–1.3)
Gamma Globulin: 0.7 g/dL (ref 0.4–1.8)
Globulin, Total: 2.5 g/dL (ref 2.2–3.9)
M-Spike, %: 0.3 g/dL — ABNORMAL HIGH
SPEP Interpretation: 0
Total Protein ELP: 6.2 g/dL (ref 6.0–8.5)

## 2023-09-18 ENCOUNTER — Other Ambulatory Visit: Payer: Self-pay

## 2023-09-29 ENCOUNTER — Other Ambulatory Visit: Payer: Self-pay | Admitting: Family Medicine

## 2023-09-29 DIAGNOSIS — Z0000 Encounter for general adult medical examination without abnormal findings: Secondary | ICD-10-CM

## 2023-09-30 ENCOUNTER — Encounter: Payer: Self-pay | Admitting: Hematology and Oncology

## 2023-09-30 ENCOUNTER — Ambulatory Visit: Payer: 59

## 2023-09-30 ENCOUNTER — Telehealth: Payer: Self-pay

## 2023-09-30 ENCOUNTER — Other Ambulatory Visit: Payer: 59

## 2023-09-30 ENCOUNTER — Ambulatory Visit: Payer: 59 | Admitting: Hematology and Oncology

## 2023-09-30 NOTE — Telephone Encounter (Signed)
 Spoke with patient about appointment for today.. she stated that she had a death in the family early this morning stated she had called and left Dr Al Pimple a message.  I sent a message to Dr Al Pimple and told her and also sent a message to Leonette Most to have her rescheduled.  Per the patient nothing this week because they would be busy making arrangements and having a funeral.

## 2023-10-01 ENCOUNTER — Inpatient Hospital Stay

## 2023-10-01 ENCOUNTER — Ambulatory Visit: Admitting: Hematology and Oncology

## 2023-10-01 ENCOUNTER — Inpatient Hospital Stay: Attending: Hematology and Oncology

## 2023-10-01 ENCOUNTER — Encounter: Payer: Self-pay | Admitting: *Deleted

## 2023-10-01 ENCOUNTER — Other Ambulatory Visit: Payer: Self-pay | Admitting: *Deleted

## 2023-10-01 VITALS — BP 134/84 | HR 91 | Temp 98.4°F | Resp 18 | Wt 244.0 lb

## 2023-10-01 DIAGNOSIS — Z95828 Presence of other vascular implants and grafts: Secondary | ICD-10-CM

## 2023-10-01 DIAGNOSIS — Z5112 Encounter for antineoplastic immunotherapy: Secondary | ICD-10-CM | POA: Diagnosis present

## 2023-10-01 DIAGNOSIS — D472 Monoclonal gammopathy: Secondary | ICD-10-CM

## 2023-10-01 DIAGNOSIS — C9000 Multiple myeloma not having achieved remission: Secondary | ICD-10-CM

## 2023-10-01 DIAGNOSIS — C9001 Multiple myeloma in remission: Secondary | ICD-10-CM

## 2023-10-01 DIAGNOSIS — Z79620 Long term (current) use of immunosuppressive biologic: Secondary | ICD-10-CM | POA: Diagnosis not present

## 2023-10-01 LAB — CMP (CANCER CENTER ONLY)
ALT: 17 U/L (ref 0–44)
AST: 17 U/L (ref 15–41)
Albumin: 4.1 g/dL (ref 3.5–5.0)
Alkaline Phosphatase: 45 U/L (ref 38–126)
Anion gap: 3 — ABNORMAL LOW (ref 5–15)
BUN: 12 mg/dL (ref 6–20)
CO2: 28 mmol/L (ref 22–32)
Calcium: 8.7 mg/dL — ABNORMAL LOW (ref 8.9–10.3)
Chloride: 106 mmol/L (ref 98–111)
Creatinine: 0.7 mg/dL (ref 0.44–1.00)
GFR, Estimated: >60 mL/min (ref >60)
Glucose, Bld: 118 mg/dL — ABNORMAL HIGH (ref 70–99)
Potassium: 3.8 mmol/L (ref 3.5–5.1)
Sodium: 137 mmol/L (ref 135–145)
Total Bilirubin: 0.4 mg/dL (ref 0.0–1.2)
Total Protein: 6.2 g/dL — ABNORMAL LOW (ref 6.5–8.1)

## 2023-10-01 LAB — CBC WITH DIFFERENTIAL (CANCER CENTER ONLY)
Abs Immature Granulocytes: 0.01 10*3/uL (ref 0.00–0.07)
Basophils Absolute: 0.1 10*3/uL (ref 0.0–0.1)
Basophils Relative: 2 %
Eosinophils Absolute: 0.6 10*3/uL — ABNORMAL HIGH (ref 0.0–0.5)
Eosinophils Relative: 13 %
HCT: 33 % — ABNORMAL LOW (ref 36.0–46.0)
Hemoglobin: 10.9 g/dL — ABNORMAL LOW (ref 12.0–15.0)
Immature Granulocytes: 0 %
Lymphocytes Relative: 21 %
Lymphs Abs: 0.9 10*3/uL (ref 0.7–4.0)
MCH: 30.2 pg (ref 26.0–34.0)
MCHC: 33 g/dL (ref 30.0–36.0)
MCV: 91.4 fL (ref 80.0–100.0)
Monocytes Absolute: 0.9 10*3/uL (ref 0.1–1.0)
Monocytes Relative: 21 %
Neutro Abs: 1.8 10*3/uL (ref 1.7–7.7)
Neutrophils Relative %: 43 %
Platelet Count: 204 10*3/uL (ref 150–400)
RBC: 3.61 MIL/uL — ABNORMAL LOW (ref 3.87–5.11)
RDW: 12.4 % (ref 11.5–15.5)
WBC Count: 4.2 10*3/uL (ref 4.0–10.5)
nRBC: 0 % (ref 0.0–0.2)

## 2023-10-01 LAB — PREGNANCY, URINE: Preg Test, Ur: NEGATIVE

## 2023-10-01 MED ORDER — ACETAMINOPHEN 325 MG PO TABS
650.0000 mg | ORAL_TABLET | Freq: Once | ORAL | Status: AC
  Administered 2023-10-01: 650 mg via ORAL
  Filled 2023-10-01: qty 2

## 2023-10-01 MED ORDER — LENALIDOMIDE 10 MG PO CAPS
10.0000 mg | ORAL_CAPSULE | Freq: Every day | ORAL | 0 refills | Status: DC
Start: 2023-10-01 — End: 2023-10-28

## 2023-10-01 MED ORDER — DEXAMETHASONE 4 MG PO TABS
20.0000 mg | ORAL_TABLET | Freq: Once | ORAL | Status: AC
  Administered 2023-10-01: 20 mg via ORAL
  Filled 2023-10-01: qty 5

## 2023-10-01 MED ORDER — SODIUM CHLORIDE 0.9% FLUSH
10.0000 mL | Freq: Once | INTRAVENOUS | Status: AC
  Administered 2023-10-01: 10 mL

## 2023-10-01 MED ORDER — DIPHENHYDRAMINE HCL 25 MG PO CAPS
50.0000 mg | ORAL_CAPSULE | Freq: Once | ORAL | Status: AC
  Administered 2023-10-01: 50 mg via ORAL
  Filled 2023-10-01: qty 2

## 2023-10-01 MED ORDER — DARATUMUMAB-HYALURONIDASE-FIHJ 1800-30000 MG-UT/15ML ~~LOC~~ SOLN
1800.0000 mg | Freq: Once | SUBCUTANEOUS | Status: AC
  Administered 2023-10-01: 1800 mg via SUBCUTANEOUS
  Filled 2023-10-01: qty 15

## 2023-10-01 NOTE — Patient Instructions (Signed)
 [facility]  Discharge Instructions: Thank you for choosing [facility] to provide your oncology and hematology care.   If you have a lab appointment with the Cancer Center, please go directly to the Cancer Center and check in at the registration area.   Wear comfortable clothing and clothing appropriate for easy access to any Portacath or PICC line.   We strive to give you quality time with your provider. You may need to reschedule your appointment if you arrive late (15 or more minutes).  Arriving late affects you and other patients whose appointments are after yours.  Also, if you miss three or more appointments without notifying the office, you may be dismissed from the clinic at the provider's discretion.      For prescription refill requests, have your pharmacy contact our office and allow 72 hours for refills to be completed.    Today you received the following chemotherapy and/or immunotherapy agents: daratumumab-hyaluronidase-fihj      To help prevent nausea and vomiting after your treatment, we encourage you to take your nausea medication as directed.  BELOW ARE SYMPTOMS THAT SHOULD BE REPORTED IMMEDIATELY: *FEVER GREATER THAN 100.4 F (38 C) OR HIGHER *CHILLS OR SWEATING *NAUSEA AND VOMITING THAT IS NOT CONTROLLED WITH YOUR NAUSEA MEDICATION *UNUSUAL SHORTNESS OF BREATH *UNUSUAL BRUISING OR BLEEDING *URINARY PROBLEMS (pain or burning when urinating, or frequent urination) *BOWEL PROBLEMS (unusual diarrhea, constipation, pain near the anus) TENDERNESS IN MOUTH AND THROAT WITH OR WITHOUT PRESENCE OF ULCERS (sore throat, sores in mouth, or a toothache) UNUSUAL RASH, SWELLING OR PAIN  UNUSUAL VAGINAL DISCHARGE OR ITCHING   Items with * indicate a potential emergency and should be followed up as soon as possible or go to the Emergency Department if any problems should occur.  Please show the CHEMOTHERAPY ALERT  CARD or IMMUNOTHERAPY ALERT CARD at check-in to the Emergency Department and triage nurse.  Should you have questions after your visit or need to cancel or reschedule your appointment, please contact [facility] - A DEPT OF [facility]  Dept: [phone]  and follow the prompts.  Office hours are 8:00 a.m. to 4:30 p.m. Monday - Friday. Please note that voicemails left after 4:00 p.m. may not be returned until the following business day.  We are closed weekends and major holidays. You have access to a nurse at all times for urgent questions. Please call the main number to the clinic Dept: [phone] and follow the prompts.   For any non-urgent questions, you may also contact your provider using MyChart. We now offer e-Visits for anyone 37 and older to request care online for non-urgent symptoms. For details visit [email or web address].   Also download the MyChart app! Go to the app store, search "MyChart", open the app, select [place], and log in with your MyChart username and password.

## 2023-10-02 LAB — KAPPA/LAMBDA LIGHT CHAINS
Kappa free light chain: 15.3 mg/L (ref 3.3–19.4)
Kappa, lambda light chain ratio: 1.96 — ABNORMAL HIGH (ref 0.26–1.65)
Lambda free light chains: 7.8 mg/L (ref 5.7–26.3)

## 2023-10-02 LAB — IGG, IGA, IGM
IgA: 115 mg/dL (ref 87–352)
IgG (Immunoglobin G), Serum: 709 mg/dL (ref 586–1602)
IgM (Immunoglobulin M), Srm: 34 mg/dL (ref 26–217)

## 2023-10-02 LAB — BETA 2 MICROGLOBULIN, SERUM: Beta-2 Microglobulin: 1.1 mg/L (ref 0.6–2.4)

## 2023-10-05 ENCOUNTER — Other Ambulatory Visit: Payer: Self-pay | Admitting: Internal Medicine

## 2023-10-06 LAB — IMMUNOFIXATION REFLEX, SERUM
IgA: 113 mg/dL (ref 87–352)
IgG (Immunoglobin G), Serum: 721 mg/dL (ref 586–1602)
IgM (Immunoglobulin M), Srm: 35 mg/dL (ref 26–217)

## 2023-10-06 LAB — PROTEIN ELECTROPHORESIS, SERUM, WITH REFLEX
A/G Ratio: 1.4 (ref 0.7–1.7)
Albumin ELP: 3.6 g/dL (ref 2.9–4.4)
Alpha-1-Globulin: 0.2 g/dL (ref 0.0–0.4)
Alpha-2-Globulin: 0.7 g/dL (ref 0.4–1.0)
Beta Globulin: 0.9 g/dL (ref 0.7–1.3)
Gamma Globulin: 0.7 g/dL (ref 0.4–1.8)
Globulin, Total: 2.5 g/dL (ref 2.2–3.9)
M-Spike, %: 0.3 g/dL — ABNORMAL HIGH
SPEP Interpretation: 0
Total Protein ELP: 6.1 g/dL (ref 6.0–8.5)

## 2023-10-07 ENCOUNTER — Telehealth: Payer: Self-pay | Admitting: Internal Medicine

## 2023-10-07 ENCOUNTER — Telehealth: Payer: Self-pay

## 2023-10-07 NOTE — Telephone Encounter (Signed)
 Notified Patient of prior authorization approval for Revlimid 10mg  Capsules (Lenalidomide). Medication is approved through 10/06/2024. No other needs or concerns noted at this time.

## 2023-10-07 NOTE — Telephone Encounter (Signed)
*  STAT* If patient is at the pharmacy, call can be transferred to refill team.   1. Which medications need to be refilled? (please list name of each medication and dose if known) Labetalol   2. Would you like to learn more about the convenience, safety, & potential cost savings by using the [facility]?      3. Are you open to using the [facility] (Type [facility]   4. Which pharmacy/location (including street and city if local pharmacy) is medication to be sent to? [facility] [place] [facility]   5. Do they need a 30 day or 90 day supply? 30 days #60 and refills

## 2023-10-14 ENCOUNTER — Other Ambulatory Visit: Payer: Self-pay

## 2023-10-23 ENCOUNTER — Ambulatory Visit: Payer: 59

## 2023-10-23 ENCOUNTER — Other Ambulatory Visit: Payer: 59

## 2023-10-23 ENCOUNTER — Ambulatory Visit: Payer: 59 | Admitting: Hematology and Oncology

## 2023-10-27 ENCOUNTER — Emergency Department (HOSPITAL_COMMUNITY)
Admission: EM | Admit: 2023-10-27 | Discharge: 2023-10-27 | Disposition: A | Discharge status: Home / Self Care | Attending: Emergency Medicine | Admitting: Emergency Medicine

## 2023-10-27 ENCOUNTER — Other Ambulatory Visit: Payer: Self-pay

## 2023-10-27 ENCOUNTER — Emergency Department (HOSPITAL_COMMUNITY)

## 2023-10-27 DIAGNOSIS — I10 Essential (primary) hypertension: Secondary | ICD-10-CM | POA: Insufficient documentation

## 2023-10-27 DIAGNOSIS — R42 Dizziness and giddiness: Secondary | ICD-10-CM | POA: Diagnosis present

## 2023-10-27 DIAGNOSIS — Z79899 Other long term (current) drug therapy: Secondary | ICD-10-CM | POA: Insufficient documentation

## 2023-10-27 DIAGNOSIS — Z8579 Personal history of other malignant neoplasms of lymphoid, hematopoietic and related tissues: Secondary | ICD-10-CM | POA: Insufficient documentation

## 2023-10-27 LAB — COMPREHENSIVE METABOLIC PANEL WITH GFR
ALT: 21 U/L (ref 0–44)
AST: 23 U/L (ref 15–41)
Albumin: 4.3 g/dL (ref 3.5–5.0)
Alkaline Phosphatase: 43 U/L (ref 38–126)
Anion gap: 9 (ref 5–15)
BUN: 16 mg/dL (ref 6–20)
CO2: 23 mmol/L (ref 22–32)
Calcium: 8.9 mg/dL (ref 8.9–10.3)
Chloride: 106 mmol/L (ref 98–111)
Creatinine, Ser: 0.57 mg/dL (ref 0.44–1.00)
GFR, Estimated: >60 mL/min (ref >60)
Glucose, Bld: 106 mg/dL — ABNORMAL HIGH (ref 70–99)
Potassium: 4 mmol/L (ref 3.5–5.1)
Sodium: 138 mmol/L (ref 135–145)
Total Bilirubin: 0.8 mg/dL (ref 0.0–1.2)
Total Protein: 7.3 g/dL (ref 6.5–8.1)

## 2023-10-27 LAB — CBC WITH DIFFERENTIAL/PLATELET
Abs Immature Granulocytes: 0.03 10*3/uL (ref 0.00–0.07)
Basophils Absolute: 0.1 10*3/uL (ref 0.0–0.1)
Basophils Relative: 1 %
Eosinophils Absolute: 0.2 10*3/uL (ref 0.0–0.5)
Eosinophils Relative: 3 %
HCT: 38.7 % (ref 36.0–46.0)
Hemoglobin: 12.3 g/dL (ref 12.0–15.0)
Immature Granulocytes: 1 %
Lymphocytes Relative: 16 %
Lymphs Abs: 0.9 10*3/uL (ref 0.7–4.0)
MCH: 29.8 pg (ref 26.0–34.0)
MCHC: 31.8 g/dL (ref 30.0–36.0)
MCV: 93.7 fL (ref 80.0–100.0)
Monocytes Absolute: 0.7 10*3/uL (ref 0.1–1.0)
Monocytes Relative: 12 %
Neutro Abs: 3.7 10*3/uL (ref 1.7–7.7)
Neutrophils Relative %: 67 %
Platelets: 210 10*3/uL (ref 150–400)
RBC: 4.13 MIL/uL (ref 3.87–5.11)
RDW: 13.1 % (ref 11.5–15.5)
WBC: 5.5 10*3/uL (ref 4.0–10.5)
nRBC: 0 % (ref 0.0–0.2)

## 2023-10-27 LAB — HCG, SERUM, QUALITATIVE: Preg, Serum: NEGATIVE

## 2023-10-27 LAB — LIPASE, BLOOD: Lipase: 35 U/L (ref 11–51)

## 2023-10-27 MED ORDER — ONDANSETRON 4 MG PO TBDP: 4.0000 mg | ORAL_TABLET | Freq: Three times a day (TID) | ORAL | 0 refills | Status: AC | PRN

## 2023-10-27 MED ORDER — SODIUM CHLORIDE 0.9 % IV BOLUS
1000.0000 mL | Freq: Once | INTRAVENOUS | Status: AC
  Administered 2023-10-27: 1000 mL via INTRAVENOUS

## 2023-10-27 MED ORDER — GADOBUTROL 1 MMOL/ML IV SOLN
7.0000 mL | Freq: Once | INTRAVENOUS | Status: AC | PRN
  Administered 2023-10-27: 7 mL via INTRAVENOUS

## 2023-10-27 MED ORDER — MECLIZINE HCL 25 MG PO TABS: 25.0000 mg | ORAL_TABLET | Freq: Three times a day (TID) | ORAL | 0 refills | Status: DC | PRN

## 2023-10-27 MED ORDER — MECLIZINE HCL 25 MG PO TABS
25.0000 mg | ORAL_TABLET | Freq: Once | ORAL | Status: AC
  Administered 2023-10-27: 25 mg via ORAL
  Filled 2023-10-27: qty 1

## 2023-10-27 NOTE — ED Provider Notes (Signed)
 Kara Medina Provider Note   CSN: [id number] Arrival date & time: 10/27/23  0543     History  Chief Complaint  Patient presents with   Nausea   Dizziness   Emesis    Kara Medina is a 48 y.o. female.  Patient with past medical history of hypertension, iron deficiency anemia, multiple myeloma presenting to the emergency room with complaint of dizziness.  This is associated with nausea vomiting and  two loose stools.  Patient reports that around 5 hours ago she woke up to go to the bathroom and noticed room spinning sensation.  She reports that she felt quite unstable when she was walking to the bathroom and got very nauseous.  She vomited 2 times and then called EMS. She reports that since this happened she is felt quite unsteady on her feet and had to use wheel chair. She notes this seems constant but is much better with rest and worse with ambulation or turning head sided to side. She has not had blurry vision or change in vision.  Denies any focal weakness or change in sensation.  Symptoms improved slightly after Zofran. No new medications, has been on MM treatment for 1 year without complications.   Dizziness Associated symptoms: vomiting   Emesis      Home Medications Prior to Admission medications   Medication Sig Start Date End Date Taking? Authorizing Provider  acyclovir (ZOVIRAX) 200 MG capsule Take 200 mg by mouth 2 (two) times daily.    [provider]  acyclovir (ZOVIRAX) 400 MG tablet Take 1 tablet (400 mg total) by mouth 2 (two) times daily. 07/31/23   Iruku, Praveena, MD  amLODipine (NORVASC) 5 MG tablet Take 1 tablet (5 mg total) by mouth daily. 06/06/23   Jann Melody, MD  aspirin EC 81 MG tablet Take 81 mg by mouth daily. Swallow whole.    [provider]  Blood Pressure Monitoring (BLOOD PRESSURE CUFF) MISC 1 Device by Does not apply route daily. 08/23/22   Viola Greulich, MD  calcium  carbonate (TUMS EX) 750 MG chewable tablet Chew 2 tablets by mouth 2 (two) times daily.    [provider]  cyclobenzaprine (FLEXERIL) 5 MG tablet Take 1 tablet (5 mg total) by mouth 3 (three) times daily as needed for muscle spasms. 09/27/22   Iruku, Praveena, MD  dexamethasone (DECADRON) 4 MG tablet Take by mouth. 12/01/22   [provider]  folic acid-vitamin b complex-vitamin c-selenium-zinc (DIALYVITE) 3 MG TABS tablet Take 1 tablet by mouth daily.    [provider]  gabapentin (NEURONTIN) 300 MG capsule Take 1 capsule (300 mg total) by mouth at bedtime. 07/24/23   Iruku, Praveena, MD  ibuprofen (ADVIL) 200 MG tablet Take 400 mg by mouth every 6 (six) hours as needed for headache or moderate pain.    [provider]  labetalol (NORMODYNE) 200 MG tablet TAKE 1 TABLET(200 MG) BY MOUTH TWICE DAILY 10/06/23   Chandrasekhar, Mahesh A, MD  lenalidomide (REVLIMID) 10 MG capsule Take 1 capsule (10 mg total) by mouth daily. [employer] Auth #  [id number]  Date Obtained 10/01/2023 10/01/23   Iruku, Praveena, MD  lidocaine-prilocaine (EMLA) cream Apply topically once. 12/08/22   [provider]  lidocaine-prilocaine (EMLA) cream Apply to affected area once 07/31/23   Iruku, Praveena, MD  ondansetron (ZOFRAN) 8 MG tablet  09/16/22   [provider]  ondansetron (ZOFRAN) 8 MG tablet Take 1 tablet (8  mg total) by mouth every 8 (eight) hours as needed for nausea or vomiting. 07/31/23   Rachel Moulds, MD  prochlorperazine (COMPAZINE) 10 MG tablet Take 1 tablet (10 mg total) by mouth every 6 (six) hours as needed for nausea or vomiting. 07/31/23   Rachel Moulds, MD  traMADol (ULTRAM) 50 MG tablet Take 1 tablet (50 mg total) by mouth every 12 (twelve) hours as needed. 09/27/22   Rachel Moulds, MD      Allergies    Tape and Amoxicillin    Review of Systems   Review of Systems  Gastrointestinal:  Positive for vomiting.  Neurological:  Positive for dizziness.     Physical Exam Updated Vital Signs BP (!) 153/92   Pulse 81   Temp 98.3 F (36.8 C) (Oral)   Resp 18   Ht 5\' 6"  (1.676 m)   Wt 111.1 kg   SpO2 99%   BMI 39.54 kg/m  Physical Exam Vitals and nursing note reviewed.  Constitutional:      General: She is not in acute distress.    Appearance: She is not toxic-appearing.  HENT:     Head: Normocephalic and atraumatic.  Eyes:     General: No scleral icterus.    Conjunctiva/sclera: Conjunctivae normal.  Cardiovascular:     Rate and Rhythm: Normal rate and regular rhythm.     Pulses: Normal pulses.     Heart sounds: Normal heart sounds.  Pulmonary:     Effort: Pulmonary effort is normal. No respiratory distress.     Breath sounds: Normal breath sounds.  Abdominal:     General: Abdomen is flat. Bowel sounds are normal.     Palpations: Abdomen is soft.     Tenderness: There is no abdominal tenderness.  Musculoskeletal:     Right lower leg: No edema.     Left lower leg: No edema.  Skin:    General: Skin is warm and dry.     Findings: No lesion.  Neurological:     General: No focal deficit present.     Mental Status: She is alert and oriented to person, place, and time. Mental status is at baseline.     Comments: Patient's pupils equal and reactive with no nystagmus.  Alert and oriented with no slurred speech. Upper extremity sensation and strength is equal bilaterally no ataxia on finger-to-nose Lower extremity sensation and strength is equal bilaterally. Ambulatory with somewhat unsteady gait.      ED Results / Procedures / Treatments   Labs (all labs ordered are listed, but only abnormal results are displayed) Labs Reviewed  COMPREHENSIVE METABOLIC PANEL WITH GFR - Abnormal; Notable for the following components:      Result Value   Glucose, Bld 106 (*)    All other components within normal limits  LIPASE, BLOOD  CBC WITH DIFFERENTIAL/PLATELET  HCG, SERUM, QUALITATIVE  URINALYSIS, ROUTINE W REFLEX MICROSCOPIC     EKG None  Radiology MR Brain W and Wo Contrast Result Date: 10/27/2023 CLINICAL DATA:  Provided history: Stroke, follow-up. Additional history obtained from electronic MEDICAL RECORD NUMBERDizziness. Nausea. Vomiting/dry heaving. Diarrhea. Undergoing chemotherapy. EXAM: MRI HEAD WITHOUT AND WITH CONTRAST TECHNIQUE: Multiplanar, multiecho pulse sequences of the brain and surrounding structures were obtained without and with intravenous contrast. CONTRAST:  7mL GADAVIST GADOBUTROL 1 MMOL/ML IV SOLN COMPARISON:  Head CT 10/27/2023. FINDINGS: Brain: No age-advanced or lobar predominant cerebral atrophy. Mild multifocal T2 FLAIR hyperintense signal abnormality within the bilateral frontal lobe white matter. Expanded and partially empty  sella turcica. No cortical encephalomalacia is identified. There is no acute infarct. No evidence of an intracranial mass. No chronic intracranial blood products. No extra-axial fluid collection. No midline shift. No pathologic intracranial enhancement identified. Vascular: Maintained flow voids within the proximal large arterial vessels. Skull and upper cervical spine: Prior left retrosigmoid craniotomy. No focal worrisome marrow lesion. Incompletely assessed cervical spondylosis. Sinuses/Orbits: No mass or acute finding within the imaged orbits. Trace mucosal thickening within the left ethmoid and right maxillary sinuses. IMPRESSION: 1.  No evidence of an acute intracranial abnormality. 2. Mild T2 FLAIR hyperintense signal changes within the bilateral frontal lobe white matter. These signal changes are nonspecific and differential considerations include chronic small vessel ischemic disease, sequelae of chronic migraine headaches, sequelae of a prior infectious/inflammatory process and sequelae of demyelinating disease, among others. 3. Expanded and partially empty sella turcica. This finding can reflect incidental anatomic variation, or alternatively, it can be associated with  chronic idiopathic intracranial hypertension (pseudotumor cerebri). 4. Prior left retrosigmoid craniotomy, presumably from prior trigeminal nerve decompression (in this patient with a history of trigeminal neuralgia). Electronically Signed   By: Jackey Loge D.O.   On: 10/27/2023 11:49   CT Head Wo Contrast Result Date: 10/27/2023 CLINICAL DATA:  Headache, increasing frequency or severity, vertigo EXAM: CT HEAD WITHOUT CONTRAST TECHNIQUE: Contiguous axial images were obtained from the base of the skull through the vertex without intravenous contrast. RADIATION DOSE REDUCTION: This exam was performed according to the departmental dose-optimization program which includes automated exposure control, adjustment of the mA and/or kV according to patient size and/or use of iterative reconstruction technique. COMPARISON:  Head CT 10/21/2022 FINDINGS: Brain: No evidence of acute infarction, hemorrhage, hydrocephalus, extra-axial collection or mass lesion/mass effect. Partially empty sella, nonspecific. Vascular: No hyperdense vessel or unexpected calcification. Skull: Prior left retromastoid craniotomy high density near the left CP angle cistern presumably from trigeminal nerve decompression in this patient with history of trigeminal neuralgia surgery in 2012. Sinuses/Orbits: Negative IMPRESSION: No acute or interval finding. Electronically Signed   By: Tiburcio Pea M.D.   On: 10/27/2023 08:11    Procedures Procedures    Medications Ordered in ED Medications - No data to display  ED Course/ Medical Decision Making/ A&P Clinical Course as of 10/27/23 1616  Mon Oct 27, 2023  1036 Reassessed and has not had much improvement with dizziness.  Will obtain MRI to rule out central vertigo.  No further episodes of vomiting and nausea has improved. [JB]    Clinical Course User Index [JB] Dietrich Samuelson, Horald Chestnut, PA-C                                 Medical Decision Making Amount and/or Complexity of Data  Reviewed Labs: ordered. Radiology: ordered.  Risk Prescription drug management.   This patient presents to the ED for concern of N/V, dizziness, this involves an extensive number of treatment options, and is a complaint that carries with it a high risk of complications and morbidity.  The differential diagnosis includes benign paroxysmal positional vertigo, labyrinthitis, Mnire's disease, cerebellar stroke, anemia, dehydration, electrolyte abnormality   Co morbidities that complicate the patient evaluation  MM   Additional history obtained:  Additional history obtained from 09/02/23 OV   Lab Tests:  I personally interpreted labs.  The pertinent results include:       Imaging Studies ordered:  I ordered imaging studies including Ct head, MR brain  I independently visualized and interpreted imaging which showed no acute findings. No acute infarct  I agree with the radiologist interpretation   Cardiac Monitoring: / EKG:  The patient was maintained on a cardiac monitor.    Problem List / ED Course / Critical interventions / Medication management  Patient reporting with dizziness.  She reports that onset was quite sudden and felt like the room was spinning.  It does come and go and is quite worse when standing up or moving head side-to-side.  It is associated with nausea.  She has no focal deficits on exam, but feels unsteady on gait. Plan to head CT, fluids and given meclizine, may proceed with MR if symptoms are persistent to rule out CVA.  She also complains of 2 loose stools nausea and vomiting.  The nausea and vomiting has improved after Zofran.  She has no focal area of abdominal tenderness.  Lungs clear to auscultation.  Hemodynamically stable and otherwise well-appearing Patient felt slightly better after fluids and meclizine still having dizziness on ambulation, will get MR to rule out CVA.  MR negative for acute infarct. Discussed chronic findings with patient.   Patient dose have history of Trigeminal neuralgia. No history consistent with chronic head ache. She will follow up with PCP for results.  I ordered medication including Meclizine, fluids  Reevaluation of the patient after these medicines showed that the patient improved I have reviewed the patients home medicines and have made adjustments as needed   Plan  D/c w/ close OP f/u, stable for d/c Given return precautions         Final Clinical Impression(s) / ED Diagnoses Final diagnoses:  Vertigo    Rx / DC Orders ED Discharge Orders          Ordered    meclizine (ANTIVERT) 25 MG tablet  3 times daily PRN        10/27/23 1218    ondansetron (ZOFRAN-ODT) 4 MG disintegrating tablet  Every 8 hours PRN        10/27/23 1218              Aurelius Gildersleeve, Kandace Organ, PA-C 10/27/23 1620    Wynetta Heckle, MD 10/29/23 1511

## 2023-10-27 NOTE — ED Triage Notes (Signed)
 Pt BIBA with c/o dizziness, n/v/d, dry heaving all since this morning. Cancer pt. Has not had any chemo treatment recently. Possible new onset of vertigo. Given Zofran 4mg  by EMS.    22G in left hand CBG 141- BP 150/84

## 2023-10-27 NOTE — Discharge Instructions (Addendum)
 Please follow up with PCP to make sure that symptoms are improving as expected. Take Meclziine as needed for dizziness and zofran as needed for nausea.  Return with new or worsening symptoms.

## 2023-10-28 ENCOUNTER — Inpatient Hospital Stay: Payer: 59

## 2023-10-28 ENCOUNTER — Inpatient Hospital Stay: Payer: 59 | Attending: Hematology and Oncology

## 2023-10-28 ENCOUNTER — Encounter

## 2023-10-28 ENCOUNTER — Other Ambulatory Visit: Payer: Self-pay | Admitting: *Deleted

## 2023-10-28 ENCOUNTER — Inpatient Hospital Stay (HOSPITAL_BASED_OUTPATIENT_CLINIC_OR_DEPARTMENT_OTHER): Payer: 59 | Admitting: Hematology and Oncology

## 2023-10-28 VITALS — BP 120/80 | HR 89 | Temp 98.2°F | Resp 16 | Wt 245.8 lb

## 2023-10-28 DIAGNOSIS — C9000 Multiple myeloma not having achieved remission: Secondary | ICD-10-CM

## 2023-10-28 DIAGNOSIS — D472 Monoclonal gammopathy: Secondary | ICD-10-CM

## 2023-10-28 DIAGNOSIS — Z79620 Long term (current) use of immunosuppressive biologic: Secondary | ICD-10-CM | POA: Insufficient documentation

## 2023-10-28 DIAGNOSIS — Z5112 Encounter for antineoplastic immunotherapy: Secondary | ICD-10-CM | POA: Insufficient documentation

## 2023-10-28 DIAGNOSIS — R42 Dizziness and giddiness: Secondary | ICD-10-CM | POA: Diagnosis not present

## 2023-10-28 DIAGNOSIS — Z95828 Presence of other vascular implants and grafts: Secondary | ICD-10-CM

## 2023-10-28 DIAGNOSIS — C9001 Multiple myeloma in remission: Secondary | ICD-10-CM

## 2023-10-28 LAB — CBC WITH DIFFERENTIAL (CANCER CENTER ONLY)
Abs Immature Granulocytes: 0.02 10*3/uL (ref 0.00–0.07)
Basophils Absolute: 0.1 10*3/uL (ref 0.0–0.1)
Basophils Relative: 2 %
Eosinophils Absolute: 0.3 10*3/uL (ref 0.0–0.5)
Eosinophils Relative: 7 %
HCT: 34.6 % — ABNORMAL LOW (ref 36.0–46.0)
Hemoglobin: 11.2 g/dL — ABNORMAL LOW (ref 12.0–15.0)
Immature Granulocytes: 1 %
Lymphocytes Relative: 28 %
Lymphs Abs: 1.2 10*3/uL (ref 0.7–4.0)
MCH: 29 pg (ref 26.0–34.0)
MCHC: 32.4 g/dL (ref 30.0–36.0)
MCV: 89.6 fL (ref 80.0–100.0)
Monocytes Absolute: 0.8 10*3/uL (ref 0.1–1.0)
Monocytes Relative: 19 %
Neutro Abs: 1.8 10*3/uL (ref 1.7–7.7)
Neutrophils Relative %: 43 %
Platelet Count: 178 10*3/uL (ref 150–400)
RBC: 3.86 MIL/uL — ABNORMAL LOW (ref 3.87–5.11)
RDW: 13 % (ref 11.5–15.5)
WBC Count: 4.1 10*3/uL (ref 4.0–10.5)
nRBC: 0 % (ref 0.0–0.2)

## 2023-10-28 LAB — CMP (CANCER CENTER ONLY)
ALT: 16 U/L (ref 0–44)
AST: 17 U/L (ref 15–41)
Albumin: 4.1 g/dL (ref 3.5–5.0)
Alkaline Phosphatase: 44 U/L (ref 38–126)
Anion gap: 3 — ABNORMAL LOW (ref 5–15)
BUN: 13 mg/dL (ref 6–20)
CO2: 28 mmol/L (ref 22–32)
Calcium: 9 mg/dL (ref 8.9–10.3)
Chloride: 107 mmol/L (ref 98–111)
Creatinine: 0.84 mg/dL (ref 0.44–1.00)
GFR, Estimated: >60 mL/min (ref >60)
Glucose, Bld: 88 mg/dL (ref 70–99)
Potassium: 3.7 mmol/L (ref 3.5–5.1)
Sodium: 138 mmol/L (ref 135–145)
Total Bilirubin: 0.5 mg/dL (ref 0.0–1.2)
Total Protein: 6.5 g/dL (ref 6.5–8.1)

## 2023-10-28 LAB — PREGNANCY, URINE: Preg Test, Ur: NEGATIVE

## 2023-10-28 MED ORDER — MECLIZINE HCL 25 MG PO TABS: 25.0000 mg | ORAL_TABLET | Freq: Three times a day (TID) | ORAL | 0 refills | Status: AC | PRN

## 2023-10-28 MED ORDER — LENALIDOMIDE 10 MG PO CAPS: 10.0000 mg | ORAL_CAPSULE | Freq: Every day | ORAL | 0 refills | Status: DC

## 2023-10-28 MED ORDER — SODIUM CHLORIDE 0.9% FLUSH
10.0000 mL | Freq: Once | INTRAVENOUS | Status: AC
  Administered 2023-10-28: 10 mL

## 2023-10-28 MED ORDER — DARATUMUMAB-HYALURONIDASE-FIHJ 1800-30000 MG-UT/15ML ~~LOC~~ SOLN
1800.0000 mg | Freq: Once | SUBCUTANEOUS | Status: AC
  Administered 2023-10-28: 1800 mg via SUBCUTANEOUS
  Filled 2023-10-28: qty 15

## 2023-10-28 MED ORDER — DIPHENHYDRAMINE HCL 25 MG PO CAPS
50.0000 mg | ORAL_CAPSULE | Freq: Once | ORAL | Status: AC
  Administered 2023-10-28: 50 mg via ORAL
  Filled 2023-10-28: qty 2

## 2023-10-28 MED ORDER — DEXAMETHASONE 4 MG PO TABS
20.0000 mg | ORAL_TABLET | Freq: Once | ORAL | Status: AC
Start: 2023-10-28 — End: 2023-10-28
  Administered 2023-10-28: 20 mg via ORAL
  Filled 2023-10-28: qty 5

## 2023-10-28 MED ORDER — ACETAMINOPHEN 325 MG PO TABS
650.0000 mg | ORAL_TABLET | Freq: Once | ORAL | Status: AC
  Administered 2023-10-28: 650 mg via ORAL
  Filled 2023-10-28: qty 2

## 2023-10-28 MED ORDER — ACYCLOVIR 400 MG PO TABS: 800.0000 mg | ORAL_TABLET | Freq: Two times a day (BID) | ORAL | 5 refills | Status: AC

## 2023-10-28 NOTE — Progress Notes (Signed)
 [facility] Cancer Follow up:    Viola Greulich, MD [address] [place] [address]   DIAGNOSIS: Multiple Myeloma  SUMMARY OF ONCOLOGIC HISTORY: Oncology History  Multiple myeloma (HCC)  09/12/2022 Initial Diagnosis   Multiple myeloma (HCC)   09/20/2022 - 10/04/2022 Chemotherapy   Patient is on Treatment Plan : MYELOMA NEWLY DIAGNOSED TRANSPLANT CANDIDATE DaraVRd (Daratumumab IV) q21d x 6 Cycles (Induction/Consolidation)     10/11/2022 - 10/22/2022 Chemotherapy   Patient is on Treatment Plan : MYELOMA NON-TRANSPLANT CANDIDATES VRd SQ q21d     11/04/2022 - 03/04/2023 Chemotherapy   Patient is on Treatment Plan : MYELOMA NEWLY DIAGNOSED TRANSPLANT CANDIDATE VRd q21d starting cycle 6 (Induction/Consolidation)     07/31/2023 - 07/31/2023 Chemotherapy   Patient is on Treatment Plan : MYELOMA Daratumumab IV q28d     08/05/2023 -  Chemotherapy   Patient is on Treatment Plan : MYELOMA NEWLY DIAGNOSED TRANSPLANT CANDIDATE Daratumumab SQ + Lenalidomide q28d (Maintenance)      ASBMT risk stratification: Low Risk, PR1 ECOG/KPS: 0/90% HSCT-CI score: 2 (Obesity, Age >40) Preparative regimen/treatment protocol: Melphalan 200 mg/m2   03/26/2023 - Bone Marrow Transplant  AUTOLOGOUS STEM CELL TRANSPLANT: Disease status at time of transplant: PR1 ASBMT risk stratification: low risk, ECOG/KPS: 1 / 90%, HSCT-CI score: 2 Mobilization was done with G-CSF and Mozobil with a yield of 2.104 x 10(7) CD34+ cells.  Preparative regimen with Melphalan 200 mg/m2 on 03/25/2023 (outpatient). Infusion of stem cells - 7.01 x 10(6) on 03/26/2023 (outpatient) Completed stem cell process 04/08/2023. Course minimally complicated by mucositis.   ENGRAFTMENT: WBC engraftment on 04/06/2023 and platelet engraftment on 04/11/2023 at [facility]. Last platelet transfusion given 04/04/2023.  04/04/2023 - Supportive Treatment  Adult OP Blood Administration PRN x 30 DAYS Plan Provider: Ollie Bhat, MD   CURRENT THERAPY: DaraVRD  INTERVAL HISTORY:  Kara Medina is a 48 year old female with multiple myeloma who presents for follow-up on her treatment regimen.    She had autologous transplant on 03/26/2023.  She started post transplant revelimid on 1.30.2024  Discussed the use of AI scribe software for clinical note transcription with the patient, who gave verbal consent to proceed.  History of Present Illness Kara Medina is a 48 year old female who presents with acute onset vertigo symptoms.  She experienced an acute episode of vertigo at approximately 2:45 AM. After getting up to use the bathroom, she felt as though 'everything was spinning' upon returning to bed and closing her eyes. She attempted to get up again but bumped into walls and eventually called out to her husband for help. She experienced nausea and dry heaving but did not vomit. Emergency services were called, and she was taken to the hospital where an MRI and CT scan were performed. The scans indicated chronic headaches, although she does not typically experience headaches. Her vitals were reported as normal, and she was diagnosed with vertigo.  No recent illness, viral infections, or gastrointestinal symptoms in the past two weeks. The vertigo episode was sudden and without any preceding symptoms. She has a family history of vertigo, as her mother-in-law and oldest daughter have experienced similar episodes.  Her current medications include meclizine, which she is instructed to take three times a day for five to seven days, Revlimid with two pills remaining, and Aciclovir at a dose of 800 mg twice daily. She also takes baby aspirin, Zofran as needed, Norvasc, Flexeril as needed, and ibuprofen as needed.  She is not currently taking gabapentin or Tums.  She reports skin discoloration and dryness, over the face. She is experiencing menopausal symptoms such as hot flashes, which she attributes to  chemotherapy received during a transplant. Her menstrual cycle has not returned since the transplant, and she experiences hot flashes primarily at night.  She recently received  post transplant vaccines last month and is scheduled to receive three more. She is also anticipating a root canal treatment, zometa to be held.  Rest of the pertinent 10 point ROS reviewed and negative  Patient Active Problem List   Diagnosis Date Noted   Family history of mixed hyperlipidemia 01/30/2023   Obstructive sleep apnea 01/30/2023   Port-A-Cath in place 01/06/2023   HTN (hypertension) 11/14/2022   Multiple myeloma (HCC) 09/12/2022   MGUS (monoclonal gammopathy of unknown significance) 09/10/2022   Hypertensive urgency 08/29/2022   Iron deficiency anemia 08/29/2022   LVH (left ventricular hypertrophy) 08/29/2022   Normocytic anemia 08/29/2022    is allergic to tape and amoxicillin.  MEDICAL HISTORY: Past Medical History:  Diagnosis Date   Anemia    Gestational diabetes    diet controlled   Postpartum care following vaginal delivery (7/27) 02/09/2016   Pregnancy induced hypertension    Trigeminal neuralgia    2012 corrected with surgery    SURGICAL HISTORY: Past Surgical History:  Procedure Laterality Date   APPENDECTOMY  2008   IR IMAGING GUIDED PORT INSERTION  12/03/2022   TRIGEMINAL NERVE DECOMPRESSION  2012    SOCIAL HISTORY: Social History   Socioeconomic History   Marital status: Married    Spouse name: Not on file   Number of children: Not on file   Years of education: Not on file   Highest education level: Bachelor's degree (e.g., BA, AB, BS)  Occupational History   Not on file  Tobacco Use   Smoking status: Never   Smokeless tobacco: Never  Vaping Use   Vaping status: Never Used  Substance and Sexual Activity   Alcohol use: No   Drug use: No   Sexual activity: Yes  Other Topics Concern   Not on file  Social History Narrative   Not on file   Social Drivers of  Health   Financial Resource Strain: Low Risk  (08/23/2022)   Overall Financial Resource Strain (CARDIA)    Difficulty of Paying Living Expenses: Not hard at all  Food Insecurity: No Food Insecurity (08/23/2022)   Hunger Vital Sign    Worried About Running Out of Food in the Last Year: Never true    Ran Out of Food in the Last Year: Never true  Transportation Needs: No Transportation Needs (08/23/2022)   PRAPARE - Administrator, Civil Service (Medical): No    Lack of Transportation (Non-Medical): No  Physical Activity: Insufficiently Active (08/23/2022)   Exercise Vital Sign    Days of Exercise per Week: 2 days    Minutes of Exercise per Session: 30 min  Stress: Stress Concern Present (08/23/2022)   [employer] of Occupational Health - Occupational Stress Questionnaire    Feeling of Stress : To some extent  Social Connections: Unknown (08/23/2022)   Social Connection and Isolation Panel [NHANES]    Frequency of Communication with Friends and Family: More than three times a week    Frequency of Social Gatherings with Friends and Family: Once a week    Attends Religious Services: Patient declined    Database administrator or Organizations: Yes  Attends Engineer, structural: More than 4 times per year    Marital Status: Married  Catering manager Violence: Not on file    FAMILY HISTORY: Family History  Problem Relation Age of Onset   Cancer Other    Hypertension Other    Healthy Father    Cancer Paternal Grandfather        stomach, lung   Hypertension Mother    Breast cancer Mother 83   Hypertension Maternal Grandmother    Arthritis Maternal Grandmother    Hypertension Maternal Grandfather    Other Brother        ?prediabetic   Diabetes Paternal Grandmother    Hypertension Paternal Grandmother    Autism Son     Review of Systems  Constitutional:  Positive for fatigue. Negative for appetite change, chills, fever and unexpected weight change.  HENT:    Negative for hearing loss, lump/mass and trouble swallowing.   Eyes:  Negative for eye problems and icterus.  Respiratory:  Negative for chest tightness, cough and shortness of breath.   Cardiovascular:  Negative for chest pain, leg swelling and palpitations.  Gastrointestinal:  Negative for abdominal distention, abdominal pain, constipation, diarrhea, nausea and vomiting.  Endocrine: Negative for hot flashes.  Genitourinary:  Negative for difficulty urinating.   Musculoskeletal:  Negative for arthralgias.  Skin:  Negative for itching and rash.  Neurological:  Negative for dizziness, extremity weakness, headaches and numbness.  Hematological:  Negative for adenopathy. Does not bruise/bleed easily.  Psychiatric/Behavioral:  Negative for depression. The patient is not nervous/anxious.       PHYSICAL EXAMINATION    Vitals:   10/28/23 1354  BP: 120/80  Pulse: 89  Resp: 16  Temp: 98.2 F (36.8 C)  SpO2: 99%    Physical Exam Constitutional:      General: She is not in acute distress.    Appearance: Normal appearance. She is not toxic-appearing.  HENT:     Head: Normocephalic and atraumatic.     Mouth/Throat:     Mouth: Mucous membranes are moist.     Pharynx: Oropharynx is clear. No oropharyngeal exudate or posterior oropharyngeal erythema.  Eyes:     General: No scleral icterus. Cardiovascular:     Rate and Rhythm: Normal rate and regular rhythm.     Pulses: Normal pulses.     Heart sounds: Normal heart sounds.  Pulmonary:     Effort: Pulmonary effort is normal.     Breath sounds: Normal breath sounds.  Abdominal:     General: Abdomen is flat. Bowel sounds are normal. There is no distension.     Palpations: Abdomen is soft.     Tenderness: There is no abdominal tenderness.  Musculoskeletal:        General: No swelling.     Cervical back: Neck supple.  Lymphadenopathy:     Cervical: No cervical adenopathy.  Skin:    General: Skin is warm and dry.     Findings: No  rash.  Neurological:     General: No focal deficit present.     Mental Status: She is alert.  Psychiatric:        Mood and Affect: Mood normal.        Behavior: Behavior normal.     LABORATORY DATA:  CBC    Component Value Date/Time   WBC 4.1 10/28/2023 1322   WBC 5.5 10/27/2023 0925   RBC 3.86 (L) 10/28/2023 1322   HGB 11.2 (L) 10/28/2023 1322   HCT 34.6 (  L) 10/28/2023 1322   HCT 24.4 (L) 09/04/2022 1037   PLT 178 10/28/2023 1322   MCV 89.6 10/28/2023 1322   MCH 29.0 10/28/2023 1322   MCHC 32.4 10/28/2023 1322   RDW 13.0 10/28/2023 1322   LYMPHSABS 1.2 10/28/2023 1322   MONOABS 0.8 10/28/2023 1322   EOSABS 0.3 10/28/2023 1322   BASOSABS 0.1 10/28/2023 1322    CMP     Component Value Date/Time   NA 138 10/28/2023 1322   K 3.7 10/28/2023 1322   CL 107 10/28/2023 1322   CO2 28 10/28/2023 1322   GLUCOSE 88 10/28/2023 1322   BUN 13 10/28/2023 1322   CREATININE 0.84 10/28/2023 1322   CREATININE 0.95 08/23/2022 1631   CALCIUM 9.0 10/28/2023 1322   PROT 6.5 10/28/2023 1322   ALBUMIN 4.1 10/28/2023 1322   AST 17 10/28/2023 1322   ALT 16 10/28/2023 1322   ALKPHOS 44 10/28/2023 1322   BILITOT 0.5 10/28/2023 1322   GFRNONAA >60 10/28/2023 1322   GFRAA >60 02/09/2016 0525    Last M protein at [facility], minute M spike to quantitate  ASSESSMENT and THERAPY PLAN:   Multiple myeloma (HCC)  Assessment and Plan Assessment & Plan Multiple Myeloma  Multiple Myeloma Ig G Kappa MM, status post daratumumab Revlimid and dexamethasone induction followed by autologous stem cell transplant in September 2024. Post-transplant, PR1 with M spike reduction to 0.23 grams. MRD positive at 0.1593%. Plan for maintenance with daratumumab and Revlimid for 2-3 yrs - Daratumumab subcu every 28 days, Revlimid 10 mg daily from day 1 to day 21 followed by 1 week off -Continue Acyclovir for a total of 1 year post-transplant. -Order monthly multiple myeloma panel labs. Last spep with notable M  protein  -Urine preg test neg.  Zometa and dental procedure Zometa infusion on hold due to planned root canal and potential dental complications. - Hold Zometa infusion until after root canal and healing.  Vertigo Likely benign paroxysmal positional vertigo (BPPV) due to episodic nature and positional triggers. MRI and CT ruled out other causes. Meclizine prescribed. Physical therapy recommended. - Prescribe meclizine 25 mg three times daily for 5-7 days, then as needed. - Refer to physical therapy for BPPV maneuvers.  Acyclovir dosing discrepancy Dosing instructions clarified and confirmed correct. - Update prescription to reflect 800 mg twice daily dosing.  Menopausal symptoms post-chemotherapy Experiencing menopausal symptoms likely due to chemotherapy-induced temporary menopause. Symptoms include dryness and melasma. Menstrual cycles may return within 1-2 years. - Monitor symptoms and consider non-hormonal treatments for hot flashes if needed.  General Health Maintenance Received four vaccines last month. Scheduled for three more. Routine gynecological exams recommended. - Follow up with gynecologist for routine exams. - Ensure completion of scheduled vaccinations.  Follow-up Coordination of ongoing treatment and monitoring of multiple conditions. - Schedule root canal and follow up on Zometa infusion post-procedure. - Ensure Revlimid coordination with pharmacy.      All questions were answered. The patient knows to call the clinic with any problems, questions or concerns. We can certainly see the patient much sooner if necessary.  Total encounter time:40 minutes*in face-to-face visit time, chart review, lab review, care coordination, order entry, and documentation of the encounter time.   *Total Encounter Time as defined by the Centers for Medicare and Medicaid Services includes, in addition to the face-to-face time of a patient visit (documented in the note above)  non-face-to-face time: obtaining and reviewing outside history, ordering and reviewing medications, tests or procedures, care coordination (communications with  other health care professionals or caregivers) and documentation in the medical record.

## 2023-10-28 NOTE — Assessment & Plan Note (Signed)
  Assessment and Plan Assessment & Plan Multiple Myeloma  Multiple Myeloma Ig G Kappa MM, status post daratumumab Revlimid and dexamethasone induction followed by autologous stem cell transplant in September 2024. Post-transplant, PR1 with M spike reduction to 0.23 grams. MRD positive at 0.1593%. Plan for maintenance with daratumumab and Revlimid for 2-3 yrs - Daratumumab subcu every 28 days, Revlimid 10 mg daily from day 1 to day 21 followed by 1 week off -Continue Acyclovir for a total of 1 year post-transplant. -Order monthly multiple myeloma panel labs. Last spep with notable M protein  -Urine preg test neg.  Zometa and dental procedure Zometa infusion on hold due to planned root canal and potential dental complications. - Hold Zometa infusion until after root canal and healing.  Vertigo Likely benign paroxysmal positional vertigo (BPPV) due to episodic nature and positional triggers. MRI and CT ruled out other causes. Meclizine prescribed. Physical therapy recommended. - Prescribe meclizine 25 mg three times daily for 5-7 days, then as needed. - Refer to physical therapy for BPPV maneuvers.  Acyclovir dosing discrepancy Dosing instructions clarified and confirmed correct. - Update prescription to reflect 800 mg twice daily dosing.  Menopausal symptoms post-chemotherapy Experiencing menopausal symptoms likely due to chemotherapy-induced temporary menopause. Symptoms include dryness and melasma. Menstrual cycles may return within 1-2 years. - Monitor symptoms and consider non-hormonal treatments for hot flashes if needed.  General Health Maintenance Received four vaccines last month. Scheduled for three more. Routine gynecological exams recommended. - Follow up with gynecologist for routine exams. - Ensure completion of scheduled vaccinations.  Follow-up Coordination of ongoing treatment and monitoring of multiple conditions. - Schedule root canal and follow up on Zometa  infusion post-procedure. - Ensure Revlimid coordination with pharmacy.

## 2023-10-29 LAB — BETA 2 MICROGLOBULIN, SERUM: Beta-2 Microglobulin: 1.2 mg/L (ref 0.6–2.4)

## 2023-10-29 LAB — KAPPA/LAMBDA LIGHT CHAINS
Kappa free light chain: 15.4 mg/L (ref 3.3–19.4)
Kappa, lambda light chain ratio: 2.26 — ABNORMAL HIGH (ref 0.26–1.65)
Lambda free light chains: 6.8 mg/L (ref 5.7–26.3)

## 2023-10-29 LAB — IGG, IGA, IGM
IgA: 147 mg/dL (ref 87–352)
IgG (Immunoglobin G), Serum: 779 mg/dL (ref 586–1602)
IgM (Immunoglobulin M), Srm: 37 mg/dL (ref 26–217)

## 2023-11-03 LAB — PROTEIN ELECTROPHORESIS, SERUM, WITH REFLEX
A/G Ratio: 1.3 (ref 0.7–1.7)
Albumin ELP: 3.5 g/dL (ref 2.9–4.4)
Alpha-1-Globulin: 0.2 g/dL (ref 0.0–0.4)
Alpha-2-Globulin: 0.7 g/dL (ref 0.4–1.0)
Beta Globulin: 1 g/dL (ref 0.7–1.3)
Gamma Globulin: 0.7 g/dL (ref 0.4–1.8)
Globulin, Total: 2.6 g/dL (ref 2.2–3.9)
M-Spike, %: 0.3 g/dL — ABNORMAL HIGH
SPEP Interpretation: 0
Total Protein ELP: 6.1 g/dL (ref 6.0–8.5)

## 2023-11-03 LAB — IMMUNOFIXATION REFLEX, SERUM
IgA: 159 mg/dL (ref 87–352)
IgG (Immunoglobin G), Serum: 782 mg/dL (ref 586–1602)
IgM (Immunoglobulin M), Srm: 36 mg/dL (ref 26–217)

## 2023-11-07 ENCOUNTER — Other Ambulatory Visit: Payer: Self-pay

## 2023-11-24 ENCOUNTER — Other Ambulatory Visit: Payer: Self-pay | Admitting: Hematology and Oncology

## 2023-11-24 ENCOUNTER — Other Ambulatory Visit: Payer: Self-pay | Admitting: *Deleted

## 2023-11-24 DIAGNOSIS — C9001 Multiple myeloma in remission: Secondary | ICD-10-CM

## 2023-11-24 DIAGNOSIS — D472 Monoclonal gammopathy: Secondary | ICD-10-CM

## 2023-11-24 DIAGNOSIS — C9000 Multiple myeloma not having achieved remission: Secondary | ICD-10-CM

## 2023-11-25 ENCOUNTER — Other Ambulatory Visit: Payer: Self-pay | Admitting: *Deleted

## 2023-11-25 ENCOUNTER — Inpatient Hospital Stay: Attending: Hematology and Oncology

## 2023-11-25 ENCOUNTER — Encounter: Payer: Self-pay | Admitting: Adult Health

## 2023-11-25 ENCOUNTER — Inpatient Hospital Stay (HOSPITAL_BASED_OUTPATIENT_CLINIC_OR_DEPARTMENT_OTHER): Admitting: Adult Health

## 2023-11-25 ENCOUNTER — Inpatient Hospital Stay

## 2023-11-25 VITALS — BP 128/80 | HR 88 | Temp 97.7°F | Resp 18 | Ht 66.0 in | Wt 251.5 lb

## 2023-11-25 DIAGNOSIS — C9000 Multiple myeloma not having achieved remission: Secondary | ICD-10-CM | POA: Insufficient documentation

## 2023-11-25 DIAGNOSIS — C9001 Multiple myeloma in remission: Secondary | ICD-10-CM

## 2023-11-25 DIAGNOSIS — Z5112 Encounter for antineoplastic immunotherapy: Secondary | ICD-10-CM | POA: Insufficient documentation

## 2023-11-25 DIAGNOSIS — D472 Monoclonal gammopathy: Secondary | ICD-10-CM

## 2023-11-25 DIAGNOSIS — Z79620 Long term (current) use of immunosuppressive biologic: Secondary | ICD-10-CM | POA: Insufficient documentation

## 2023-11-25 LAB — CMP (CANCER CENTER ONLY)
ALT: 18 U/L (ref 0–44)
AST: 19 U/L (ref 15–41)
Albumin: 4.1 g/dL (ref 3.5–5.0)
Alkaline Phosphatase: 46 U/L (ref 38–126)
Anion gap: 3 — ABNORMAL LOW (ref 5–15)
BUN: 16 mg/dL (ref 6–20)
CO2: 28 mmol/L (ref 22–32)
Calcium: 9 mg/dL (ref 8.9–10.3)
Chloride: 108 mmol/L (ref 98–111)
Creatinine: 0.79 mg/dL (ref 0.44–1.00)
GFR, Estimated: >60 mL/min (ref >60)
Glucose, Bld: 78 mg/dL (ref 70–99)
Potassium: 3.9 mmol/L (ref 3.5–5.1)
Sodium: 139 mmol/L (ref 135–145)
Total Bilirubin: 0.4 mg/dL (ref 0.0–1.2)
Total Protein: 6.4 g/dL — ABNORMAL LOW (ref 6.5–8.1)

## 2023-11-25 LAB — CBC WITH DIFFERENTIAL (CANCER CENTER ONLY)
Abs Immature Granulocytes: 0.02 10*3/uL (ref 0.00–0.07)
Basophils Absolute: 0.1 10*3/uL (ref 0.0–0.1)
Basophils Relative: 2 %
Eosinophils Absolute: 0.4 10*3/uL (ref 0.0–0.5)
Eosinophils Relative: 10 %
HCT: 33.6 % — ABNORMAL LOW (ref 36.0–46.0)
Hemoglobin: 11.1 g/dL — ABNORMAL LOW (ref 12.0–15.0)
Immature Granulocytes: 1 %
Lymphocytes Relative: 26 %
Lymphs Abs: 1.1 10*3/uL (ref 0.7–4.0)
MCH: 29.3 pg (ref 26.0–34.0)
MCHC: 33 g/dL (ref 30.0–36.0)
MCV: 88.7 fL (ref 80.0–100.0)
Monocytes Absolute: 0.8 10*3/uL (ref 0.1–1.0)
Monocytes Relative: 19 %
Neutro Abs: 1.8 10*3/uL (ref 1.7–7.7)
Neutrophils Relative %: 42 %
Platelet Count: 188 10*3/uL (ref 150–400)
RBC: 3.79 MIL/uL — ABNORMAL LOW (ref 3.87–5.11)
RDW: 13.2 % (ref 11.5–15.5)
WBC Count: 4.1 10*3/uL (ref 4.0–10.5)
nRBC: 0 % (ref 0.0–0.2)

## 2023-11-25 LAB — PREGNANCY, URINE: Preg Test, Ur: NEGATIVE

## 2023-11-25 MED ORDER — DARATUMUMAB-HYALURONIDASE-FIHJ 1800-30000 MG-UT/15ML ~~LOC~~ SOLN
1800.0000 mg | Freq: Once | SUBCUTANEOUS | Status: AC
  Administered 2023-11-25: 1800 mg via SUBCUTANEOUS
  Filled 2023-11-25: qty 15

## 2023-11-25 MED ORDER — ACETAMINOPHEN 325 MG PO TABS
650.0000 mg | ORAL_TABLET | Freq: Once | ORAL | Status: AC
  Administered 2023-11-25: 650 mg via ORAL
  Filled 2023-11-25: qty 2

## 2023-11-25 MED ORDER — DIPHENHYDRAMINE HCL 25 MG PO CAPS
25.0000 mg | ORAL_CAPSULE | Freq: Once | ORAL | Status: AC
  Administered 2023-11-25: 25 mg via ORAL
  Filled 2023-11-25: qty 1

## 2023-11-25 MED ORDER — SODIUM CHLORIDE 0.9% FLUSH
10.0000 mL | Freq: Once | INTRAVENOUS | Status: AC
  Administered 2023-11-25: 10 mL

## 2023-11-25 MED ORDER — DEXAMETHASONE 4 MG PO TABS
20.0000 mg | ORAL_TABLET | Freq: Once | ORAL | Status: AC
Start: 2023-11-25 — End: 2023-11-25
  Administered 2023-11-25: 20 mg via ORAL
  Filled 2023-11-25: qty 5

## 2023-11-25 NOTE — Assessment & Plan Note (Addendum)
 Kara Medina is a 48 year old woman with multiple myeloma diagnosed in February 2024 who began treatment with Dara VRD followed by autologous stem cell transplant in September 2024 and continues on maintenance daratumumab  and Revlimid .  Multiple myeloma:  -Myeloma labs every month pending today. - Urine pregnancy test is negative, Revlimid  refill request was sent.  She continues to tolerate well. - Patient will proceed with daratumumab  today and continue every 28 days. -Continue acyclovir  prophylaxis -Zometa  on hold and I changed the date of the order and did not sign it. - We will see her back in 4 weeks for labs, follow-up, and her next injection.

## 2023-11-25 NOTE — Progress Notes (Signed)
 [facility] Cancer Follow up:    Kara Greulich, MD [address] [place] [address]   DIAGNOSIS:  Cancer Staging  No matching staging information was found for the patient.    SUMMARY OF ONCOLOGIC HISTORY: Oncology History  Multiple myeloma (HCC)  09/12/2022 Initial Diagnosis   Multiple myeloma (HCC)   09/20/2022 - 10/04/2022 Chemotherapy   Patient is on Treatment Plan : MYELOMA NEWLY DIAGNOSED TRANSPLANT CANDIDATE DaraVRd (Daratumumab  IV) q21d x 6 Cycles (Induction/Consolidation)     10/11/2022 - 10/22/2022 Chemotherapy   Patient is on Treatment Plan : MYELOMA NON-TRANSPLANT CANDIDATES VRd SQ q21d     11/04/2022 - 03/04/2023 Chemotherapy   Patient is on Treatment Plan : MYELOMA NEWLY DIAGNOSED TRANSPLANT CANDIDATE VRd q21d starting cycle 6 (Induction/Consolidation)     07/31/2023 - 07/31/2023 Chemotherapy   Patient is on Treatment Plan : MYELOMA Daratumumab  IV q28d     08/05/2023 -  Chemotherapy   Patient is on Treatment Plan : MYELOMA NEWLY DIAGNOSED TRANSPLANT CANDIDATE Daratumumab  SQ + Lenalidomide  q28d (Maintenance)      ASBMT risk stratification: Low Risk, PR1 ECOG/KPS: 0/90% HSCT-CI score: 2 (Obesity, Age >40) Preparative regimen/treatment protocol: Melphalan 200 mg/m2   03/26/2023 - Bone Marrow Transplant  AUTOLOGOUS STEM CELL TRANSPLANT: Disease status at time of transplant: PR1 ASBMT risk stratification: low risk, ECOG/KPS: 1 / 90%, HSCT-CI score: 2 Mobilization was done with G-CSF and Mozobil with a yield of 2.104 x 10(7) CD34+ cells.  Preparative regimen with Melphalan 200 mg/m2 on 03/25/2023 (outpatient). Infusion of stem cells - 7.01 x 10(6) on 03/26/2023 (outpatient) Completed stem cell process 04/08/2023. Course minimally complicated by mucositis.   ENGRAFTMENT: WBC engraftment on 04/06/2023 and platelet engraftment on 04/11/2023 at [facility]. Last platelet transfusion given 04/04/2023.  04/04/2023 - Supportive Treatment  Adult OP  Blood Administration PRN x 30 DAYS Plan Provider: Ollie Bhat, MD   CURRENT THERAPY: Daratumumab /Revlimid  Maintenance  INTERVAL HISTORY:  Kara Medina 48 y.o. female returns for follow-up and evaluation per her to receiving daratumumab .  She continues on daratumumab  subcutaneously every 28 days and Revlimid  10 mg daily 3 weeks on 1 week off.  She is tolerating treatments well.  She is requesting refill on Revlimid .  Myeloma panel labs are pending today, urine pregnancy test from today was negative.  You are continuing to hold Zometa  due to her needing a root canal which is going to happen next month.   Patient Active Problem List   Diagnosis Date Noted   Family history of mixed hyperlipidemia 01/30/2023   Obstructive sleep apnea 01/30/2023   Port-A-Cath in place 01/06/2023   HTN (hypertension) 11/14/2022   Multiple myeloma (HCC) 09/12/2022   MGUS (monoclonal gammopathy of unknown significance) 09/10/2022   Hypertensive urgency 08/29/2022   Iron deficiency anemia 08/29/2022   LVH (left ventricular hypertrophy) 08/29/2022   Normocytic anemia 08/29/2022    is allergic to tape and amoxicillin.  MEDICAL HISTORY: Past Medical History:  Diagnosis Date   Anemia    Gestational diabetes    diet controlled   Postpartum care following vaginal delivery (7/27) 02/09/2016   Pregnancy induced hypertension    Trigeminal neuralgia    2012 corrected with surgery    SURGICAL HISTORY: Past Surgical History:  Procedure Laterality Date   APPENDECTOMY  2008   IR IMAGING GUIDED PORT INSERTION  12/03/2022   TRIGEMINAL NERVE DECOMPRESSION  2012    SOCIAL HISTORY: Social History   Socioeconomic History   Marital status: Married  Spouse name: Not on file   Number of children: Not on file   Years of education: Not on file   Highest education level: Bachelor's degree (e.g., BA, AB, BS)  Occupational History   Not on file  Tobacco Use   Smoking status: Never   Smokeless  tobacco: Never  Vaping Use   Vaping status: Never Used  Substance and Sexual Activity   Alcohol use: No   Drug use: No   Sexual activity: Yes  Other Topics Concern   Not on file  Social History Narrative   Not on file   Social Drivers of Health   Financial Resource Strain: Low Risk  (08/23/2022)   Overall Financial Resource Strain (CARDIA)    Difficulty of Paying Living Expenses: Not hard at all  Food Insecurity: No Food Insecurity (08/23/2022)   Hunger Vital Sign    Worried About Running Out of Food in the Last Year: Never true    Ran Out of Food in the Last Year: Never true  Transportation Needs: No Transportation Needs (08/23/2022)   PRAPARE - Administrator, Civil Service (Medical): No    Lack of Transportation (Non-Medical): No  Physical Activity: Insufficiently Active (08/23/2022)   Exercise Vital Sign    Days of Exercise per Week: 2 days    Minutes of Exercise per Session: 30 min  Stress: Stress Concern Present (08/23/2022)   [employer] of Occupational Health - Occupational Stress Questionnaire    Feeling of Stress : To some extent  Social Connections: Unknown (08/23/2022)   Social Connection and Isolation Panel [NHANES]    Frequency of Communication with Friends and Family: More than three times a week    Frequency of Social Gatherings with Friends and Family: Once a week    Attends Religious Services: Patient declined    Database administrator or Organizations: Yes    Attends Engineer, structural: More than 4 times per year    Marital Status: Married  Catering manager Violence: Not on file    FAMILY HISTORY: Family History  Problem Relation Age of Onset   Cancer Other    Hypertension Other    Healthy Father    Cancer Paternal Grandfather        stomach, lung   Hypertension Mother    Breast cancer Mother 41   Hypertension Maternal Grandmother    Arthritis Maternal Grandmother    Hypertension Maternal Grandfather    Other Brother         ?prediabetic   Diabetes Paternal Grandmother    Hypertension Paternal Grandmother    Autism Son     Review of Systems  Constitutional:  Negative for appetite change, chills, fatigue, fever and unexpected weight change.  HENT:   Negative for hearing loss, lump/mass and trouble swallowing.   Eyes:  Negative for eye problems and icterus.  Respiratory:  Negative for chest tightness, cough and shortness of breath.   Cardiovascular:  Negative for chest pain, leg swelling and palpitations.  Gastrointestinal:  Negative for abdominal distention, abdominal pain, constipation, diarrhea, nausea and vomiting.  Endocrine: Negative for hot flashes.  Genitourinary:  Negative for difficulty urinating.   Musculoskeletal:  Negative for arthralgias.  Skin:  Negative for itching and rash.  Neurological:  Negative for dizziness, extremity weakness, headaches and numbness.  Hematological:  Negative for adenopathy. Does not bruise/bleed easily.  Psychiatric/Behavioral:  Negative for depression. The patient is not nervous/anxious.       PHYSICAL EXAMINATION  Vitals:   11/25/23 1351  BP: 128/80  Pulse: 88  Resp: 18  Temp: 97.7 F (36.5 C)  SpO2: 100%    Physical Exam Constitutional:      General: She is not in acute distress.    Appearance: Normal appearance. She is not toxic-appearing.  HENT:     Head: Normocephalic and atraumatic.     Mouth/Throat:     Mouth: Mucous membranes are moist.     Pharynx: Oropharynx is clear. No oropharyngeal exudate or posterior oropharyngeal erythema.  Eyes:     General: No scleral icterus. Cardiovascular:     Rate and Rhythm: Normal rate and regular rhythm.     Pulses: Normal pulses.     Heart sounds: Normal heart sounds.  Pulmonary:     Effort: Pulmonary effort is normal.     Breath sounds: Normal breath sounds.  Abdominal:     General: Abdomen is flat. Bowel sounds are normal. There is no distension.     Palpations: Abdomen is soft.     Tenderness:  There is no abdominal tenderness.  Musculoskeletal:        General: No swelling.     Cervical back: Neck supple.  Lymphadenopathy:     Cervical: No cervical adenopathy.  Skin:    General: Skin is warm and dry.     Findings: No rash.  Neurological:     General: No focal deficit present.     Mental Status: She is alert.  Psychiatric:        Mood and Affect: Mood normal.        Behavior: Behavior normal.     LABORATORY DATA:  CBC    Component Value Date/Time   WBC 4.1 11/25/2023 1323   WBC 5.5 10/27/2023 0925   RBC 3.79 (L) 11/25/2023 1323   HGB 11.1 (L) 11/25/2023 1323   HCT 33.6 (L) 11/25/2023 1323   HCT 24.4 (L) 09/04/2022 1037   PLT 188 11/25/2023 1323   MCV 88.7 11/25/2023 1323   MCH 29.3 11/25/2023 1323   MCHC 33.0 11/25/2023 1323   RDW 13.2 11/25/2023 1323   LYMPHSABS 1.1 11/25/2023 1323   MONOABS 0.8 11/25/2023 1323   EOSABS 0.4 11/25/2023 1323   BASOSABS 0.1 11/25/2023 1323    CMP     Component Value Date/Time   NA 139 11/25/2023 1323   K 3.9 11/25/2023 1323   CL 108 11/25/2023 1323   CO2 28 11/25/2023 1323   GLUCOSE 78 11/25/2023 1323   BUN 16 11/25/2023 1323   CREATININE 0.79 11/25/2023 1323   CREATININE 0.95 08/23/2022 1631   CALCIUM 9.0 11/25/2023 1323   PROT 6.4 (L) 11/25/2023 1323   ALBUMIN 4.1 11/25/2023 1323   AST 19 11/25/2023 1323   ALT 18 11/25/2023 1323   ALKPHOS 46 11/25/2023 1323   BILITOT 0.4 11/25/2023 1323   GFRNONAA >60 11/25/2023 1323   GFRAA >60 02/09/2016 0525     ASSESSMENT and THERAPY PLAN:   Multiple myeloma (HCC) Kara Medina is a 48 year old woman with multiple myeloma diagnosed in February 2024 who began treatment with Dara VRD followed by autologous stem cell transplant in September 2024 and continues on maintenance daratumumab  and Revlimid .  Multiple myeloma:  -Myeloma labs every month pending today. - Urine pregnancy test is negative, Revlimid  refill request was sent.  She continues to tolerate well. - Patient will  proceed with daratumumab  today and continue every 28 days. -Continue acyclovir  prophylaxis -Zometa  on hold and I changed the date of the  order and did not sign it. - We will see her back in 4 weeks for labs, follow-up, and her next injection.   All questions were answered. The patient knows to call the clinic with any problems, questions or concerns. We can certainly see the patient much sooner if necessary.  Total encounter time:20 minutes*in face-to-face visit time, chart review, lab review, care coordination, order entry, and documentation of the encounter time.    Alwin Baars, NP 11/25/23 3:55 PM Medical Oncology and Hematology [facility] [address] [place], [place] [address] Tel. [phone]    Fax. [phone]  *Total Encounter Time as defined by the Centers for Medicare and Medicaid Services includes, in addition to the face-to-face time of a patient visit (documented in the note above) non-face-to-face time: obtaining and reviewing outside history, ordering and reviewing medications, tests or procedures, care coordination (communications with other health care professionals or caregivers) and documentation in the medical record.

## 2023-11-25 NOTE — Patient Instructions (Signed)
 [facility]  Discharge Instructions: Thank you for choosing [facility] to provide your oncology and hematology care.   If you have a lab appointment with the Cancer Center, please go directly to the Cancer Center and check in at the registration area.   Wear comfortable clothing and clothing appropriate for easy access to any Portacath or PICC line.   We strive to give you quality time with your provider. You may need to reschedule your appointment if you arrive late (15 or more minutes).  Arriving late affects you and other patients whose appointments are after yours.  Also, if you miss three or more appointments without notifying the office, you may be dismissed from the clinic at the provider's discretion.      For prescription refill requests, have your pharmacy contact our office and allow 72 hours for refills to be completed.    Today you received the following chemotherapy and/or immunotherapy agents: daratumumab-hyaluronidase-fihj      To help prevent nausea and vomiting after your treatment, we encourage you to take your nausea medication as directed.  BELOW ARE SYMPTOMS THAT SHOULD BE REPORTED IMMEDIATELY: *FEVER GREATER THAN 100.4 F (38 C) OR HIGHER *CHILLS OR SWEATING *NAUSEA AND VOMITING THAT IS NOT CONTROLLED WITH YOUR NAUSEA MEDICATION *UNUSUAL SHORTNESS OF BREATH *UNUSUAL BRUISING OR BLEEDING *URINARY PROBLEMS (pain or burning when urinating, or frequent urination) *BOWEL PROBLEMS (unusual diarrhea, constipation, pain near the anus) TENDERNESS IN MOUTH AND THROAT WITH OR WITHOUT PRESENCE OF ULCERS (sore throat, sores in mouth, or a toothache) UNUSUAL RASH, SWELLING OR PAIN  UNUSUAL VAGINAL DISCHARGE OR ITCHING   Items with * indicate a potential emergency and should be followed up as soon as possible or go to the Emergency Department if any problems should occur.  Please show the CHEMOTHERAPY ALERT  CARD or IMMUNOTHERAPY ALERT CARD at check-in to the Emergency Department and triage nurse.  Should you have questions after your visit or need to cancel or reschedule your appointment, please contact [facility] - A DEPT OF [facility]  Dept: [phone]  and follow the prompts.  Office hours are 8:00 a.m. to 4:30 p.m. Monday - Friday. Please note that voicemails left after 4:00 p.m. may not be returned until the following business day.  We are closed weekends and major holidays. You have access to a nurse at all times for urgent questions. Please call the main number to the clinic Dept: [phone] and follow the prompts.   For any non-urgent questions, you may also contact your provider using MyChart. We now offer e-Visits for anyone 37 and older to request care online for non-urgent symptoms. For details visit [email or web address].   Also download the MyChart app! Go to the app store, search "MyChart", open the app, select [place], and log in with your MyChart username and password.

## 2023-11-26 ENCOUNTER — Other Ambulatory Visit: Payer: Self-pay | Admitting: *Deleted

## 2023-11-26 ENCOUNTER — Encounter: Payer: Self-pay | Admitting: Hematology and Oncology

## 2023-11-26 DIAGNOSIS — D472 Monoclonal gammopathy: Secondary | ICD-10-CM

## 2023-11-26 DIAGNOSIS — C9000 Multiple myeloma not having achieved remission: Secondary | ICD-10-CM

## 2023-11-26 LAB — KAPPA/LAMBDA LIGHT CHAINS
Kappa free light chain: 17.2 mg/L (ref 3.3–19.4)
Kappa, lambda light chain ratio: 1.87 — ABNORMAL HIGH (ref 0.26–1.65)
Lambda free light chains: 9.2 mg/L (ref 5.7–26.3)

## 2023-11-26 LAB — IGG, IGA, IGM
IgA: 170 mg/dL (ref 87–352)
IgG (Immunoglobin G), Serum: 725 mg/dL (ref 586–1602)
IgM (Immunoglobulin M), Srm: 36 mg/dL (ref 26–217)

## 2023-11-26 LAB — BETA 2 MICROGLOBULIN, SERUM: Beta-2 Microglobulin: 1.4 mg/L (ref 0.6–2.4)

## 2023-11-26 MED ORDER — LENALIDOMIDE 10 MG PO CAPS: 10.0000 mg | ORAL_CAPSULE | Freq: Every day | ORAL | 0 refills | Status: DC

## 2023-11-27 ENCOUNTER — Encounter: Payer: Self-pay | Admitting: Hematology and Oncology

## 2023-11-27 ENCOUNTER — Other Ambulatory Visit: Payer: Self-pay

## 2023-12-03 LAB — IMMUNOFIXATION REFLEX, SERUM
IgA: 165 mg/dL (ref 87–352)
IgG (Immunoglobin G), Serum: 725 mg/dL (ref 586–1602)
IgM (Immunoglobulin M), Srm: 31 mg/dL (ref 26–217)

## 2023-12-03 LAB — PROTEIN ELECTROPHORESIS, SERUM, WITH REFLEX
A/G Ratio: 1.2 (ref 0.7–1.7)
Albumin ELP: 3.2 g/dL (ref 2.9–4.4)
Alpha-1-Globulin: 0.2 g/dL (ref 0.0–0.4)
Alpha-2-Globulin: 0.7 g/dL (ref 0.4–1.0)
Beta Globulin: 1 g/dL (ref 0.7–1.3)
Gamma Globulin: 0.7 g/dL (ref 0.4–1.8)
Globulin, Total: 2.6 g/dL (ref 2.2–3.9)
M-Spike, %: 0.3 g/dL — ABNORMAL HIGH
SPEP Interpretation: 0
Total Protein ELP: 5.8 g/dL — ABNORMAL LOW (ref 6.0–8.5)

## 2023-12-09 ENCOUNTER — Telehealth: Payer: Self-pay | Admitting: Internal Medicine

## 2023-12-09 MED ORDER — LABETALOL HCL 200 MG PO TABS: 200.0000 mg | ORAL_TABLET | Freq: Two times a day (BID) | ORAL | 0 refills | Status: DC

## 2023-12-09 NOTE — Telephone Encounter (Signed)
 Pt's medication was sent to pt's pharmacy as requested. Confirmation received.

## 2023-12-09 NOTE — Telephone Encounter (Signed)
*  STAT* If patient is at the pharmacy, call can be transferred to refill team.   1. Which medications need to be refilled? (please list name of each medication and dose if known)  labetalol  (NORMODYNE ) 200 MG tablet   2. Which pharmacy/location (including street and city if local pharmacy) is medication to be sent to? [id number] - [place], [place] - [address] AT [facility] [address]  3. Do they need a 30 day or 90 day supply?  90 day supply

## 2023-12-19 ENCOUNTER — Other Ambulatory Visit: Payer: Self-pay | Admitting: Hematology and Oncology

## 2023-12-19 DIAGNOSIS — D472 Monoclonal gammopathy: Secondary | ICD-10-CM

## 2023-12-19 DIAGNOSIS — C9000 Multiple myeloma not having achieved remission: Secondary | ICD-10-CM

## 2023-12-22 ENCOUNTER — Other Ambulatory Visit: Payer: Self-pay | Admitting: *Deleted

## 2023-12-22 ENCOUNTER — Telehealth: Payer: Self-pay

## 2023-12-22 DIAGNOSIS — C9000 Multiple myeloma not having achieved remission: Secondary | ICD-10-CM

## 2023-12-22 NOTE — Telephone Encounter (Signed)
 Verbally confirmed appt 6/10

## 2023-12-23 ENCOUNTER — Inpatient Hospital Stay: Attending: Hematology and Oncology

## 2023-12-23 ENCOUNTER — Inpatient Hospital Stay

## 2023-12-23 ENCOUNTER — Inpatient Hospital Stay (HOSPITAL_BASED_OUTPATIENT_CLINIC_OR_DEPARTMENT_OTHER): Admitting: Hematology and Oncology

## 2023-12-23 ENCOUNTER — Other Ambulatory Visit: Payer: Self-pay | Admitting: *Deleted

## 2023-12-23 VITALS — BP 123/80 | HR 86 | Temp 98.0°F | Resp 17 | Wt 252.7 lb

## 2023-12-23 DIAGNOSIS — Z95828 Presence of other vascular implants and grafts: Secondary | ICD-10-CM

## 2023-12-23 DIAGNOSIS — C9000 Multiple myeloma not having achieved remission: Secondary | ICD-10-CM | POA: Insufficient documentation

## 2023-12-23 DIAGNOSIS — D472 Monoclonal gammopathy: Secondary | ICD-10-CM

## 2023-12-23 DIAGNOSIS — Z79620 Long term (current) use of immunosuppressive biologic: Secondary | ICD-10-CM | POA: Insufficient documentation

## 2023-12-23 DIAGNOSIS — Z5112 Encounter for antineoplastic immunotherapy: Secondary | ICD-10-CM | POA: Insufficient documentation

## 2023-12-23 DIAGNOSIS — C9001 Multiple myeloma in remission: Secondary | ICD-10-CM

## 2023-12-23 LAB — CBC WITH DIFFERENTIAL (CANCER CENTER ONLY)
Abs Immature Granulocytes: 0.01 K/uL (ref 0.00–0.07)
Basophils Absolute: 0.1 K/uL (ref 0.0–0.1)
Basophils Relative: 2 %
Eosinophils Absolute: 0.6 K/uL — ABNORMAL HIGH (ref 0.0–0.5)
Eosinophils Relative: 12 %
HCT: 33.2 % — ABNORMAL LOW (ref 36.0–46.0)
Hemoglobin: 10.8 g/dL — ABNORMAL LOW (ref 12.0–15.0)
Immature Granulocytes: 0 %
Lymphocytes Relative: 22 %
Lymphs Abs: 1.1 K/uL (ref 0.7–4.0)
MCH: 29 pg (ref 26.0–34.0)
MCHC: 32.5 g/dL (ref 30.0–36.0)
MCV: 89.2 fL (ref 80.0–100.0)
Monocytes Absolute: 1 K/uL (ref 0.1–1.0)
Monocytes Relative: 20 %
Neutro Abs: 2.1 K/uL (ref 1.7–7.7)
Neutrophils Relative %: 44 %
Platelet Count: 187 K/uL (ref 150–400)
RBC: 3.72 MIL/uL — ABNORMAL LOW (ref 3.87–5.11)
RDW: 13.3 % (ref 11.5–15.5)
WBC Count: 4.8 K/uL (ref 4.0–10.5)
nRBC: 0 % (ref 0.0–0.2)

## 2023-12-23 LAB — CMP (CANCER CENTER ONLY)
ALT: 21 U/L (ref 0–44)
AST: 20 U/L (ref 15–41)
Albumin: 4.1 g/dL (ref 3.5–5.0)
Alkaline Phosphatase: 56 U/L (ref 38–126)
Anion gap: 4 — ABNORMAL LOW (ref 5–15)
BUN: 17 mg/dL (ref 6–20)
CO2: 29 mmol/L (ref 22–32)
Calcium: 9.2 mg/dL (ref 8.9–10.3)
Chloride: 105 mmol/L (ref 98–111)
Creatinine: 0.75 mg/dL (ref 0.44–1.00)
GFR, Estimated: >60 mL/min
Glucose, Bld: 90 mg/dL (ref 70–99)
Potassium: 4 mmol/L (ref 3.5–5.1)
Sodium: 138 mmol/L (ref 135–145)
Total Bilirubin: 0.4 mg/dL (ref 0.0–1.2)
Total Protein: 6.5 g/dL (ref 6.5–8.1)

## 2023-12-23 LAB — PREGNANCY, URINE: Preg Test, Ur: NEGATIVE

## 2023-12-23 MED ORDER — ACETAMINOPHEN 325 MG PO TABS
650.0000 mg | ORAL_TABLET | Freq: Once | ORAL | Status: AC
  Administered 2023-12-23: 650 mg via ORAL
  Filled 2023-12-23: qty 2

## 2023-12-23 MED ORDER — DARATUMUMAB-HYALURONIDASE-FIHJ 1800-30000 MG-UT/15ML ~~LOC~~ SOLN
1800.0000 mg | Freq: Once | SUBCUTANEOUS | Status: AC
  Administered 2023-12-23: 1800 mg via SUBCUTANEOUS
  Filled 2023-12-23: qty 15

## 2023-12-23 MED ORDER — DEXAMETHASONE 4 MG PO TABS
20.0000 mg | ORAL_TABLET | Freq: Once | ORAL | Status: AC
  Administered 2023-12-23: 20 mg via ORAL
  Filled 2023-12-23: qty 5

## 2023-12-23 MED ORDER — DIPHENHYDRAMINE HCL 25 MG PO CAPS
25.0000 mg | ORAL_CAPSULE | Freq: Once | ORAL | Status: AC
Start: 2023-12-23 — End: 2023-12-23
  Administered 2023-12-23: 25 mg via ORAL
  Filled 2023-12-23: qty 1

## 2023-12-23 MED ORDER — SODIUM CHLORIDE 0.9% FLUSH
10.0000 mL | Freq: Once | INTRAVENOUS | Status: AC
  Administered 2023-12-23: 10 mL

## 2023-12-23 NOTE — Progress Notes (Signed)
 [facility] Cancer Follow up:    Kara Greulich, MD [address] [place] [address]   DIAGNOSIS: Multiple Myeloma  SUMMARY OF ONCOLOGIC HISTORY: Oncology History  Multiple myeloma (HCC)  09/12/2022 Initial Diagnosis   Multiple myeloma (HCC)   09/20/2022 - 10/04/2022 Chemotherapy   Patient is on Treatment Plan : MYELOMA NEWLY DIAGNOSED TRANSPLANT CANDIDATE DaraVRd (Daratumumab  IV) q21d x 6 Cycles (Induction/Consolidation)     10/11/2022 - 10/22/2022 Chemotherapy   Patient is on Treatment Plan : MYELOMA NON-TRANSPLANT CANDIDATES VRd SQ q21d     11/04/2022 - 03/04/2023 Chemotherapy   Patient is on Treatment Plan : MYELOMA NEWLY DIAGNOSED TRANSPLANT CANDIDATE VRd q21d starting cycle 6 (Induction/Consolidation)     07/31/2023 - 07/31/2023 Chemotherapy   Patient is on Treatment Plan : MYELOMA Daratumumab  IV q28d     08/05/2023 -  Chemotherapy   Patient is on Treatment Plan : MYELOMA NEWLY DIAGNOSED TRANSPLANT CANDIDATE Daratumumab  SQ + Lenalidomide  q28d (Maintenance)      ASBMT risk stratification: Low Risk, PR1 ECOG/KPS: 0/90% HSCT-CI score: 2 (Obesity, Age >40) Preparative regimen/treatment protocol: Melphalan 200 mg/m2   03/26/2023 - Bone Marrow Transplant  AUTOLOGOUS STEM CELL TRANSPLANT: Disease status at time of transplant: PR1 ASBMT risk stratification: low risk, ECOG/KPS: 1 / 90%, HSCT-CI score: 2 Mobilization was done with G-CSF and Mozobil with a yield of 2.104 x 10(7) CD34+ cells.  Preparative regimen with Melphalan 200 mg/m2 on 03/25/2023 (outpatient). Infusion of stem cells - 7.01 x 10(6) on 03/26/2023 (outpatient) Completed stem cell process 04/08/2023. Course minimally complicated by mucositis.   ENGRAFTMENT: WBC engraftment on 04/06/2023 and platelet engraftment on 04/11/2023 at [facility]. Last platelet transfusion given 04/04/2023.  04/04/2023 - Supportive Treatment  Adult OP Blood Administration PRN x 30 DAYS Plan Provider: Ollie Bhat, MD   CURRENT THERAPY: DaraVRD  INTERVAL HISTORY:  Kara Medina is a 48 year old female with multiple myeloma who presents for follow-up on her treatment regimen.    She had autologous transplant on 03/26/2023.  She started post transplant revelimid on 1.30.2024  Discussed the use of AI scribe software for clinical note transcription with the patient, who gave verbal consent to proceed.  History of Present Illness Kara Medina is a 48 year old female with multiple myeloma who presents for routine follow-up.  She has not experienced any new symptoms since her last visit and describes her condition as 'same, same thing.' She is currently on Revlimid  and has completed her part of the testing process. She reports no issues with her current daratumumab  treatment and has reduced her Benadryl  dose in the pre-medication cocktail to one tablet without any problems.  She inquired about the timing of her Zometa  infusion in relation to her upcoming root canal, which has been postponed to the end of the month. She has not received Zometa  since February and is planning to have her root canal at the end of the month.  She is concerned about her weight gain and is exploring weight loss options, including medications. She reports weight gain and describes being able to 'fall asleep at the drop of a dime,' and wonders if this is related to not getting enough sleep at night due to her sleep apnea.   Rest of the pertinent 10 point ROS reviewed and negative  Patient Active Problem List   Diagnosis Date Noted   Family history of mixed hyperlipidemia 01/30/2023   Obstructive sleep apnea 01/30/2023   Port-A-Cath in  place 01/06/2023   HTN (hypertension) 11/14/2022   Multiple myeloma (HCC) 09/12/2022   MGUS (monoclonal gammopathy of unknown significance) 09/10/2022   Hypertensive urgency 08/29/2022   Iron deficiency anemia 08/29/2022   LVH (left ventricular hypertrophy) 08/29/2022    Normocytic anemia 08/29/2022    is allergic to tape and amoxicillin.  MEDICAL HISTORY: Past Medical History:  Diagnosis Date   Anemia    Gestational diabetes    diet controlled   Postpartum care following vaginal delivery (7/27) 02/09/2016   Pregnancy induced hypertension    Trigeminal neuralgia    2012 corrected with surgery    SURGICAL HISTORY: Past Surgical History:  Procedure Laterality Date   APPENDECTOMY  2008   IR IMAGING GUIDED PORT INSERTION  12/03/2022   TRIGEMINAL NERVE DECOMPRESSION  2012    SOCIAL HISTORY: Social History   Socioeconomic History   Marital status: Married    Spouse name: Not on file   Number of children: Not on file   Years of education: Not on file   Highest education level: Bachelor's degree (e.g., BA, AB, BS)  Occupational History   Not on file  Tobacco Use   Smoking status: Never   Smokeless tobacco: Never  Vaping Use   Vaping status: Never Used  Substance and Sexual Activity   Alcohol use: No   Drug use: No   Sexual activity: Yes  Other Topics Concern   Not on file  Social History Narrative   Not on file   Social Drivers of Health   Financial Resource Strain: Low Risk  (08/23/2022)   Overall Financial Resource Strain (CARDIA)    Difficulty of Paying Living Expenses: Not hard at all  Food Insecurity: No Food Insecurity (08/23/2022)   Hunger Vital Sign    Worried About Running Out of Food in the Last Year: Never true    Ran Out of Food in the Last Year: Never true  Transportation Needs: No Transportation Needs (08/23/2022)   PRAPARE - Administrator, Civil Service (Medical): No    Lack of Transportation (Non-Medical): No  Physical Activity: Insufficiently Active (08/23/2022)   Exercise Vital Sign    Days of Exercise per Week: 2 days    Minutes of Exercise per Session: 30 min  Stress: Stress Concern Present (08/23/2022)   [employer] of Occupational Health - Occupational Stress Questionnaire    Feeling of Stress  : To some extent  Social Connections: Unknown (08/23/2022)   Social Connection and Isolation Panel [NHANES]    Frequency of Communication with Friends and Family: More than three times a week    Frequency of Social Gatherings with Friends and Family: Once a week    Attends Religious Services: Patient declined    Database administrator or Organizations: Yes    Attends Engineer, structural: More than 4 times per year    Marital Status: Married  Catering manager Violence: Not on file    FAMILY HISTORY: Family History  Problem Relation Age of Onset   Cancer Other    Hypertension Other    Healthy Father    Cancer Paternal Grandfather        stomach, lung   Hypertension Mother    Breast cancer Mother 61   Hypertension Maternal Grandmother    Arthritis Maternal Grandmother    Hypertension Maternal Grandfather    Other Brother        ?prediabetic   Diabetes Paternal Grandmother    Hypertension Paternal Grandmother  Autism Son     PHYSICAL EXAMINATION    Vitals:   12/23/23 1329  BP: 123/80  Pulse: 86  Resp: 17  Temp: 98 F (36.7 C)  SpO2: 100%    Physical Exam Constitutional:      General: She is not in acute distress.    Appearance: Normal appearance. She is not toxic-appearing.  HENT:     Head: Normocephalic and atraumatic.     Mouth/Throat:     Mouth: Mucous membranes are moist.     Pharynx: Oropharynx is clear. No oropharyngeal exudate or posterior oropharyngeal erythema.  Eyes:     General: No scleral icterus. Cardiovascular:     Rate and Rhythm: Normal rate and regular rhythm.     Pulses: Normal pulses.     Heart sounds: Normal heart sounds.  Pulmonary:     Effort: Pulmonary effort is normal.     Breath sounds: Normal breath sounds.  Abdominal:     General: Abdomen is flat. Bowel sounds are normal. There is no distension.     Palpations: Abdomen is soft.     Tenderness: There is no abdominal tenderness.  Musculoskeletal:        General: No  swelling.     Cervical back: Neck supple.  Lymphadenopathy:     Cervical: No cervical adenopathy.  Skin:    General: Skin is warm and dry.     Findings: No rash.  Neurological:     General: No focal deficit present.     Mental Status: She is alert.  Psychiatric:        Mood and Affect: Mood normal.        Behavior: Behavior normal.     LABORATORY DATA:  CBC    Component Value Date/Time   WBC 4.8 12/23/2023 1309   WBC 5.5 10/27/2023 0925   RBC 3.72 (L) 12/23/2023 1309   HGB 10.8 (L) 12/23/2023 1309   HCT 33.2 (L) 12/23/2023 1309   HCT 24.4 (L) 09/04/2022 1037   PLT 187 12/23/2023 1309   MCV 89.2 12/23/2023 1309   MCH 29.0 12/23/2023 1309   MCHC 32.5 12/23/2023 1309   RDW 13.3 12/23/2023 1309   LYMPHSABS 1.1 12/23/2023 1309   MONOABS 1.0 12/23/2023 1309   EOSABS 0.6 (H) 12/23/2023 1309   BASOSABS 0.1 12/23/2023 1309    CMP     Component Value Date/Time   NA 139 11/25/2023 1323   K 3.9 11/25/2023 1323   CL 108 11/25/2023 1323   CO2 28 11/25/2023 1323   GLUCOSE 78 11/25/2023 1323   BUN 16 11/25/2023 1323   CREATININE 0.79 11/25/2023 1323   CREATININE 0.95 08/23/2022 1631   CALCIUM 9.0 11/25/2023 1323   PROT 6.4 (L) 11/25/2023 1323   ALBUMIN 4.1 11/25/2023 1323   AST 19 11/25/2023 1323   ALT 18 11/25/2023 1323   ALKPHOS 46 11/25/2023 1323   BILITOT 0.4 11/25/2023 1323   GFRNONAA >60 11/25/2023 1323   GFRAA >60 02/09/2016 0525    Last M protein at [facility], minute M spike to quantitate  ASSESSMENT and THERAPY PLAN:   No problem-specific Assessment & Plan notes found for this encounter.  Multiple Myeloma Ig G Kappa MM, status post daratumumab  Revlimid  and dexamethasone  induction followed by autologous stem cell transplant in September 2024. Post-transplant, PR1 with M spike reduction to 0.23 grams. MRD positive at 0.1593%. Plan for maintenance with daratumumab  and Revlimid  for 2-3 yrs - Daratumumab  subcu every 28 days, Revlimid  10 mg daily from day  1 to day 21  followed by 1 week off -Continue Acyclovir  for a total of 1 year post-transplant. -No overt concern on last myeloma labs.   Zometa  and dental procedure Zometa  infusion on hold due to planned root canal and potential dental complications. - Hold Zometa  infusion until after root canal and healing.  Continue acyclovir  800 mg PO BID   All questions were answered. The patient knows to call the clinic with any problems, questions or concerns. We can certainly see the patient much sooner if necessary.  Total encounter time:30 minutes*in face-to-face visit time, chart review, lab review, care coordination, order entry, and documentation of the encounter time.   *Total Encounter Time as defined by the Centers for Medicare and Medicaid Services includes, in addition to the face-to-face time of a patient visit (documented in the note above) non-face-to-face time: obtaining and reviewing outside history, ordering and reviewing medications, tests or procedures, care coordination (communications with other health care professionals or caregivers) and documentation in the medical record.

## 2023-12-23 NOTE — Patient Instructions (Signed)
 [facility]  Discharge Instructions: Thank you for choosing [facility] to provide your oncology and hematology care.   If you have a lab appointment with the Cancer Center, please go directly to the Cancer Center and check in at the registration area.   Wear comfortable clothing and clothing appropriate for easy access to any Portacath or PICC line.   We strive to give you quality time with your provider. You may need to reschedule your appointment if you arrive late (15 or more minutes).  Arriving late affects you and other patients whose appointments are after yours.  Also, if you miss three or more appointments without notifying the office, you may be dismissed from the clinic at the provider's discretion.      For prescription refill requests, have your pharmacy contact our office and allow 72 hours for refills to be completed.    Today you received the following chemotherapy and/or immunotherapy agents: daratumumab-hyaluronidase-fihj      To help prevent nausea and vomiting after your treatment, we encourage you to take your nausea medication as directed.  BELOW ARE SYMPTOMS THAT SHOULD BE REPORTED IMMEDIATELY: *FEVER GREATER THAN 100.4 F (38 C) OR HIGHER *CHILLS OR SWEATING *NAUSEA AND VOMITING THAT IS NOT CONTROLLED WITH YOUR NAUSEA MEDICATION *UNUSUAL SHORTNESS OF BREATH *UNUSUAL BRUISING OR BLEEDING *URINARY PROBLEMS (pain or burning when urinating, or frequent urination) *BOWEL PROBLEMS (unusual diarrhea, constipation, pain near the anus) TENDERNESS IN MOUTH AND THROAT WITH OR WITHOUT PRESENCE OF ULCERS (sore throat, sores in mouth, or a toothache) UNUSUAL RASH, SWELLING OR PAIN  UNUSUAL VAGINAL DISCHARGE OR ITCHING   Items with * indicate a potential emergency and should be followed up as soon as possible or go to the Emergency Department if any problems should occur.  Please show the CHEMOTHERAPY ALERT  CARD or IMMUNOTHERAPY ALERT CARD at check-in to the Emergency Department and triage nurse.  Should you have questions after your visit or need to cancel or reschedule your appointment, please contact [facility] - A DEPT OF [facility]  Dept: [phone]  and follow the prompts.  Office hours are 8:00 a.m. to 4:30 p.m. Monday - Friday. Please note that voicemails left after 4:00 p.m. may not be returned until the following business day.  We are closed weekends and major holidays. You have access to a nurse at all times for urgent questions. Please call the main number to the clinic Dept: [phone] and follow the prompts.   For any non-urgent questions, you may also contact your provider using MyChart. We now offer e-Visits for anyone 37 and older to request care online for non-urgent symptoms. For details visit [email or web address].   Also download the MyChart app! Go to the app store, search "MyChart", open the app, select [place], and log in with your MyChart username and password.

## 2023-12-24 ENCOUNTER — Other Ambulatory Visit: Payer: Self-pay | Admitting: *Deleted

## 2023-12-24 ENCOUNTER — Encounter: Payer: Self-pay | Admitting: Hematology and Oncology

## 2023-12-24 DIAGNOSIS — D472 Monoclonal gammopathy: Secondary | ICD-10-CM

## 2023-12-24 DIAGNOSIS — C9000 Multiple myeloma not having achieved remission: Secondary | ICD-10-CM

## 2023-12-24 LAB — KAPPA/LAMBDA LIGHT CHAINS
Kappa free light chain: 23.2 mg/L — ABNORMAL HIGH (ref 3.3–19.4)
Kappa, lambda light chain ratio: 1.97 — ABNORMAL HIGH (ref 0.26–1.65)
Lambda free light chains: 11.8 mg/L (ref 5.7–26.3)

## 2023-12-24 LAB — IGG, IGA, IGM
IgA: 193 mg/dL (ref 87–352)
IgG (Immunoglobin G), Serum: 775 mg/dL (ref 586–1602)
IgM (Immunoglobulin M), Srm: 30 mg/dL (ref 26–217)

## 2023-12-24 LAB — BETA 2 MICROGLOBULIN, SERUM: Beta-2 Microglobulin: 1.5 mg/L (ref 0.6–2.4)

## 2023-12-24 MED ORDER — LENALIDOMIDE 10 MG PO CAPS: 10.0000 mg | ORAL_CAPSULE | Freq: Every day | ORAL | 0 refills | Status: DC

## 2023-12-26 ENCOUNTER — Other Ambulatory Visit: Payer: Self-pay

## 2023-12-29 ENCOUNTER — Encounter: Payer: Self-pay | Admitting: Hematology and Oncology

## 2024-01-08 ENCOUNTER — Other Ambulatory Visit: Payer: Self-pay

## 2024-01-19 ENCOUNTER — Other Ambulatory Visit: Payer: Self-pay | Admitting: Hematology and Oncology

## 2024-01-19 ENCOUNTER — Encounter: Payer: Self-pay | Admitting: Hematology and Oncology

## 2024-01-19 DIAGNOSIS — D472 Monoclonal gammopathy: Secondary | ICD-10-CM

## 2024-01-19 DIAGNOSIS — C9000 Multiple myeloma not having achieved remission: Secondary | ICD-10-CM

## 2024-01-20 ENCOUNTER — Encounter: Payer: Self-pay | Admitting: Hematology and Oncology

## 2024-01-20 ENCOUNTER — Other Ambulatory Visit: Payer: Self-pay | Admitting: *Deleted

## 2024-01-20 ENCOUNTER — Ambulatory Visit: Admitting: Adult Health

## 2024-01-20 ENCOUNTER — Inpatient Hospital Stay

## 2024-01-20 ENCOUNTER — Inpatient Hospital Stay: Attending: Hematology and Oncology

## 2024-01-20 VITALS — BP 118/73 | HR 90 | Temp 99.5°F | Resp 16 | Wt 251.5 lb

## 2024-01-20 DIAGNOSIS — D472 Monoclonal gammopathy: Secondary | ICD-10-CM

## 2024-01-20 DIAGNOSIS — C9000 Multiple myeloma not having achieved remission: Secondary | ICD-10-CM

## 2024-01-20 DIAGNOSIS — C9001 Multiple myeloma in remission: Secondary | ICD-10-CM

## 2024-01-20 DIAGNOSIS — Z5112 Encounter for antineoplastic immunotherapy: Secondary | ICD-10-CM | POA: Insufficient documentation

## 2024-01-20 DIAGNOSIS — Z79620 Long term (current) use of immunosuppressive biologic: Secondary | ICD-10-CM | POA: Insufficient documentation

## 2024-01-20 LAB — CBC WITH DIFFERENTIAL (CANCER CENTER ONLY)
Abs Immature Granulocytes: 0.01 K/uL (ref 0.00–0.07)
Basophils Absolute: 0.1 K/uL (ref 0.0–0.1)
Basophils Relative: 2 %
Eosinophils Absolute: 0.9 K/uL — ABNORMAL HIGH (ref 0.0–0.5)
Eosinophils Relative: 19 %
HCT: 29.7 % — ABNORMAL LOW (ref 36.0–46.0)
Hemoglobin: 9.9 g/dL — ABNORMAL LOW (ref 12.0–15.0)
Immature Granulocytes: 0 %
Lymphocytes Relative: 20 %
Lymphs Abs: 1 K/uL (ref 0.7–4.0)
MCH: 29.6 pg (ref 26.0–34.0)
MCHC: 33.3 g/dL (ref 30.0–36.0)
MCV: 88.7 fL (ref 80.0–100.0)
Monocytes Absolute: 0.8 K/uL (ref 0.1–1.0)
Monocytes Relative: 17 %
Neutro Abs: 2 K/uL (ref 1.7–7.7)
Neutrophils Relative %: 42 %
Platelet Count: 254 K/uL (ref 150–400)
RBC: 3.35 MIL/uL — ABNORMAL LOW (ref 3.87–5.11)
RDW: 13.1 % (ref 11.5–15.5)
WBC Count: 4.8 K/uL (ref 4.0–10.5)
nRBC: 0 % (ref 0.0–0.2)

## 2024-01-20 LAB — CMP (CANCER CENTER ONLY)
ALT: 15 U/L (ref 0–44)
AST: 15 U/L (ref 15–41)
Albumin: 3.7 g/dL (ref 3.5–5.0)
Alkaline Phosphatase: 62 U/L (ref 38–126)
Anion gap: 5 (ref 5–15)
BUN: 13 mg/dL (ref 6–20)
CO2: 28 mmol/L (ref 22–32)
Calcium: 8.9 mg/dL (ref 8.9–10.3)
Chloride: 106 mmol/L (ref 98–111)
Creatinine: 0.82 mg/dL (ref 0.44–1.00)
GFR, Estimated: >60 mL/min (ref >60)
Glucose, Bld: 100 mg/dL — ABNORMAL HIGH (ref 70–99)
Potassium: 3.9 mmol/L (ref 3.5–5.1)
Sodium: 139 mmol/L (ref 135–145)
Total Bilirubin: 0.3 mg/dL (ref 0.0–1.2)
Total Protein: 6.3 g/dL — ABNORMAL LOW (ref 6.5–8.1)

## 2024-01-20 LAB — PREGNANCY, URINE: Preg Test, Ur: NEGATIVE

## 2024-01-20 MED ORDER — ACETAMINOPHEN 325 MG PO TABS
650.0000 mg | ORAL_TABLET | Freq: Once | ORAL | Status: AC
  Administered 2024-01-20: 650 mg via ORAL
  Filled 2024-01-20: qty 2

## 2024-01-20 MED ORDER — DARATUMUMAB-HYALURONIDASE-FIHJ 1800-30000 MG-UT/15ML ~~LOC~~ SOLN
1800.0000 mg | Freq: Once | SUBCUTANEOUS | Status: AC
  Administered 2024-01-20: 1800 mg via SUBCUTANEOUS
  Filled 2024-01-20: qty 15

## 2024-01-20 MED ORDER — DIPHENHYDRAMINE HCL 25 MG PO CAPS
25.0000 mg | ORAL_CAPSULE | Freq: Once | ORAL | Status: AC
  Administered 2024-01-20: 25 mg via ORAL
  Filled 2024-01-20: qty 1

## 2024-01-20 MED ORDER — DEXAMETHASONE 4 MG PO TABS
20.0000 mg | ORAL_TABLET | Freq: Once | ORAL | Status: AC
  Administered 2024-01-20: 20 mg via ORAL
  Filled 2024-01-20: qty 5

## 2024-01-20 NOTE — Patient Instructions (Signed)

## 2024-01-29 LAB — IFE, DARA-SPECIFIC, SERUM
IgA: 262 mg/dL (ref 87–352)
IgG (Immunoglobin G), Serum: 786 mg/dL (ref 586–1602)
IgM (Immunoglobulin M), Srm: 38 mg/dL (ref 26–217)

## 2024-02-01 ENCOUNTER — Ambulatory Visit: Payer: Self-pay | Admitting: Hematology and Oncology

## 2024-02-03 ENCOUNTER — Other Ambulatory Visit: Payer: Self-pay | Admitting: *Deleted

## 2024-02-05 ENCOUNTER — Ambulatory Visit: Attending: Cardiology | Admitting: Cardiology

## 2024-02-05 VITALS — BP 116/74 | HR 92 | Ht 66.0 in | Wt 251.9 lb

## 2024-02-05 DIAGNOSIS — I517 Cardiomegaly: Secondary | ICD-10-CM | POA: Diagnosis not present

## 2024-02-05 DIAGNOSIS — Z83438 Family history of other disorder of lipoprotein metabolism and other lipidemia: Secondary | ICD-10-CM

## 2024-02-05 DIAGNOSIS — I10 Essential (primary) hypertension: Secondary | ICD-10-CM

## 2024-02-05 MED ORDER — LABETALOL HCL 200 MG PO TABS: 200.0000 mg | ORAL_TABLET | Freq: Two times a day (BID) | ORAL | 3 refills | Status: AC

## 2024-02-05 MED ORDER — AMLODIPINE BESYLATE 5 MG PO TABS: 5.0000 mg | ORAL_TABLET | Freq: Every day | ORAL | 3 refills | Status: AC

## 2024-02-05 NOTE — Progress Notes (Signed)
 Cardiology Office Note:   Date:  02/05/2024  ID:  Kara Medina, DOB [birth year], MRN [id number] PCP: [name], MD  [facility] Providers Cardiologist:  Stanly DELENA Leavens, MD    History of Present Illness:    Discussed the use of AI scribe software for clinical note transcription with the patient, who gave verbal consent to proceed.  History of Present Illness Kara Medina is a 48 year old female with hypertension who presents for follow-up of blood pressure management. She was originally referred to the cardiology group in February 2024 for hypertension management.  She has a history of hypertension and was initially started on amlodipine  5 mg and metoprolol  25 mg twice daily. Her medication was later switched to labetalol  200 mg twice daily by our pharmacy team. She continues with this regimen, reports excellent BP control.  She experiences chronic shortness of breath (for at least the last year) with physical activity, particularly when attempting to walk. Currently, she can only manage about five minutes before becoming breathless. She attributes this primarily to her weight, which she is actively trying to manage by seeing [facility] clinic and aiming to resume walking regularly.  She has a history of multiple myeloma and underwent a transplant on March 26, 2023. She is currently on maintenance chemotherapy once a month at the cancer center. She feels 'so far, so good' post-transplant.  She has severe sleep apnea diagnosed a year ago following a sleep study. She initially received CPAP equipment but did not continue its use due to personal circumstances surrounding her cancer treatment. Insurance stopped covering and took device away. She acknowledges the importance of addressing her sleep apnea, especially in the context of weight management and cardiovascular health.  She has a family history of heart disease and has previously had a normal cardiac  ultrasound with a very mild degree of left ventricular hypertrophy noted. Her cholesterol levels were last checked and found to be appropriate.   Today patient denies chest pain, lower extremity edema, fatigue, palpitations, melena, hematuria, hemoptysis, diaphoresis, weakness, presyncope, syncope, orthopnea, and PND.   Studies Reviewed:    EKG:   EKG Interpretation Date/Time:  Thursday February 05 2024 10:40:50 EDT Ventricular Rate:  91 PR Interval:  160 QRS Duration:  80 QT Interval:  364 QTC Calculation: 447 R Axis:   25  Text Interpretation: Normal sinus rhythm Normal ECG When compared with ECG of 21-Oct-2022 15:52, PREVIOUS ECG IS PRESENT Confirmed by Trudy Birmingham [phone]) on 02/05/2024 10:49:09 AM    Cardiac Studies & Procedures   ______________________________________________________________________________________________     ECHOCARDIOGRAM  ECHOCARDIOGRAM COMPLETE 10/08/2022  Narrative ECHOCARDIOGRAM REPORT    Patient Name:   Kara Medina Date of Exam: 10/08/2022 Medical Rec #:  [id number]       Height:       66.0 in Accession #:    [id number]      Weight:       227.2 lb Date of Birth:  11-22-1975       BSA:          2.111 m Patient Age:    46 years        BP:           128/80 mmHg Patient Gender: F               HR:           84 bpm. Exam Location:  [address]  Procedure: 2D Echo, Cardiac Doppler,  Color Doppler, Strain Analysis and Intracardiac Opacification Agent  Indications:    Left Ventricular Hypertrophy I51  History:        Patient has no prior history of Echocardiogram examinations. Risk Factors:Hypertension.  Sonographer:    Augustin Seals RDCS Referring Phys: [id number] [facility] A CHANDRASEKHAR  IMPRESSIONS   1. Left ventricular ejection fraction, by estimation, is 60 to 65%. The left ventricle has normal function. The left ventricle has no regional wall motion abnormalities. There is mild left ventricular hypertrophy. Left ventricular diastolic  parameters were normal. GLS -19.5%. 2. Right ventricular systolic function is normal. The right ventricular size is normal. 3. The mitral valve is normal in structure. No evidence of mitral valve regurgitation. No evidence of mitral stenosis. 4. The aortic valve is normal in structure. Aortic valve regurgitation is not visualized. No aortic stenosis is present. 5. The inferior vena cava is normal in size with greater than 50% respiratory variability, suggesting right atrial pressure of 3 mmHg.  FINDINGS Left Ventricle: Left ventricular ejection fraction, by estimation, is 60 to 65%. The left ventricle has normal function. The left ventricle has no regional wall motion abnormalities. Definity  contrast agent was given IV to delineate the left ventricular endocardial borders. The left ventricular internal cavity size was normal in size. There is mild left ventricular hypertrophy. Left ventricular diastolic parameters were normal.  Right Ventricle: The right ventricular size is normal. No increase in right ventricular wall thickness. Right ventricular systolic function is normal.  Left Atrium: Left atrial size was normal in size.  Right Atrium: Right atrial size was normal in size.  Pericardium: There is no evidence of pericardial effusion.  Mitral Valve: The mitral valve is normal in structure. No evidence of mitral valve regurgitation. No evidence of mitral valve stenosis.  Tricuspid Valve: The tricuspid valve is normal in structure. Tricuspid valve regurgitation is not demonstrated. No evidence of tricuspid stenosis.  Aortic Valve: The aortic valve is normal in structure. Aortic valve regurgitation is not visualized. No aortic stenosis is present.  Pulmonic Valve: The pulmonic valve was normal in structure. Pulmonic valve regurgitation is not visualized. No evidence of pulmonic stenosis.  Aorta: The aortic root is normal in size and structure.  Venous: The inferior vena cava is normal in  size with greater than 50% respiratory variability, suggesting right atrial pressure of 3 mmHg.  IAS/Shunts: No atrial level shunt detected by color flow Doppler.   LEFT VENTRICLE PLAX 2D LVIDd:         4.90 cm   Diastology LVIDs:         2.60 cm   LV e' lateral: 6.85 cm/s LV PW:         1.30 cm LV IVS:        1.30 cm LVOT diam:     2.30 cm LV SV:         83 LV SV Index:   39 LVOT Area:     4.15 cm   RIGHT VENTRICLE RV Basal diam:  4.10 cm RV Mid diam:    3.30 cm RV S prime:     15.30 cm/s TAPSE (M-mode): 1.6 cm  LEFT ATRIUM             Index        RIGHT ATRIUM           Index LA diam:        4.80 cm 2.27 cm/m   RA Area:     14.00 cm LA Vol (  A2C):   54.4 ml 25.77 ml/m  RA Volume:   28.40 ml  13.45 ml/m LA Vol (A4C):   50.8 ml 24.06 ml/m LA Biplane Vol: 54.3 ml 25.72 ml/m AORTIC VALVE LVOT Vmax:   109.00 cm/s LVOT Vmean:  72.000 cm/s LVOT VTI:    0.199 m  AORTA Ao Root diam: 3.00 cm  TRICUSPID VALVE TR Peak grad:   18.1 mmHg TR Vmax:        213.00 cm/s  SHUNTS Systemic VTI:  0.20 m Systemic Diam: 2.30 cm  Aditya Sabharwal Electronically signed by Ria Commander Signature Date/Time: 10/09/2022/12:17:24 PM    Final          ______________________________________________________________________________________________       Risk Assessment/Calculations:              Physical Exam:   VS:  BP 116/74   Pulse 92   Ht 5' 6 (1.676 m)   Wt 251 lb 14.4 oz (114.3 kg)   SpO2 96%   BMI 40.66 kg/m    Wt Readings from Last 3 Encounters:  02/05/24 251 lb 14.4 oz (114.3 kg)  01/20/24 251 lb 8 oz (114.1 kg)  12/23/23 252 lb 11.2 oz (114.6 kg)     Physical Exam Vitals reviewed.  Constitutional:      Appearance: Normal appearance. She is obese.  HENT:     Head: Normocephalic.     Nose: Nose normal.  Eyes:     Pupils: Pupils are equal, round, and reactive to light.  Neck:     Vascular: No carotid bruit.  Cardiovascular:     Rate and  Rhythm: Normal rate and regular rhythm.     Pulses: Normal pulses.     Heart sounds: Normal heart sounds. No murmur heard.    No friction rub. No gallop.  Pulmonary:     Effort: Pulmonary effort is normal.     Breath sounds: Normal breath sounds.  Abdominal:     General: Abdomen is flat.  Musculoskeletal:     Right lower leg: No edema.     Left lower leg: No edema.  Skin:    General: Skin is warm and dry.     Capillary Refill: Capillary refill takes less than 2 seconds.  Neurological:     General: No focal deficit present.     Mental Status: She is alert and oriented to person, place, and time.  Psychiatric:        Mood and Affect: Mood normal.        Behavior: Behavior normal.        Thought Content: Thought content normal.        Judgment: Judgment normal.    ASSESSMENT AND PLAN:    Assessment & Plan Hypertension Hypertension is well-controlled with excellent blood pressure on current medication regimen. No significant changes in symptoms since last year. - Continue amlodipine  5 mg and labetalol  200 mg twice daily. - Refill prescriptions for amlodipine  and labetalol  at [facility] on [address] in 90-day supplies.  Mild left ventricular hypertrophy Mild left ventricular hypertrophy likely secondary to hypertension with no significant changes in symptoms since last year. Blood pressure control is the primary treatment. - Continue current antihypertensive regimen to manage blood pressure and prevent progression of left ventricular hypertrophy.  Severe obstructive sleep apnea Severe obstructive sleep apnea diagnosed a year ago. CPAP equipment was obtained but not used consistently due to personal circumstances. Untreated sleep apnea can hinder weight loss and cause long-term cardiovascular issues, including pulmonary  hypertension. Importance of treating sleep apnea to aid in weight loss and prevent cardiovascular complications was discussed. - Encouraged patient to contact [facility] to discuss re-establishing CPAP therapy. - Consider repeating a sleep study if required by insurance for CPAP re-initiation.  Obesity Obesity contributing to shortness of breath and fatigue. Weight has remained stable since last year. Engaged in weight management efforts at [employer]. Shortness of breath likely related to weight rather than cardiac issues, as previous echocardiogram was normal. Weight loss could improve blood pressure and overall health. - Continue weight management efforts at [facility]. - Encourage use of CPAP to aid in weight loss and improve sleep quality.  Multiple myeloma Multiple myeloma with a recent transplant on September 11th. Currently on maintenance chemotherapy once a month at the cancer center. Reports feeling well post-transplant with only a small trace of disease remaining. - Continue monthly maintenance chemotherapy at the cancer center.  General Health Maintenance No recent cholesterol check. Previous LDL was appropriate for no known coronary artery disease. Importance of monitoring cholesterol levels was discussed. - Order fasting lipid panel at [facility] at her convenience.  Follow-up No immediate need for cardiology follow-up unless symptoms change. Current management can be overseen by primary care if desired, but she prefers to maintain cardiology follow-up. - Schedule follow-up with cardiology in one year unless symptoms change.         Signed, Artist Pouch, PA-C

## 2024-02-05 NOTE — Patient Instructions (Signed)
 Medication Instructions:  Your physician recommends that you continue on your current medications as directed. Please refer to the Current Medication list given to you today.  *If you need a refill on your cardiac medications before your next appointment, please call your pharmacy*  Lab Work: TO BE DONE WHEN FASTING: LIPIDS  If you have labs (blood work) drawn today and your tests are completely normal, you will receive your results only by: MyChart Message (if you have MyChart) OR A paper copy in the mail If you have any lab test that is abnormal or we need to change your treatment, we will call you to review the results.  Testing/Procedures: NONE  Follow-Up: At [facility], you and your health needs are our priority.  As part of our continuing mission to provide you with exceptional heart care, our providers are all part of one team.  This team includes your primary Cardiologist (physician) and Advanced Practice Providers or APPs (Physician Assistants and Nurse Practitioners) who all work together to provide you with the care you need, when you need it.  Your next appointment:   1 year(s)  Provider:   Stanly DELENA Leavens, MD    We recommend signing up for the patient portal called MyChart.  Sign up information is provided on this After Visit Summary.  MyChart is used to connect with patients for Virtual Visits (Telemedicine).  Patients are able to view lab/test results, encounter notes, upcoming appointments, etc.  Non-urgent messages can be sent to your provider as well.   To learn more about what you can do with MyChart, go to [email or web address].

## 2024-02-09 ENCOUNTER — Other Ambulatory Visit: Payer: Self-pay | Admitting: *Deleted

## 2024-02-11 ENCOUNTER — Other Ambulatory Visit: Payer: Self-pay | Admitting: *Deleted

## 2024-02-11 ENCOUNTER — Other Ambulatory Visit: Payer: Self-pay | Admitting: Hematology and Oncology

## 2024-02-11 DIAGNOSIS — C9000 Multiple myeloma not having achieved remission: Secondary | ICD-10-CM

## 2024-02-11 DIAGNOSIS — D472 Monoclonal gammopathy: Secondary | ICD-10-CM

## 2024-02-11 NOTE — Telephone Encounter (Signed)
 This RN contacted [facility] per refill sent by MD for lenalidomide  without celegene auth number- cancelled current refill request.  This RN verified with pt where she is in current dosing cycle with pt having 1 week left of medications then 7 days off.  Pt is scheduled for lab on 8/5 - upon we can obtain required urine pregnancy testing for getting auth and refilling medication.

## 2024-02-14 ENCOUNTER — Other Ambulatory Visit: Payer: Self-pay

## 2024-02-14 ENCOUNTER — Encounter (HOSPITAL_COMMUNITY): Payer: Self-pay | Admitting: Emergency Medicine

## 2024-02-14 ENCOUNTER — Emergency Department (HOSPITAL_COMMUNITY)

## 2024-02-14 ENCOUNTER — Emergency Department (HOSPITAL_COMMUNITY)
Admission: EM | Admit: 2024-02-14 | Discharge: 2024-02-15 | Disposition: A | Discharge status: Home / Self Care | Attending: Emergency Medicine | Admitting: Emergency Medicine

## 2024-02-14 DIAGNOSIS — R509 Fever, unspecified: Secondary | ICD-10-CM | POA: Diagnosis present

## 2024-02-14 DIAGNOSIS — Z7982 Long term (current) use of aspirin: Secondary | ICD-10-CM | POA: Diagnosis not present

## 2024-02-14 DIAGNOSIS — J181 Lobar pneumonia, unspecified organism: Secondary | ICD-10-CM | POA: Diagnosis not present

## 2024-02-14 DIAGNOSIS — J189 Pneumonia, unspecified organism: Secondary | ICD-10-CM

## 2024-02-14 HISTORY — DX: Multiple myeloma not having achieved remission: C90.00

## 2024-02-14 LAB — CBC WITH DIFFERENTIAL/PLATELET
Abs Immature Granulocytes: 0.04 K/uL (ref 0.00–0.07)
Basophils Absolute: 0.1 K/uL (ref 0.0–0.1)
Basophils Relative: 1 %
Eosinophils Absolute: 0 K/uL (ref 0.0–0.5)
Eosinophils Relative: 0 %
HCT: 36.2 % (ref 36.0–46.0)
Hemoglobin: 11.4 g/dL — ABNORMAL LOW (ref 12.0–15.0)
Immature Granulocytes: 1 %
Lymphocytes Relative: 12 %
Lymphs Abs: 0.9 K/uL (ref 0.7–4.0)
MCH: 28.7 pg (ref 26.0–34.0)
MCHC: 31.5 g/dL (ref 30.0–36.0)
MCV: 91.2 fL (ref 80.0–100.0)
Monocytes Absolute: 1.8 K/uL — ABNORMAL HIGH (ref 0.1–1.0)
Monocytes Relative: 23 %
Neutro Abs: 5.2 K/uL (ref 1.7–7.7)
Neutrophils Relative %: 63 %
Platelets: 204 K/uL (ref 150–400)
RBC: 3.97 MIL/uL (ref 3.87–5.11)
RDW: 14.3 % (ref 11.5–15.5)
WBC: 8 K/uL (ref 4.0–10.5)
nRBC: 0 % (ref 0.0–0.2)

## 2024-02-14 LAB — URINALYSIS, W/ REFLEX TO CULTURE (INFECTION SUSPECTED)
Bilirubin Urine: NEGATIVE
Glucose, UA: NEGATIVE mg/dL
Ketones, ur: NEGATIVE mg/dL
Leukocytes,Ua: NEGATIVE
Nitrite: NEGATIVE
Protein, ur: 100 mg/dL — AB
Specific Gravity, Urine: 1.019 (ref 1.005–1.030)
pH: 6 (ref 5.0–8.0)

## 2024-02-14 LAB — COMPREHENSIVE METABOLIC PANEL WITH GFR
ALT: 22 U/L (ref 0–44)
AST: 24 U/L (ref 15–41)
Albumin: 3.6 g/dL (ref 3.5–5.0)
Alkaline Phosphatase: 63 U/L (ref 38–126)
Anion gap: 12 (ref 5–15)
BUN: 14 mg/dL (ref 6–20)
CO2: 21 mmol/L — ABNORMAL LOW (ref 22–32)
Calcium: 8.9 mg/dL (ref 8.9–10.3)
Chloride: 104 mmol/L (ref 98–111)
Creatinine, Ser: 0.86 mg/dL (ref 0.44–1.00)
GFR, Estimated: >60 mL/min (ref >60)
Glucose, Bld: 153 mg/dL — ABNORMAL HIGH (ref 70–99)
Potassium: 3.6 mmol/L (ref 3.5–5.1)
Sodium: 137 mmol/L (ref 135–145)
Total Bilirubin: 0.9 mg/dL (ref 0.0–1.2)
Total Protein: 7.1 g/dL (ref 6.5–8.1)

## 2024-02-14 LAB — HCG, SERUM, QUALITATIVE: Preg, Serum: NEGATIVE

## 2024-02-14 LAB — PROTIME-INR
INR: 1.1 (ref 0.8–1.2)
Prothrombin Time: 14.7 s (ref 11.4–15.2)

## 2024-02-14 LAB — I-STAT CG4 LACTIC ACID, ED: Lactic Acid, Venous: 1.8 mmol/L (ref 0.5–1.9)

## 2024-02-14 MED ORDER — AZITHROMYCIN 250 MG PO TABS
250.0000 mg | ORAL_TABLET | Freq: Every day | ORAL | 0 refills | Status: AC
Start: 2024-02-14 — End: 2024-02-18

## 2024-02-14 MED ORDER — CEFADROXIL 500 MG PO CAPS: 500.0000 mg | ORAL_CAPSULE | Freq: Two times a day (BID) | ORAL | 0 refills | Status: AC

## 2024-02-14 MED ORDER — KETOROLAC TROMETHAMINE 30 MG/ML IJ SOLN: 30.0000 mg | Freq: Once | INTRAMUSCULAR | Status: DC

## 2024-02-14 MED ORDER — SODIUM CHLORIDE 0.9 % IV SOLN
1.0000 g | Freq: Once | INTRAVENOUS | Status: AC
  Administered 2024-02-14: 1 g via INTRAVENOUS
  Filled 2024-02-14: qty 10

## 2024-02-14 MED ORDER — SODIUM CHLORIDE 0.9 % IV SOLN
500.0000 mg | Freq: Once | INTRAVENOUS | Status: AC
  Administered 2024-02-14: 500 mg via INTRAVENOUS
  Filled 2024-02-14: qty 5

## 2024-02-14 NOTE — ED Provider Notes (Signed)
 [facility]GENC[facility] DEPARTMENT AT [facility] Provider Note   CSN: [id number] Arrival date & time: 02/14/24  8042     Patient presents with: Code Sepsis   Kara Medina is a 48 y.o. female.   HPI Patient with multiple myeloma, currently receiving therapy, last 1 month ago presents with fever, chills, fatigue.  No focal pain, no vomiting, no shortness of breath, no headache. Symptoms have been present for about 2 days, the patient was seen in urgent care today, sent here for evaluation.  Flu, COVID-negative per urgent care.    Prior to Admission medications   Medication Sig Start Date End Date Taking? Authorizing Provider  azithromycin  (ZITHROMAX ) 250 MG tablet Take 1 tablet (250 mg total) by mouth daily for 4 days. Take 1 every day until finished. 02/14/24 02/18/24 Yes Garrick Charleston, MD  cefadroxil  (DURICEF) 500 MG capsule Take 1 capsule (500 mg total) by mouth 2 (two) times daily. 02/14/24  Yes Garrick Charleston, MD  acyclovir  (ZOVIRAX ) 400 MG tablet Take 2 tablets (800 mg total) by mouth 2 (two) times daily. 10/28/23   Iruku, Praveena, MD  amLODipine  (NORVASC ) 5 MG tablet Take 1 tablet (5 mg total) by mouth daily. 02/05/24   Trudy Birmingham, PA-C  aspirin EC 81 MG tablet Take 81 mg by mouth daily. Swallow whole.    [provider]  Blood Pressure Monitoring (BLOOD PRESSURE CUFF) MISC 1 Device by Does not apply route daily. 08/23/22   [name], MD  calcium carbonate (TUMS EX) 750 MG chewable tablet Chew 2 tablets by mouth 2 (two) times daily.    [provider]  cyclobenzaprine  (FLEXERIL ) 5 MG tablet Take 1 tablet (5 mg total) by mouth 3 (three) times daily as needed for muscle spasms. 09/27/22   Iruku, Praveena, MD  dexamethasone  (DECADRON ) 4 MG tablet Take by mouth. 12/01/22   [provider]  folic acid  (FOLVITE ) 1 MG tablet Take 1 mg by mouth daily. 02/02/24   [provider]  folic acid -vitamin b complex-vitamin c-selenium-zinc  (DIALYVITE) 3 MG TABS tablet Take 1 tablet by mouth daily.    [provider]  ibuprofen  (ADVIL ) 200 MG tablet Take 400 mg by mouth every 6 (six) hours as needed for headache or moderate pain.    [provider]  labetalol  (NORMODYNE ) 200 MG tablet Take 1 tablet (200 mg total) by mouth 2 (two) times daily. 02/05/24   Trudy Birmingham, PA-C  lenalidomide  (REVLIMID ) 10 MG capsule TAKE 1 CAPSULE BY MOUTH 1 TIME A DAY 02/11/24   Iruku, Praveena, MD  lidocaine -prilocaine  (EMLA ) cream Apply topically once. 12/08/22   [provider]  lidocaine -prilocaine  (EMLA ) cream Apply to affected area once 07/31/23   Iruku, Praveena, MD  meclizine  (ANTIVERT ) 25 MG tablet Take 1 tablet (25 mg total) by mouth 3 (three) times daily as needed for dizziness. 10/28/23   Iruku, Praveena, MD  Multiple Vitamin (MULTI-VITAMIN) tablet Take 1 tablet by mouth daily. 04/06/23   [provider]  ondansetron  (ZOFRAN ) 8 MG tablet  09/16/22   [provider]  ondansetron  (ZOFRAN -ODT) 4 MG disintegrating tablet Take 1 tablet (4 mg total) by mouth every 8 (eight) hours as needed for nausea or vomiting. 10/27/23   Barrett, Jamie N, PA-C  prochlorperazine  (COMPAZINE ) 10 MG tablet Take 1 tablet (10 mg total) by mouth every 6 (six) hours as needed for nausea or vomiting. 07/31/23   Iruku, Praveena, MD  traMADol  (ULTRAM ) 50 MG tablet Take 1 tablet (50 mg  total) by mouth every 12 (twelve) hours as needed. 09/27/22   Iruku, Praveena, MD    Allergies: Tape and Amoxicillin    Review of Systems  Updated Vital Signs BP 121/80   Pulse 97   Temp (!) 101.4 F (38.6 C) (Oral)   Resp 16   Ht 5' 6 (1.676 m)   Wt 113.9 kg   SpO2 95%   BMI 40.51 kg/m   Physical Exam Vitals and nursing note reviewed.  Constitutional:      General: She is not in acute distress.    Appearance: She is well-developed.  HENT:     Head: Normocephalic and atraumatic.  Eyes:     Conjunctiva/sclera: Conjunctivae normal.   Cardiovascular:     Rate and Rhythm: Regular rhythm. Tachycardia present.  Pulmonary:     Effort: Pulmonary effort is normal. No respiratory distress.     Breath sounds: Normal breath sounds. No stridor.  Abdominal:     General: There is no distension.  Skin:    General: Skin is warm and dry.  Neurological:     Mental Status: She is alert and oriented to person, place, and time.     Cranial Nerves: No cranial nerve deficit.  Psychiatric:        Mood and Affect: Mood normal.     (all labs ordered are listed, but only abnormal results are displayed) Labs Reviewed  COMPREHENSIVE METABOLIC PANEL WITH GFR - Abnormal; Notable for the following components:      Result Value   CO2 21 (*)    Glucose, Bld 153 (*)    All other components within normal limits  CBC WITH DIFFERENTIAL/PLATELET - Abnormal; Notable for the following components:   Hemoglobin 11.4 (*)    Monocytes Absolute 1.8 (*)    All other components within normal limits  URINALYSIS, W/ REFLEX TO CULTURE (INFECTION SUSPECTED) - Abnormal; Notable for the following components:   Color, Urine AMBER (*)    APPearance HAZY (*)    Hgb urine dipstick MODERATE (*)    Protein, ur 100 (*)    Bacteria, UA RARE (*)    All other components within normal limits  CULTURE, BLOOD (ROUTINE X 2)  CULTURE, BLOOD (ROUTINE X 2)  PROTIME-INR  HCG, SERUM, QUALITATIVE  I-STAT CG4 LACTIC ACID, ED  I-STAT CG4 LACTIC ACID, ED    EKG: None  Radiology: DG Chest 2 View Result Date: 02/14/2024 CLINICAL DATA:  Fever, fatigue EXAM: CHEST - 2 VIEW COMPARISON:  09/18/2022 FINDINGS: Frontal and lateral views of the chest demonstrate right chest wall port tip in the superior vena cava. Cardiac silhouette is unremarkable. There is a rounded area of consolidation within the left lower lobe, best seen on the frontal view, compatible with pneumonia given clinical presentation. No effusion or pneumothorax. No acute displaced fracture. IMPRESSION: 1. Rounded  area of consolidation within the left lower lobe, favor pneumonia given clinical presentation. Followup PA and lateral chest X-ray is recommended in 3-4 weeks following trial of antibiotic therapy to ensure resolution and exclude underlying malignancy. Electronically Signed   By: Ozell Daring M.D.   On: 02/14/2024 22:40     Procedures   Medications Ordered in the ED  ketorolac  (TORADOL ) 30 MG/ML injection 30 mg (30 mg Intravenous Not Given 02/14/24 2158)  azithromycin  (ZITHROMAX ) 500 mg in sodium chloride  0.9 % 250 mL IVPB (500 mg Intravenous New Bag/Given 02/14/24 2304)  cefTRIAXone  (ROCEPHIN ) 1 g in sodium chloride  0.9 % 100 mL IVPB (1 g  Intravenous New Bag/Given 02/14/24 2305)                                    Medical Decision Making Patient with multiple myeloma presents with fever.  Patient is awake, alert, providing history, is in no distress.  Patient with borderline tachycardia, given fever, she meets SIRS criteria, but is no obvious source for infection.  COVID, flu, both negative, per chart review at urgent care earlier today.  Patient has no respiratory complaints, the pneumonia and other sources are considered, urinalysis, x-ray, labs sent. Cardiac 95/105 sinus abnormal Pulse ox 100% room air normal  Amount and/or Complexity of Data Reviewed Independent Historian: spouse External Data Reviewed: notes.    Details: Urgent care notes reviewed Labs: ordered. Decision-making details documented in ED Course. Radiology: ordered and independent interpretation performed. Decision-making details documented in ED Course.  Risk Prescription drug management. Decision regarding hospitalization. Diagnosis or treatment significantly limited by social determinants of health.   11:06 PM On repeat exam patient awake, alert, sitting upright no hypoxia, no tachypnea, fever has resolved.  Labs reassuring, no lactic acidosis, leukocytosis, no evidence for neutropenia.  Patient does have x-ray  evidence for left lower lobe pneumonia which redemonstrated the patient and her husband at bedside.  Without evidence for bacteremia, sepsis without evidence for decompensated state patient appropriate for ongoing therapy as an outpatient to follow-up with her oncology and primary care teams.  Final diagnoses:  Community acquired pneumonia of left lower lobe of lung    ED Discharge Orders          Ordered    cefadroxil  (DURICEF) 500 MG capsule  2 times daily        02/14/24 2306    azithromycin  (ZITHROMAX ) 250 MG tablet  Daily        02/14/24 2306               Garrick Charleston, MD 02/14/24 2306

## 2024-02-14 NOTE — ED Triage Notes (Signed)
 Pt reports fatigue, and high fever since yesterday, pt reports she is a cancer pt, went to UC where she had a covid and flu, both negative

## 2024-02-16 ENCOUNTER — Telehealth: Payer: Self-pay

## 2024-02-16 ENCOUNTER — Encounter: Payer: Self-pay | Admitting: Hematology and Oncology

## 2024-02-16 NOTE — Telephone Encounter (Signed)
 Pt verbally confirmed appt for 8/5

## 2024-02-17 ENCOUNTER — Inpatient Hospital Stay: Attending: Hematology and Oncology

## 2024-02-17 ENCOUNTER — Other Ambulatory Visit: Payer: Self-pay | Admitting: *Deleted

## 2024-02-17 ENCOUNTER — Other Ambulatory Visit: Payer: Self-pay

## 2024-02-17 ENCOUNTER — Inpatient Hospital Stay

## 2024-02-17 ENCOUNTER — Inpatient Hospital Stay (HOSPITAL_BASED_OUTPATIENT_CLINIC_OR_DEPARTMENT_OTHER): Admitting: Hematology and Oncology

## 2024-02-17 ENCOUNTER — Encounter: Payer: Self-pay | Admitting: Hematology and Oncology

## 2024-02-17 VITALS — BP 124/68 | HR 103 | Temp 98.2°F | Resp 18 | Wt 247.2 lb

## 2024-02-17 VITALS — BP 114/69 | HR 88 | Temp 98.8°F | Resp 16

## 2024-02-17 DIAGNOSIS — C9001 Multiple myeloma in remission: Secondary | ICD-10-CM

## 2024-02-17 DIAGNOSIS — D472 Monoclonal gammopathy: Secondary | ICD-10-CM

## 2024-02-17 DIAGNOSIS — C9000 Multiple myeloma not having achieved remission: Secondary | ICD-10-CM

## 2024-02-17 DIAGNOSIS — Z5112 Encounter for antineoplastic immunotherapy: Secondary | ICD-10-CM | POA: Diagnosis present

## 2024-02-17 DIAGNOSIS — Z79620 Long term (current) use of immunosuppressive biologic: Secondary | ICD-10-CM | POA: Diagnosis not present

## 2024-02-17 DIAGNOSIS — D649 Anemia, unspecified: Secondary | ICD-10-CM

## 2024-02-17 LAB — CBC WITH DIFFERENTIAL (CANCER CENTER ONLY)
Abs Immature Granulocytes: 0.03 K/uL (ref 0.00–0.07)
Basophils Absolute: 0.1 K/uL (ref 0.0–0.1)
Basophils Relative: 1 %
Eosinophils Absolute: 0.4 K/uL (ref 0.0–0.5)
Eosinophils Relative: 7 %
HCT: 27.8 % — ABNORMAL LOW (ref 36.0–46.0)
Hemoglobin: 9.2 g/dL — ABNORMAL LOW (ref 12.0–15.0)
Immature Granulocytes: 1 %
Lymphocytes Relative: 20 %
Lymphs Abs: 1 K/uL (ref 0.7–4.0)
MCH: 28.8 pg (ref 26.0–34.0)
MCHC: 33.1 g/dL (ref 30.0–36.0)
MCV: 86.9 fL (ref 80.0–100.0)
Monocytes Absolute: 1 K/uL (ref 0.1–1.0)
Monocytes Relative: 20 %
Neutro Abs: 2.7 K/uL (ref 1.7–7.7)
Neutrophils Relative %: 51 %
Platelet Count: 205 K/uL (ref 150–400)
RBC: 3.2 MIL/uL — ABNORMAL LOW (ref 3.87–5.11)
RDW: 14 % (ref 11.5–15.5)
WBC Count: 5.2 K/uL (ref 4.0–10.5)
nRBC: 0 % (ref 0.0–0.2)

## 2024-02-17 LAB — CMP (CANCER CENTER ONLY)
ALT: 24 U/L (ref 0–44)
AST: 22 U/L (ref 15–41)
Albumin: 3.7 g/dL (ref 3.5–5.0)
Alkaline Phosphatase: 54 U/L (ref 38–126)
Anion gap: 6 (ref 5–15)
BUN: 18 mg/dL (ref 6–20)
CO2: 28 mmol/L (ref 22–32)
Calcium: 8.5 mg/dL — ABNORMAL LOW (ref 8.9–10.3)
Chloride: 103 mmol/L (ref 98–111)
Creatinine: 0.72 mg/dL (ref 0.44–1.00)
GFR, Estimated: >60 mL/min (ref >60)
Glucose, Bld: 102 mg/dL — ABNORMAL HIGH (ref 70–99)
Potassium: 3.4 mmol/L — ABNORMAL LOW (ref 3.5–5.1)
Sodium: 137 mmol/L (ref 135–145)
Total Bilirubin: 0.5 mg/dL (ref 0.0–1.2)
Total Protein: 6.3 g/dL — ABNORMAL LOW (ref 6.5–8.1)

## 2024-02-17 LAB — PREGNANCY, URINE: Preg Test, Ur: NEGATIVE

## 2024-02-17 MED ORDER — ACETAMINOPHEN 325 MG PO TABS
650.0000 mg | ORAL_TABLET | Freq: Once | ORAL | Status: AC
  Administered 2024-02-17: 650 mg via ORAL
  Filled 2024-02-17: qty 2

## 2024-02-17 MED ORDER — DEXAMETHASONE 4 MG PO TABS
20.0000 mg | ORAL_TABLET | Freq: Once | ORAL | Status: AC
  Administered 2024-02-17: 20 mg via ORAL
  Filled 2024-02-17: qty 5

## 2024-02-17 MED ORDER — DIPHENHYDRAMINE HCL 25 MG PO CAPS
25.0000 mg | ORAL_CAPSULE | Freq: Once | ORAL | Status: AC
  Administered 2024-02-17: 25 mg via ORAL
  Filled 2024-02-17: qty 1

## 2024-02-17 MED ORDER — SODIUM CHLORIDE FLUSH 0.9 % IV SOLN: 10.0000 mL | INTRAVENOUS | Status: DC | PRN

## 2024-02-17 MED ORDER — DARATUMUMAB-HYALURONIDASE-FIHJ 1800-30000 MG-UT/15ML ~~LOC~~ SOLN
1800.0000 mg | Freq: Once | SUBCUTANEOUS | Status: AC
  Administered 2024-02-17: 1800 mg via SUBCUTANEOUS
  Filled 2024-02-17: qty 15

## 2024-02-17 MED ORDER — LENALIDOMIDE 10 MG PO CAPS: 10.0000 mg | ORAL_CAPSULE | Freq: Every day | ORAL | 0 refills | Status: DC

## 2024-02-17 MED ORDER — SODIUM CHLORIDE 0.9% FLUSH: 10.0000 mL | Freq: Once | INTRAVENOUS | Status: DC

## 2024-02-17 NOTE — Patient Instructions (Signed)
 [facility] - A DEPT OF [facility]  Discharge Instructions: Thank you for choosing [facility] to provide your oncology and hematology care.   If you have a lab appointment with the Cancer Center, please go directly to the Cancer Center and check in at the registration area.   Wear comfortable clothing and clothing appropriate for easy access to any Portacath or PICC line.   We strive to give you quality time with your provider. You may need to reschedule your appointment if you arrive late (15 or more minutes).  Arriving late affects you and other patients whose appointments are after yours.  Also, if you miss three or more appointments without notifying the office, you may be dismissed from the clinic at the provider's discretion.      For prescription refill requests, have your pharmacy contact our office and allow 72 hours for refills to be completed.    Today you received the following chemotherapy and/or immunotherapy agent: Daratumumab (Darzalex Faspro)      To help prevent nausea and vomiting after your treatment, we encourage you to take your nausea medication as directed.  BELOW ARE SYMPTOMS THAT SHOULD BE REPORTED IMMEDIATELY: *FEVER GREATER THAN 100.4 F (38 C) OR HIGHER *CHILLS OR SWEATING *NAUSEA AND VOMITING THAT IS NOT CONTROLLED WITH YOUR NAUSEA MEDICATION *UNUSUAL SHORTNESS OF BREATH *UNUSUAL BRUISING OR BLEEDING *URINARY PROBLEMS (pain or burning when urinating, or frequent urination) *BOWEL PROBLEMS (unusual diarrhea, constipation, pain near the anus) TENDERNESS IN MOUTH AND THROAT WITH OR WITHOUT PRESENCE OF ULCERS (sore throat, sores in mouth, or a toothache) UNUSUAL RASH, SWELLING OR PAIN  UNUSUAL VAGINAL DISCHARGE OR ITCHING   Items with * indicate a potential emergency and should be followed up as soon as possible or go to the Emergency Department if any problems should occur.  Please show the CHEMOTHERAPY ALERT  CARD or IMMUNOTHERAPY ALERT CARD at check-in to the Emergency Department and triage nurse.  Should you have questions after your visit or need to cancel or reschedule your appointment, please contact [facility] - A DEPT OF [facility]  Dept: [phone]  and follow the prompts.  Office hours are 8:00 a.m. to 4:30 p.m. Monday - Friday. Please note that voicemails left after 4:00 p.m. may not be returned until the following business day.  We are closed weekends and major holidays. You have access to a nurse at all times for urgent questions. Please call the main number to the clinic Dept: [phone] and follow the prompts.   For any non-urgent questions, you may also contact your provider using MyChart. We now offer e-Visits for anyone 39 and older to request care online for non-urgent symptoms. For details visit [email or web address].   Also download the MyChart app! Go to the app store, search "MyChart", open the app, select [facility], and log in with your MyChart username and password.  Daratumumab; Hyaluronidase Injection What is this medication? DARATUMUMAB; HYALURONIDASE (dar a toom ue mab; hye al ur ON i dase) treats multiple myeloma, a type of bone marrow cancer. Daratumumab works by blocking a protein that causes cancer cells to grow and multiply. This helps to slow or stop the spread of cancer cells. Hyaluronidase works by increasing the absorption of other medications in the body to help them work better. This medication may also be used treat amyloidosis, a condition that causes the buildup of a protein (amyloid) in  your body. It works by reducing the buildup of this protein, which decreases symptoms. It is a combination medication that contains a monoclonal antibody. This medicine may be used for other purposes; ask your health care provider or pharmacist if you have questions. COMMON BRAND NAME(S): DARZALEX FASPRO What should I tell my care team  before I take this medication? They need to know if you have any of these conditions: Heart disease Infection, such as chickenpox, cold sores, herpes, hepatitis B Lung or breathing disease An unusual or allergic reaction to daratumumab, hyaluronidase, other medications, foods, dyes, or preservatives Pregnant or trying to get pregnant Breast-feeding How should I use this medication? This medication is injected under the skin. It is given by your care team in a hospital or clinic setting. Talk to your care team about the use of this medication in children. Special care may be needed. Overdosage: If you think you have taken too much of this medicine contact a poison control center or emergency room at once. NOTE: This medicine is only for you. Do not share this medicine with others. What if I miss a dose? Keep appointments for follow-up doses. It is important not to miss your dose. Call your care team if you are unable to keep an appointment. What may interact with this medication? Interactions have not been studied. This list may not describe all possible interactions. Give your health care provider a list of all the medicines, herbs, non-prescription drugs, or dietary supplements you use. Also tell them if you smoke, drink alcohol, or use illegal drugs. Some items may interact with your medicine. What should I watch for while using this medication? Your condition will be monitored carefully while you are receiving this medication. This medication can cause serious allergic reactions. To reduce your risk, your care team may give you other medication to take before receiving this one. Be sure to follow the directions from your care team. This medication can affect the results of blood tests to match your blood type. These changes can last for up to 6 months after the final dose. Your care team will do blood tests to match your blood type before you start treatment. Tell all of your care team that  you are being treated with this medication before receiving a blood transfusion. This medication can affect the results of some tests used to determine treatment response; extra tests may be needed to evaluate response. Talk to your care team if you wish to become pregnant or think you are pregnant. This medication can cause serious birth defects if taken during pregnancy and for 3 months after the last dose. A reliable form of contraception is recommended while taking this medication and for 3 months after the last dose. Talk to your care team about effective forms of contraception. Do not breast-feed while taking this medication. What side effects may I notice from receiving this medication? Side effects that you should report to your care team as soon as possible: Allergic reactions--skin rash, itching, hives, swelling of the face, lips, tongue, or throat Heart rhythm changes--fast or irregular heartbeat, dizziness, feeling faint or lightheaded, chest pain, trouble breathing Infection--fever, chills, cough, sore throat, wounds that don't heal, pain or trouble when passing urine, general feeling of discomfort or being unwell Infusion reactions--chest pain, shortness of breath or trouble breathing, feeling faint or lightheaded Sudden eye pain or change in vision such as blurry vision, seeing halos around lights, vision loss Unusual bruising or bleeding Side effects that usually  do not require medical attention (report to your care team if they continue or are bothersome): Constipation Diarrhea Fatigue Nausea Pain, tingling, or numbness in the hands or feet Swelling of the ankles, hands, or feet This list may not describe all possible side effects. Call your doctor for medical advice about side effects. You may report side effects to FDA at 1-800-FDA-1088. Where should I keep my medication? This medication is given in a hospital or clinic. It will not be stored at home. NOTE: This sheet is a  summary. It may not cover all possible information. If you have questions about this medicine, talk to your doctor, pharmacist, or health care provider.  2024 Elsevier/Gold Standard (2021-11-06 00:00:00)

## 2024-02-17 NOTE — Addendum Note (Signed)
 Addended by: MARLEE SETTER B on: 02/17/2024 02:00 PM   Modules accepted: Orders

## 2024-02-17 NOTE — Progress Notes (Signed)
 [facility] Cancer Follow up:    Kara Clotilda SAUNDERS, MD [address] [place] [address]   DIAGNOSIS: Multiple Myeloma  SUMMARY OF ONCOLOGIC HISTORY: Oncology History  Multiple myeloma (HCC)  09/12/2022 Initial Diagnosis   Multiple myeloma (HCC)   09/20/2022 - 10/04/2022 Chemotherapy   Patient is on Treatment Plan : MYELOMA NEWLY DIAGNOSED TRANSPLANT CANDIDATE DaraVRd (Daratumumab  IV) q21d x 6 Cycles (Induction/Consolidation)     10/11/2022 - 10/22/2022 Chemotherapy   Patient is on Treatment Plan : MYELOMA NON-TRANSPLANT CANDIDATES VRd SQ q21d     11/04/2022 - 03/04/2023 Chemotherapy   Patient is on Treatment Plan : MYELOMA NEWLY DIAGNOSED TRANSPLANT CANDIDATE VRd q21d starting cycle 6 (Induction/Consolidation)     07/31/2023 - 07/31/2023 Chemotherapy   Patient is on Treatment Plan : MYELOMA Daratumumab  IV q28d     08/05/2023 -  Chemotherapy   Patient is on Treatment Plan : MYELOMA NEWLY DIAGNOSED TRANSPLANT CANDIDATE Daratumumab  SQ + Lenalidomide  q28d (Maintenance)      ASBMT risk stratification: Low Risk, PR1 ECOG/KPS: 0/90% HSCT-CI score: 2 (Obesity, Age >40) Preparative regimen/treatment protocol: Melphalan 200 mg/m2   03/26/2023 - Bone Marrow Transplant  AUTOLOGOUS STEM CELL TRANSPLANT: Disease status at time of transplant: PR1 ASBMT risk stratification: low risk, ECOG/KPS: 1 / 90%, HSCT-CI score: 2 Mobilization was done with G-CSF and Mozobil with a yield of 2.104 x 10(7) CD34+ cells.  Preparative regimen with Melphalan 200 mg/m2 on 03/25/2023 (outpatient). Infusion of stem cells - 7.01 x 10(6) on 03/26/2023 (outpatient) Completed stem cell process 04/08/2023. Course minimally complicated by mucositis.   ENGRAFTMENT: WBC engraftment on 04/06/2023 and platelet engraftment on 04/11/2023 at [facility]. Last platelet transfusion given 04/04/2023.  04/04/2023 - Supportive Treatment  Adult OP Blood Administration PRN x 30 DAYS Plan Provider: Dorn Dallas Ehrlich, MD   CURRENT THERAPY: DaraVRD  INTERVAL HISTORY:  Kara Medina is a 48 year old female with multiple myeloma who presents for follow-up on her treatment regimen.    She had autologous transplant on 03/26/2023.  She started post transplant revelimid on 1.30.2025  Discussed the use of AI scribe software for clinical note transcription with the patient, who gave verbal consent to proceed.  History of Present Illness   Kara Medina is a 48 year old female with multiple myeloma who presents with pneumonia.  She was diagnosed with pneumonia and has been on two antibiotics for the past three days, with one course finishing tomorrow and the other on Thursday. She is performing breathing treatments using a device to measure her breath, which she believes helps her condition. She is trying to maintain her energy levels and stay active. She mentions losing weight, which she attributes to pneumonia. She has never had pneumonia before and expresses surprise at the diagnosis. She is wearing a mask.  Her transplant anniversary is approaching on September 11th.  She is also on daratumumab  and reports doing okay with it. She recalls visiting the ER on August 2nd, where her myeloma labs were not drawn, and mentions a delay in her ER visit due to waiting in the 'blue room'.  Regarding her dental health, she has not yet addressed her tooth issue due to the timing of her last Zometa  treatment on February 18th. She needs to wait three months post-Zometa  to proceed with dental work, which includes a root canal and crown placement.  Rest of the pertinent 10 point ROS reviewed and negative  Patient Active Problem List   Diagnosis  Date Noted   Family history of mixed hyperlipidemia 01/30/2023   Obstructive sleep apnea 01/30/2023   Port-A-Cath in place 01/06/2023   HTN (hypertension) 11/14/2022   Multiple myeloma (HCC) 09/12/2022   MGUS (monoclonal gammopathy of unknown significance)  09/10/2022   Hypertensive urgency 08/29/2022   Iron deficiency anemia 08/29/2022   LVH (left ventricular hypertrophy) 08/29/2022   Normocytic anemia 08/29/2022    is allergic to tape and amoxicillin.  MEDICAL HISTORY: Past Medical History:  Diagnosis Date   Anemia    Gestational diabetes    diet controlled   Multiple myeloma (HCC)    Postpartum care following vaginal delivery (7/27) 02/09/2016   Pregnancy induced hypertension    Trigeminal neuralgia    2012 corrected with surgery    SURGICAL HISTORY: Past Surgical History:  Procedure Laterality Date   APPENDECTOMY  2008   IR IMAGING GUIDED PORT INSERTION  12/03/2022   TRIGEMINAL NERVE DECOMPRESSION  2012    SOCIAL HISTORY: Social History   Socioeconomic History   Marital status: Married    Spouse name: Not on file   Number of children: Not on file   Years of education: Not on file   Highest education level: Bachelor's degree (e.g., BA, AB, BS)  Occupational History   Not on file  Tobacco Use   Smoking status: Never   Smokeless tobacco: Never  Vaping Use   Vaping status: Never Used  Substance and Sexual Activity   Alcohol use: No   Drug use: No   Sexual activity: Yes  Other Topics Concern   Not on file  Social History Narrative   Not on file   Social Drivers of Health   Financial Resource Strain: Low Risk  (08/23/2022)   Overall Financial Resource Strain (CARDIA)    Difficulty of Paying Living Expenses: Not hard at all  Food Insecurity: No Food Insecurity (08/23/2022)   Hunger Vital Sign    Worried About Running Out of Food in the Last Year: Never true    Ran Out of Food in the Last Year: Never true  Transportation Needs: No Transportation Needs (08/23/2022)   PRAPARE - Administrator, Civil Service (Medical): No    Lack of Transportation (Non-Medical): No  Physical Activity: Insufficiently Active (08/23/2022)   Exercise Vital Sign    Days of Exercise per Week: 2 days    Minutes of Exercise per  Session: 30 min  Stress: Stress Concern Present (08/23/2022)   [employer] of Occupational Health - Occupational Stress Questionnaire    Feeling of Stress : To some extent  Social Connections: Unknown (08/23/2022)   Social Connection and Isolation Panel    Frequency of Communication with Friends and Family: More than three times a week    Frequency of Social Gatherings with Friends and Family: Once a week    Attends Religious Services: Patient declined    Database administrator or Organizations: Yes    Attends Engineer, structural: More than 4 times per year    Marital Status: Married  Catering manager Violence: Not on file    FAMILY HISTORY: Family History  Problem Relation Age of Onset   Cancer Other    Hypertension Other    Healthy Father    Cancer Paternal Grandfather        stomach, lung   Hypertension Mother    Breast cancer Mother 50   Hypertension Maternal Grandmother    Arthritis Maternal Grandmother    Hypertension Maternal Grandfather  Other Brother        ?prediabetic   Diabetes Paternal Grandmother    Hypertension Paternal Grandmother    Autism Son     PHYSICAL EXAMINATION    Vitals:   02/17/24 1345  BP: 124/68  Pulse: (!) 103  Resp: 18  Temp: 98.2 F (36.8 C)  SpO2: 100%    Physical Exam Constitutional:      General: She is not in acute distress.    Appearance: Normal appearance. She is not toxic-appearing.  HENT:     Head: Normocephalic and atraumatic.     Mouth/Throat:     Mouth: Mucous membranes are moist.     Pharynx: Oropharynx is clear. No oropharyngeal exudate or posterior oropharyngeal erythema.  Eyes:     General: No scleral icterus. Cardiovascular:     Rate and Rhythm: Normal rate and regular rhythm.     Pulses: Normal pulses.     Heart sounds: Normal heart sounds.  Pulmonary:     Effort: Pulmonary effort is normal.     Breath sounds: Normal breath sounds.  Abdominal:     General: Abdomen is flat. Bowel sounds  are normal. There is no distension.     Palpations: Abdomen is soft.     Tenderness: There is no abdominal tenderness.  Musculoskeletal:        General: No swelling.     Cervical back: Neck supple.  Lymphadenopathy:     Cervical: No cervical adenopathy.  Skin:    General: Skin is warm and dry.     Findings: No rash.  Neurological:     General: No focal deficit present.     Mental Status: She is alert.  Psychiatric:        Mood and Affect: Mood normal.        Behavior: Behavior normal.     LABORATORY DATA:  CBC    Component Value Date/Time   WBC 8.0 02/14/2024 2035   RBC 3.97 02/14/2024 2035   HGB 11.4 (L) 02/14/2024 2035   HGB 9.9 (L) 01/20/2024 1400   HCT 36.2 02/14/2024 2035   HCT 24.4 (L) 09/04/2022 1037   PLT 204 02/14/2024 2035   PLT 254 01/20/2024 1400   MCV 91.2 02/14/2024 2035   MCH 28.7 02/14/2024 2035   MCHC 31.5 02/14/2024 2035   RDW 14.3 02/14/2024 2035   LYMPHSABS 0.9 02/14/2024 2035   MONOABS 1.8 (H) 02/14/2024 2035   EOSABS 0.0 02/14/2024 2035   BASOSABS 0.1 02/14/2024 2035    CMP     Component Value Date/Time   NA 137 02/14/2024 2035   K 3.6 02/14/2024 2035   CL 104 02/14/2024 2035   CO2 21 (L) 02/14/2024 2035   GLUCOSE 153 (H) 02/14/2024 2035   BUN 14 02/14/2024 2035   CREATININE 0.86 02/14/2024 2035   CREATININE 0.82 01/20/2024 1400   CREATININE 0.95 08/23/2022 1631   CALCIUM 8.9 02/14/2024 2035   PROT 7.1 02/14/2024 2035   ALBUMIN 3.6 02/14/2024 2035   AST 24 02/14/2024 2035   AST 15 01/20/2024 1400   ALT 22 02/14/2024 2035   ALT 15 01/20/2024 1400   ALKPHOS 63 02/14/2024 2035   BILITOT 0.9 02/14/2024 2035   BILITOT 0.3 01/20/2024 1400   GFRNONAA >60 02/14/2024 2035   GFRNONAA >60 01/20/2024 1400   GFRAA >60 02/09/2016 0525    Last M protein at [facility], minute M spike to quantitate  ASSESSMENT and THERAPY PLAN:   No problem-specific Assessment & Plan notes found  for this encounter.  Multiple Myeloma Ig G Kappa MM, status post  daratumumab  Revlimid  and dexamethasone  induction followed by autologous stem cell transplant in September 2024. Post-transplant, PR1 with M spike reduction to 0.23 grams. MRD positive at 0.1593%. She is on maintenance with daratumumab  and Revlimid  for 2-3 yrs - Daratumumab  subcu every 28 days, Revlimid  10 mg daily from day 1 to day 21 followed by 1 week off -Continue Acyclovir  for a total of 1 year post-transplant.  Assessment and Plan Assessment & Plan Multiple myeloma, post-transplant, on maintenance therapy with Revlimid  and daratumumab  Pneumonia impacted treatment schedule. Revlimid  is more immunosuppressive than daratumumab . A one-week delay in daratumumab  does not affect outcomes. - Administer daratumumab  infusion today. - Start Revlimid  week off today, resume August 12th. - Refill Revlimid  prescription through [facility].  Pneumonia under active treatment Symptoms improving with antibiotics. - Continue current antibiotic regimen. She appears well no concerns for resp distress. No adv sounds on exam. I asked her to hold revelimid and take her week off now, restart cycle next week. - Advise wearing a mask.  Anemia secondary to multiple myeloma Anemia likely due to multiple myeloma and treatment.   Dental disorder requiring root canal and crown, pending treatment and affecting bisphosphonate therapy Dental disorder requires root canal and crown. Zometa  stopped in February, allowing dental treatment to proceed. - Proceed with dental treatment for root canal and crown. - Restart Zometa  therapy once dental treatment is completed and healed.   All questions were answered. The patient knows to call the clinic with any problems, questions or concerns. We can certainly see the patient much sooner if necessary.  Total encounter time:40 minutes*in face-to-face visit time, chart review, lab review, care coordination, order entry, and documentation of the encounter  time.   *Total Encounter Time as defined by the Centers for Medicare and Medicaid Services includes, in addition to the face-to-face time of a patient visit (documented in the note above) non-face-to-face time: obtaining and reviewing outside history, ordering and reviewing medications, tests or procedures, care coordination (communications with other health care professionals or caregivers) and documentation in the medical record.

## 2024-02-18 LAB — KAPPA/LAMBDA LIGHT CHAINS
Kappa free light chain: 24 mg/L — ABNORMAL HIGH (ref 3.3–19.4)
Kappa, lambda light chain ratio: 1.64 (ref 0.26–1.65)
Lambda free light chains: 14.6 mg/L (ref 5.7–26.3)

## 2024-02-18 LAB — BETA 2 MICROGLOBULIN, SERUM: Beta-2 Microglobulin: 1.6 mg/L (ref 0.6–2.4)

## 2024-02-19 LAB — IFE, DARA-SPECIFIC, SERUM
IgA: 229 mg/dL (ref 87–352)
IgG (Immunoglobin G), Serum: 746 mg/dL (ref 586–1602)
IgM (Immunoglobulin M), Srm: 30 mg/dL (ref 26–217)

## 2024-02-20 LAB — PROTEIN ELECTROPHORESIS, SERUM
A/G Ratio: 1 (ref 0.7–1.7)
Albumin ELP: 2.9 g/dL (ref 2.9–4.4)
Alpha-1-Globulin: 0.3 g/dL (ref 0.0–0.4)
Alpha-2-Globulin: 1 g/dL (ref 0.4–1.0)
Beta Globulin: 1 g/dL (ref 0.7–1.3)
Gamma Globulin: 0.6 g/dL (ref 0.4–1.8)
Globulin, Total: 2.9 g/dL (ref 2.2–3.9)
M-Spike, %: 0.3 g/dL — ABNORMAL HIGH
Total Protein ELP: 5.8 g/dL — ABNORMAL LOW (ref 6.0–8.5)

## 2024-02-20 LAB — CULTURE, BLOOD (ROUTINE X 2)
Culture: NO GROWTH
Special Requests: ADEQUATE

## 2024-02-20 NOTE — ED Notes (Signed)
 02/20/24 1208 Left message for pt to return call to writer.

## 2024-02-20 NOTE — ED Notes (Signed)
 02/20/24 0900 left phone number for pt to return call regarding (+) Blood cultures. It has been requested that she return for further evaluation.

## 2024-02-21 LAB — CULTURE, BLOOD (ROUTINE X 2): Special Requests: ADEQUATE

## 2024-02-22 ENCOUNTER — Telehealth (HOSPITAL_BASED_OUTPATIENT_CLINIC_OR_DEPARTMENT_OTHER): Payer: Self-pay | Admitting: *Deleted

## 2024-02-22 NOTE — Telephone Encounter (Signed)
 Post ED Visit - Positive Culture Follow-up  Culture report reviewed by antimicrobial stewardship pharmacist: [facility] []  Rankin Dee, Pharm.D. []  Venetia Gully, [address].D., BCPS AQ-ID []  Garrel Crews, Pharm.D., BCPS []  Almarie Lunger, Pharm.D., BCPS []  [place], [address].D., BCPS, AAHIVP []  Rosaline Bihari, Pharm.D., BCPS, AAHIVP []  Vernell Meier, PharmD, BCPS []  Latanya Hint, PharmD, BCPS []  Donald Medley, PharmD, BCPS []  Rocky Bold, PharmD []  Dorothyann Alert, PharmD, BCPS []  Morene Babe, PharmD  [facility] []  Rosaline Edison, PharmD []  Romona Bliss, PharmD []  Dolphus Roller, PharmD []  Veva Seip, Rph []  Vernell Daunt) Leonce, PharmD []  Eva Allis, PharmD []  Rosaline Millet, PharmD []  Iantha Batch, PharmD []  Arvin Gauss, PharmD []  Wanda Hasting, PharmD [x]  Ronal Rav, PharmD []  Rocky Slade, PharmD []  Bard Jeans, PharmD   Positive blood culture Per chart review, pt was contacted on 8/18 x 2-left message to return call/ return to ED for evaluation of + blood culture.  Action has already been taken.  Pt did have 8/5 office visit with Oncologist  Albino Alan Novak 02/22/2024, 11:49 AM

## 2024-03-08 ENCOUNTER — Encounter: Payer: Self-pay | Admitting: Internal Medicine

## 2024-03-08 NOTE — Telephone Encounter (Signed)
 error

## 2024-03-11 ENCOUNTER — Other Ambulatory Visit: Payer: Self-pay | Admitting: Hematology and Oncology

## 2024-03-11 ENCOUNTER — Other Ambulatory Visit: Payer: Self-pay

## 2024-03-11 DIAGNOSIS — C9000 Multiple myeloma not having achieved remission: Secondary | ICD-10-CM

## 2024-03-11 DIAGNOSIS — D472 Monoclonal gammopathy: Secondary | ICD-10-CM

## 2024-03-11 MED ORDER — LENALIDOMIDE 10 MG PO CAPS: 10.0000 mg | ORAL_CAPSULE | Freq: Every day | ORAL | 0 refills | Status: DC

## 2024-03-16 ENCOUNTER — Encounter: Payer: Self-pay | Admitting: Hematology and Oncology

## 2024-03-16 ENCOUNTER — Other Ambulatory Visit: Payer: Self-pay | Admitting: Hematology and Oncology

## 2024-03-16 ENCOUNTER — Inpatient Hospital Stay: Attending: Hematology and Oncology

## 2024-03-16 ENCOUNTER — Inpatient Hospital Stay: Attending: Hematology and Oncology | Admitting: Hematology and Oncology

## 2024-03-16 ENCOUNTER — Inpatient Hospital Stay

## 2024-03-16 VITALS — BP 120/88 | HR 78 | Temp 97.4°F | Resp 14 | Wt 242.9 lb

## 2024-03-16 DIAGNOSIS — D472 Monoclonal gammopathy: Secondary | ICD-10-CM

## 2024-03-16 DIAGNOSIS — C9000 Multiple myeloma not having achieved remission: Secondary | ICD-10-CM | POA: Insufficient documentation

## 2024-03-16 DIAGNOSIS — C9001 Multiple myeloma in remission: Secondary | ICD-10-CM

## 2024-03-16 DIAGNOSIS — Z5112 Encounter for antineoplastic immunotherapy: Secondary | ICD-10-CM | POA: Insufficient documentation

## 2024-03-16 DIAGNOSIS — Z79620 Long term (current) use of immunosuppressive biologic: Secondary | ICD-10-CM | POA: Diagnosis not present

## 2024-03-16 DIAGNOSIS — D649 Anemia, unspecified: Secondary | ICD-10-CM

## 2024-03-16 LAB — CBC WITH DIFFERENTIAL/PLATELET
Abs Immature Granulocytes: 0.01 K/uL (ref 0.00–0.07)
Basophils Absolute: 0.1 K/uL (ref 0.0–0.1)
Basophils Relative: 2 %
Eosinophils Absolute: 0.4 K/uL (ref 0.0–0.5)
Eosinophils Relative: 11 %
HCT: 32.4 % — ABNORMAL LOW (ref 36.0–46.0)
Hemoglobin: 10.6 g/dL — ABNORMAL LOW (ref 12.0–15.0)
Immature Granulocytes: 0 %
Lymphocytes Relative: 38 %
Lymphs Abs: 1.2 K/uL (ref 0.7–4.0)
MCH: 29.2 pg (ref 26.0–34.0)
MCHC: 32.7 g/dL (ref 30.0–36.0)
MCV: 89.3 fL (ref 80.0–100.0)
Monocytes Absolute: 0.5 K/uL (ref 0.1–1.0)
Monocytes Relative: 15 %
Neutro Abs: 1.1 K/uL — ABNORMAL LOW (ref 1.7–7.7)
Neutrophils Relative %: 34 %
Platelets: 193 K/uL (ref 150–400)
RBC: 3.63 MIL/uL — ABNORMAL LOW (ref 3.87–5.11)
RDW: 14.2 % (ref 11.5–15.5)
WBC: 3.4 K/uL — ABNORMAL LOW (ref 4.0–10.5)
nRBC: 0 % (ref 0.0–0.2)

## 2024-03-16 LAB — CMP (CANCER CENTER ONLY)
ALT: 19 U/L (ref 0–44)
AST: 20 U/L (ref 15–41)
Albumin: 3.9 g/dL (ref 3.5–5.0)
Alkaline Phosphatase: 69 U/L (ref 38–126)
Anion gap: 5 (ref 5–15)
BUN: 15 mg/dL (ref 6–20)
CO2: 27 mmol/L (ref 22–32)
Calcium: 9.1 mg/dL (ref 8.9–10.3)
Chloride: 108 mmol/L (ref 98–111)
Creatinine: 0.69 mg/dL (ref 0.44–1.00)
GFR, Estimated: >60 mL/min (ref >60)
Glucose, Bld: 102 mg/dL — ABNORMAL HIGH (ref 70–99)
Potassium: 3.5 mmol/L (ref 3.5–5.1)
Sodium: 140 mmol/L (ref 135–145)
Total Bilirubin: 0.4 mg/dL (ref 0.0–1.2)
Total Protein: 6.2 g/dL — ABNORMAL LOW (ref 6.5–8.1)

## 2024-03-16 LAB — PREGNANCY, URINE: Preg Test, Ur: NEGATIVE

## 2024-03-16 MED ORDER — ACETAMINOPHEN 325 MG PO TABS
650.0000 mg | ORAL_TABLET | Freq: Once | ORAL | Status: AC
  Administered 2024-03-16: 650 mg via ORAL
  Filled 2024-03-16: qty 2

## 2024-03-16 MED ORDER — DARATUMUMAB-HYALURONIDASE-FIHJ 1800-30000 MG-UT/15ML ~~LOC~~ SOLN
1800.0000 mg | Freq: Once | SUBCUTANEOUS | Status: AC
  Administered 2024-03-16: 1800 mg via SUBCUTANEOUS
  Filled 2024-03-16: qty 15

## 2024-03-16 MED ORDER — DEXAMETHASONE 4 MG PO TABS
20.0000 mg | ORAL_TABLET | Freq: Once | ORAL | Status: AC
  Administered 2024-03-16: 20 mg via ORAL
  Filled 2024-03-16: qty 5

## 2024-03-16 MED ORDER — DIPHENHYDRAMINE HCL 25 MG PO CAPS
25.0000 mg | ORAL_CAPSULE | Freq: Once | ORAL | Status: AC
  Administered 2024-03-16: 25 mg via ORAL
  Filled 2024-03-16: qty 1

## 2024-03-16 NOTE — Progress Notes (Signed)
 Per Dr Odean, okay to proceed with only 30 minute wait following pre-medications due to long wait for labs to be resulted and okay to treat.

## 2024-03-16 NOTE — Patient Instructions (Signed)
 [facility] - A DEPT OF [facility]  Discharge Instructions: Thank you for choosing [facility] to provide your oncology and hematology care.   If you have a lab appointment with the Cancer Center, please go directly to the Cancer Center and check in at the registration area.   Wear comfortable clothing and clothing appropriate for easy access to any Portacath or PICC line.   We strive to give you quality time with your provider. You may need to reschedule your appointment if you arrive late (15 or more minutes).  Arriving late affects you and other patients whose appointments are after yours.  Also, if you miss three or more appointments without notifying the office, you may be dismissed from the clinic at the provider's discretion.      For prescription refill requests, have your pharmacy contact our office and allow 72 hours for refills to be completed.    Today you received the following chemotherapy and/or immunotherapy agent: Daratumumab (Darzalex Faspro)      To help prevent nausea and vomiting after your treatment, we encourage you to take your nausea medication as directed.  BELOW ARE SYMPTOMS THAT SHOULD BE REPORTED IMMEDIATELY: *FEVER GREATER THAN 100.4 F (38 C) OR HIGHER *CHILLS OR SWEATING *NAUSEA AND VOMITING THAT IS NOT CONTROLLED WITH YOUR NAUSEA MEDICATION *UNUSUAL SHORTNESS OF BREATH *UNUSUAL BRUISING OR BLEEDING *URINARY PROBLEMS (pain or burning when urinating, or frequent urination) *BOWEL PROBLEMS (unusual diarrhea, constipation, pain near the anus) TENDERNESS IN MOUTH AND THROAT WITH OR WITHOUT PRESENCE OF ULCERS (sore throat, sores in mouth, or a toothache) UNUSUAL RASH, SWELLING OR PAIN  UNUSUAL VAGINAL DISCHARGE OR ITCHING   Items with * indicate a potential emergency and should be followed up as soon as possible or go to the Emergency Department if any problems should occur.  Please show the CHEMOTHERAPY ALERT  CARD or IMMUNOTHERAPY ALERT CARD at check-in to the Emergency Department and triage nurse.  Should you have questions after your visit or need to cancel or reschedule your appointment, please contact [facility] - A DEPT OF [facility]  Dept: [phone]  and follow the prompts.  Office hours are 8:00 a.m. to 4:30 p.m. Monday - Friday. Please note that voicemails left after 4:00 p.m. may not be returned until the following business day.  We are closed weekends and major holidays. You have access to a nurse at all times for urgent questions. Please call the main number to the clinic Dept: [phone] and follow the prompts.   For any non-urgent questions, you may also contact your provider using MyChart. We now offer e-Visits for anyone 39 and older to request care online for non-urgent symptoms. For details visit [email or web address].   Also download the MyChart app! Go to the app store, search "MyChart", open the app, select [facility], and log in with your MyChart username and password.  Daratumumab; Hyaluronidase Injection What is this medication? DARATUMUMAB; HYALURONIDASE (dar a toom ue mab; hye al ur ON i dase) treats multiple myeloma, a type of bone marrow cancer. Daratumumab works by blocking a protein that causes cancer cells to grow and multiply. This helps to slow or stop the spread of cancer cells. Hyaluronidase works by increasing the absorption of other medications in the body to help them work better. This medication may also be used treat amyloidosis, a condition that causes the buildup of a protein (amyloid) in  your body. It works by reducing the buildup of this protein, which decreases symptoms. It is a combination medication that contains a monoclonal antibody. This medicine may be used for other purposes; ask your health care provider or pharmacist if you have questions. COMMON BRAND NAME(S): DARZALEX FASPRO What should I tell my care team  before I take this medication? They need to know if you have any of these conditions: Heart disease Infection, such as chickenpox, cold sores, herpes, hepatitis B Lung or breathing disease An unusual or allergic reaction to daratumumab, hyaluronidase, other medications, foods, dyes, or preservatives Pregnant or trying to get pregnant Breast-feeding How should I use this medication? This medication is injected under the skin. It is given by your care team in a hospital or clinic setting. Talk to your care team about the use of this medication in children. Special care may be needed. Overdosage: If you think you have taken too much of this medicine contact a poison control center or emergency room at once. NOTE: This medicine is only for you. Do not share this medicine with others. What if I miss a dose? Keep appointments for follow-up doses. It is important not to miss your dose. Call your care team if you are unable to keep an appointment. What may interact with this medication? Interactions have not been studied. This list may not describe all possible interactions. Give your health care provider a list of all the medicines, herbs, non-prescription drugs, or dietary supplements you use. Also tell them if you smoke, drink alcohol, or use illegal drugs. Some items may interact with your medicine. What should I watch for while using this medication? Your condition will be monitored carefully while you are receiving this medication. This medication can cause serious allergic reactions. To reduce your risk, your care team may give you other medication to take before receiving this one. Be sure to follow the directions from your care team. This medication can affect the results of blood tests to match your blood type. These changes can last for up to 6 months after the final dose. Your care team will do blood tests to match your blood type before you start treatment. Tell all of your care team that  you are being treated with this medication before receiving a blood transfusion. This medication can affect the results of some tests used to determine treatment response; extra tests may be needed to evaluate response. Talk to your care team if you wish to become pregnant or think you are pregnant. This medication can cause serious birth defects if taken during pregnancy and for 3 months after the last dose. A reliable form of contraception is recommended while taking this medication and for 3 months after the last dose. Talk to your care team about effective forms of contraception. Do not breast-feed while taking this medication. What side effects may I notice from receiving this medication? Side effects that you should report to your care team as soon as possible: Allergic reactions--skin rash, itching, hives, swelling of the face, lips, tongue, or throat Heart rhythm changes--fast or irregular heartbeat, dizziness, feeling faint or lightheaded, chest pain, trouble breathing Infection--fever, chills, cough, sore throat, wounds that don't heal, pain or trouble when passing urine, general feeling of discomfort or being unwell Infusion reactions--chest pain, shortness of breath or trouble breathing, feeling faint or lightheaded Sudden eye pain or change in vision such as blurry vision, seeing halos around lights, vision loss Unusual bruising or bleeding Side effects that usually  do not require medical attention (report to your care team if they continue or are bothersome): Constipation Diarrhea Fatigue Nausea Pain, tingling, or numbness in the hands or feet Swelling of the ankles, hands, or feet This list may not describe all possible side effects. Call your doctor for medical advice about side effects. You may report side effects to FDA at 1-800-FDA-1088. Where should I keep my medication? This medication is given in a hospital or clinic. It will not be stored at home. NOTE: This sheet is a  summary. It may not cover all possible information. If you have questions about this medicine, talk to your doctor, pharmacist, or health care provider.  2024 Elsevier/Gold Standard (2021-11-06 00:00:00)

## 2024-03-16 NOTE — Progress Notes (Signed)
 Per MD, OK to treat today with ANC 1.1.

## 2024-03-16 NOTE — Progress Notes (Signed)
 [facility] Cancer Follow up:    Kara Clotilda SAUNDERS, MD [address] [place] [address]   DIAGNOSIS: Multiple Myeloma  SUMMARY OF ONCOLOGIC HISTORY: Oncology History  Multiple myeloma (HCC)  09/12/2022 Initial Diagnosis   Multiple myeloma (HCC)   09/20/2022 - 10/04/2022 Chemotherapy   Patient is on Treatment Plan : MYELOMA NEWLY DIAGNOSED TRANSPLANT CANDIDATE DaraVRd (Daratumumab  IV) q21d x 6 Cycles (Induction/Consolidation)     10/11/2022 - 10/22/2022 Chemotherapy   Patient is on Treatment Plan : MYELOMA NON-TRANSPLANT CANDIDATES VRd SQ q21d     11/04/2022 - 03/04/2023 Chemotherapy   Patient is on Treatment Plan : MYELOMA NEWLY DIAGNOSED TRANSPLANT CANDIDATE VRd q21d starting cycle 6 (Induction/Consolidation)     07/31/2023 - 07/31/2023 Chemotherapy   Patient is on Treatment Plan : MYELOMA Daratumumab  IV q28d     08/05/2023 -  Chemotherapy   Patient is on Treatment Plan : MYELOMA NEWLY DIAGNOSED TRANSPLANT CANDIDATE Daratumumab  SQ + Lenalidomide  q28d (Maintenance)      ASBMT risk stratification: Low Risk, PR1 ECOG/KPS: 0/90% HSCT-CI score: 2 (Obesity, Age >40) Preparative regimen/treatment protocol: Melphalan 200 mg/m2   03/26/2023 - Bone Marrow Transplant  AUTOLOGOUS STEM CELL TRANSPLANT: Disease status at time of transplant: PR1 ASBMT risk stratification: low risk, ECOG/KPS: 1 / 90%, HSCT-CI score: 2 Mobilization was done with G-CSF and Mozobil with a yield of 2.104 x 10(7) CD34+ cells.  Preparative regimen with Melphalan 200 mg/m2 on 03/25/2023 (outpatient). Infusion of stem cells - 7.01 x 10(6) on 03/26/2023 (outpatient) Completed stem cell process 04/08/2023. Course minimally complicated by mucositis.   ENGRAFTMENT: WBC engraftment on 04/06/2023 and platelet engraftment on 04/11/2023 at [facility]. Last platelet transfusion given 04/04/2023.  04/04/2023 - Supportive Treatment  Adult OP Blood Administration PRN x 30 DAYS Plan Provider: Dorn Dallas Ehrlich, MD   CURRENT THERAPY: DaraVRD  INTERVAL HISTORY:  Kara Medina is a 48 year old female with multiple myeloma who presents for follow-up on her treatment regimen.    She had autologous transplant on 03/26/2023.  She started post transplant revelimid on 1.30.2025  Discussed the use of AI scribe software for clinical note transcription with the patient, who gave verbal consent to proceed.  History of Present Illness   Kara Medina is a 48 year old female with multiple myeloma.   Kara Medina is a 48 year old female with multiple myeloma who presents for follow-up regarding her cancer status and menopausal symptoms.  She is concerned about her cancer status, specifically inquiring about her M spike and monoclonal protein levels. She reports that her most recent M spike was 0.3 grams, which is less than when she first started treatment. She is on a regimen of daratumumab , Revlimid , and furosemide, administered monthly. She needs to schedule her September treatment and have her Revlimid  prescription sent in.  She is experiencing symptoms potentially related to menopause, such as night sweats and hot flashes occurring throughout the day and night. She is uncertain if these symptoms are due to menopause or her cancer treatment. She has not menstruated since receiving medication before her transplant, and it has been nearly a year since her last cycle. She reports symptoms such as night sweats and hot flashes, and is unsure if these are due to menopause, cancer, or her medication.  She is concerned about the potential return of her menstrual cycle and its impact on her cancer, particularly regarding blood loss and anemia.  She discussed dental issues related to her  treatment with Zometa , a bone strengthener. She has not completed a dental procedure due to a potential root canal and is awaiting dental clearance to resume Zometa . Her dentist is at [facility] on [address].  No bowel movement issues and no pain.  Rest of the pertinent 10 point ROS reviewed and negative  Patient Active Problem List   Diagnosis Date Noted   Family history of mixed hyperlipidemia 01/30/2023   Obstructive sleep apnea 01/30/2023   Port-A-Cath in place 01/06/2023   HTN (hypertension) 11/14/2022   Multiple myeloma (HCC) 09/12/2022   MGUS (monoclonal gammopathy of unknown significance) 09/10/2022   Hypertensive urgency 08/29/2022   Iron deficiency anemia 08/29/2022   LVH (left ventricular hypertrophy) 08/29/2022   Normocytic anemia 08/29/2022    is allergic to tape and amoxicillin.  MEDICAL HISTORY: Past Medical History:  Diagnosis Date   Anemia    Gestational diabetes    diet controlled   Multiple myeloma (HCC)    Postpartum care following vaginal delivery (7/27) 02/09/2016   Pregnancy induced hypertension    Trigeminal neuralgia    2012 corrected with surgery    SURGICAL HISTORY: Past Surgical History:  Procedure Laterality Date   APPENDECTOMY  2008   IR IMAGING GUIDED PORT INSERTION  12/03/2022   TRIGEMINAL NERVE DECOMPRESSION  2012    SOCIAL HISTORY: Social History   Socioeconomic History   Marital status: Married    Spouse name: Not on file   Number of children: Not on file   Years of education: Not on file   Highest education level: Bachelor's degree (e.g., BA, AB, BS)  Occupational History   Not on file  Tobacco Use   Smoking status: Never   Smokeless tobacco: Never  Vaping Use   Vaping status: Never Used  Substance and Sexual Activity   Alcohol use: No   Drug use: No   Sexual activity: Yes  Other Topics Concern   Not on file  Social History Narrative   Not on file   Social Drivers of Health   Financial Resource Strain: Low Risk  (08/23/2022)   Overall Financial Resource Strain (CARDIA)    Difficulty of Paying Living Expenses: Not hard at all  Food Insecurity: No Food Insecurity (08/23/2022)   Hunger Vital Sign    Worried About  Running Out of Food in the Last Year: Never true    Ran Out of Food in the Last Year: Never true  Transportation Needs: No Transportation Needs (08/23/2022)   PRAPARE - Administrator, Civil Service (Medical): No    Lack of Transportation (Non-Medical): No  Physical Activity: Insufficiently Active (08/23/2022)   Exercise Vital Sign    Days of Exercise per Week: 2 days    Minutes of Exercise per Session: 30 min  Stress: Stress Concern Present (08/23/2022)   [employer] of Occupational Health - Occupational Stress Questionnaire    Feeling of Stress : To some extent  Social Connections: Unknown (08/23/2022)   Social Connection and Isolation Panel    Frequency of Communication with Friends and Family: More than three times a week    Frequency of Social Gatherings with Friends and Family: Once a week    Attends Religious Services: Patient declined    Database administrator or Organizations: Yes    Attends Engineer, structural: More than 4 times per year    Marital Status: Married  Catering manager Violence: Not on file    FAMILY HISTORY: Family History  Problem Relation Age of Onset   Cancer Other    Hypertension Other    Healthy Father    Cancer Paternal Grandfather        stomach, lung   Hypertension Mother    Breast cancer Mother 41   Hypertension Maternal Grandmother    Arthritis Maternal Grandmother    Hypertension Maternal Grandfather    Other Brother        ?prediabetic   Diabetes Paternal Grandmother    Hypertension Paternal Grandmother    Autism Son     PHYSICAL EXAMINATION   Onc Performance Status - 03/16/24 1300       KPS SCALE   KPS % SCORE Able to carry on normal activity, minor s/s of disease          Vitals:   03/16/24 1345  BP: 120/88  Pulse: 78  Resp: 14  Temp: (!) 97.4 F (36.3 C)  SpO2: 100%    Physical Exam Constitutional:      General: She is not in acute distress.    Appearance: Normal appearance. She is not  toxic-appearing.  HENT:     Head: Normocephalic and atraumatic.     Mouth/Throat:     Mouth: Mucous membranes are moist.     Pharynx: Oropharynx is clear. No oropharyngeal exudate or posterior oropharyngeal erythema.  Eyes:     General: No scleral icterus. Cardiovascular:     Rate and Rhythm: Normal rate and regular rhythm.     Pulses: Normal pulses.     Heart sounds: Normal heart sounds.  Pulmonary:     Effort: Pulmonary effort is normal.     Breath sounds: Normal breath sounds.  Abdominal:     General: Abdomen is flat. Bowel sounds are normal. There is no distension.     Palpations: Abdomen is soft.     Tenderness: There is no abdominal tenderness.  Musculoskeletal:        General: No swelling.     Cervical back: Neck supple.  Lymphadenopathy:     Cervical: No cervical adenopathy.  Skin:    General: Skin is warm and dry.     Findings: No rash.  Neurological:     General: No focal deficit present.     Mental Status: She is alert.  Psychiatric:        Mood and Affect: Mood normal.        Behavior: Behavior normal.     LABORATORY DATA:  CBC    Component Value Date/Time   WBC 5.2 02/17/2024 1319   WBC 8.0 02/14/2024 2035   RBC 3.20 (L) 02/17/2024 1319   HGB 9.2 (L) 02/17/2024 1319   HCT 27.8 (L) 02/17/2024 1319   HCT 24.4 (L) 09/04/2022 1037   PLT 205 02/17/2024 1319   MCV 86.9 02/17/2024 1319   MCH 28.8 02/17/2024 1319   MCHC 33.1 02/17/2024 1319   RDW 14.0 02/17/2024 1319   LYMPHSABS 1.0 02/17/2024 1319   MONOABS 1.0 02/17/2024 1319   EOSABS 0.4 02/17/2024 1319   BASOSABS 0.1 02/17/2024 1319    CMP     Component Value Date/Time   NA 137 02/17/2024 1319   K 3.4 (L) 02/17/2024 1319   CL 103 02/17/2024 1319   CO2 28 02/17/2024 1319   GLUCOSE 102 (H) 02/17/2024 1319   BUN 18 02/17/2024 1319   CREATININE 0.72 02/17/2024 1319   CREATININE 0.95 08/23/2022 1631   CALCIUM 8.5 (L) 02/17/2024 1319   PROT 6.3 (L) 02/17/2024 1319  ALBUMIN 3.7 02/17/2024 1319    AST 22 02/17/2024 1319   ALT 24 02/17/2024 1319   ALKPHOS 54 02/17/2024 1319   BILITOT 0.5 02/17/2024 1319   GFRNONAA >60 02/17/2024 1319   GFRAA >60 02/09/2016 0525    Last M protein at [facility], minute M spike to quantitate  ASSESSMENT and THERAPY PLAN:   No problem-specific Assessment & Plan notes found for this encounter.  Multiple Myeloma Ig G Kappa MM, status post daratumumab  Revlimid  and dexamethasone  induction followed by autologous stem cell transplant in September 2024. Post-transplant, PR1 with M spike reduction to 0.23 grams. MRD positive at 0.1593%. She is on maintenance with daratumumab  and Revlimid  for 2-3 yrs - Daratumumab  subcu every 28 days, Revlimid  10 mg daily from day 1 to day 21 followed by 1 week off -Continue Acyclovir  for a total of 1 year post-transplant.  Assessment and Plan Assessment & Plan Multiple myeloma, post-transplant, on maintenance therapy with Revlimid  and daratumumab  Multiple myeloma stable with unchanged M spike at 0.3 grams. Current treatment effective. Dental clearance needed before Zometa  due to potential complications. - Continue daratumumab , Revlimid , dex  - Schedule September treatment. - Ensure Revlimid  prescription is sent and filled. - Obtain dental clearance from [facility] before resuming Zometa .  Perimenopausal symptoms Symptoms consistent with perimenopause, likely chemotherapy-induced amenorrhea.  - Order estradiol , LH, and FSH labs to assess menopausal status.  All questions were answered. The patient knows to call the clinic with any problems, questions or concerns. We can certainly see the patient much sooner if necessary.  Total encounter time:30 minutes*in face-to-face visit time, chart review, lab review, care coordination, order entry, and documentation of the encounter time.   *Total Encounter Time as defined by the Centers for Medicare and Medicaid Services includes, in addition to the face-to-face time of a  patient visit (documented in the note above) non-face-to-face time: obtaining and reviewing outside history, ordering and reviewing medications, tests or procedures, care coordination (communications with other health care professionals or caregivers) and documentation in the medical record.

## 2024-03-17 LAB — FOLLICLE STIMULATING HORMONE: FSH: 29.8 m[IU]/mL

## 2024-03-17 LAB — ESTRADIOL: Estradiol: 8.3 pg/mL

## 2024-03-17 LAB — KAPPA/LAMBDA LIGHT CHAINS
Kappa free light chain: 21.1 mg/L — ABNORMAL HIGH (ref 3.3–19.4)
Kappa, lambda light chain ratio: 1.77 — ABNORMAL HIGH (ref 0.26–1.65)
Lambda free light chains: 11.9 mg/L (ref 5.7–26.3)

## 2024-03-17 LAB — LUTEINIZING HORMONE: LH: 34.7 m[IU]/mL

## 2024-03-18 ENCOUNTER — Ambulatory Visit: Payer: Self-pay | Admitting: Hematology and Oncology

## 2024-03-18 ENCOUNTER — Other Ambulatory Visit: Payer: Self-pay

## 2024-03-18 LAB — PROTEIN ELECTROPHORESIS, SERUM
A/G Ratio: 1.3 (ref 0.7–1.7)
Albumin ELP: 3.3 g/dL (ref 2.9–4.4)
Alpha-1-Globulin: 0.2 g/dL (ref 0.0–0.4)
Alpha-2-Globulin: 0.7 g/dL (ref 0.4–1.0)
Beta Globulin: 1 g/dL (ref 0.7–1.3)
Gamma Globulin: 0.6 g/dL (ref 0.4–1.8)
Globulin, Total: 2.5 g/dL (ref 2.2–3.9)
M-Spike, %: 0.3 g/dL — ABNORMAL HIGH
Total Protein ELP: 5.8 g/dL — ABNORMAL LOW (ref 6.0–8.5)

## 2024-03-18 LAB — IFE, DARA-SPECIFIC, SERUM
IgA: 195 mg/dL (ref 87–352)
IgG (Immunoglobin G), Serum: 727 mg/dL (ref 586–1602)
IgM (Immunoglobulin M), Srm: 28 mg/dL (ref 26–217)

## 2024-03-18 LAB — BETA 2 MICROGLOBULIN, SERUM: Beta-2 Microglobulin: 1.6 mg/L (ref 0.6–2.4)

## 2024-03-19 ENCOUNTER — Other Ambulatory Visit: Payer: Self-pay | Admitting: *Deleted

## 2024-03-19 ENCOUNTER — Telehealth: Payer: Self-pay | Admitting: *Deleted

## 2024-03-19 DIAGNOSIS — C9000 Multiple myeloma not having achieved remission: Secondary | ICD-10-CM

## 2024-03-19 DIAGNOSIS — D472 Monoclonal gammopathy: Secondary | ICD-10-CM

## 2024-03-19 MED ORDER — LENALIDOMIDE 10 MG PO CAPS: 10.0000 mg | ORAL_CAPSULE | Freq: Every day | ORAL | 0 refills | Status: DC

## 2024-03-19 NOTE — Telephone Encounter (Signed)
 Faxed dental clearance to [facility] [phone]. Confirmation of fax received

## 2024-03-24 ENCOUNTER — Other Ambulatory Visit: Payer: Self-pay

## 2024-03-24 ENCOUNTER — Telehealth: Payer: Self-pay | Admitting: Hematology and Oncology

## 2024-03-24 DIAGNOSIS — C9000 Multiple myeloma not having achieved remission: Secondary | ICD-10-CM

## 2024-03-24 DIAGNOSIS — D472 Monoclonal gammopathy: Secondary | ICD-10-CM

## 2024-03-24 NOTE — Telephone Encounter (Signed)
 Scheduled appointments per [facility]Q. Talked with the patient and she is aware of all made appointments.

## 2024-03-24 NOTE — Progress Notes (Signed)
 Orders placed for estradiol , LH and FSH to be drawn in December.

## 2024-03-25 ENCOUNTER — Other Ambulatory Visit: Payer: Self-pay

## 2024-03-31 ENCOUNTER — Encounter: Payer: Self-pay | Admitting: Hematology and Oncology

## 2024-04-08 ENCOUNTER — Other Ambulatory Visit: Payer: Self-pay

## 2024-04-09 ENCOUNTER — Other Ambulatory Visit: Payer: Self-pay | Admitting: *Deleted

## 2024-04-09 ENCOUNTER — Other Ambulatory Visit: Payer: Self-pay | Admitting: Hematology and Oncology

## 2024-04-09 DIAGNOSIS — D472 Monoclonal gammopathy: Secondary | ICD-10-CM

## 2024-04-09 DIAGNOSIS — C9000 Multiple myeloma not having achieved remission: Secondary | ICD-10-CM

## 2024-04-13 ENCOUNTER — Encounter: Payer: Self-pay | Admitting: Hematology and Oncology

## 2024-04-13 ENCOUNTER — Inpatient Hospital Stay

## 2024-04-13 ENCOUNTER — Inpatient Hospital Stay: Admitting: Hematology and Oncology

## 2024-04-13 VITALS — BP 125/76 | HR 91 | Temp 98.1°F | Resp 16 | Wt 237.2 lb

## 2024-04-13 DIAGNOSIS — Z5112 Encounter for antineoplastic immunotherapy: Secondary | ICD-10-CM | POA: Diagnosis not present

## 2024-04-13 DIAGNOSIS — D472 Monoclonal gammopathy: Secondary | ICD-10-CM | POA: Diagnosis not present

## 2024-04-13 DIAGNOSIS — C9000 Multiple myeloma not having achieved remission: Secondary | ICD-10-CM

## 2024-04-13 DIAGNOSIS — Z95828 Presence of other vascular implants and grafts: Secondary | ICD-10-CM

## 2024-04-13 DIAGNOSIS — C9001 Multiple myeloma in remission: Secondary | ICD-10-CM

## 2024-04-13 DIAGNOSIS — D649 Anemia, unspecified: Secondary | ICD-10-CM

## 2024-04-13 LAB — CBC WITH DIFFERENTIAL (CANCER CENTER ONLY)
Abs Immature Granulocytes: 0.01 10*3/uL (ref 0.00–0.07)
Basophils Absolute: 0.1 10*3/uL (ref 0.0–0.1)
Basophils Relative: 2 %
Eosinophils Absolute: 0.4 10*3/uL (ref 0.0–0.5)
Eosinophils Relative: 11 %
HCT: 31.4 % — ABNORMAL LOW (ref 36.0–46.0)
Hemoglobin: 10.2 g/dL — ABNORMAL LOW (ref 12.0–15.0)
Immature Granulocytes: 0 %
Lymphocytes Relative: 37 %
Lymphs Abs: 1.3 10*3/uL (ref 0.7–4.0)
MCH: 29.1 pg (ref 26.0–34.0)
MCHC: 32.5 g/dL (ref 30.0–36.0)
MCV: 89.5 fL (ref 80.0–100.0)
Monocytes Absolute: 0.6 10*3/uL (ref 0.1–1.0)
Monocytes Relative: 16 %
Neutro Abs: 1.2 10*3/uL — ABNORMAL LOW (ref 1.7–7.7)
Neutrophils Relative %: 34 %
Platelet Count: 188 10*3/uL (ref 150–400)
RBC: 3.51 MIL/uL — ABNORMAL LOW (ref 3.87–5.11)
RDW: 14.1 % (ref 11.5–15.5)
WBC Count: 3.5 10*3/uL — ABNORMAL LOW (ref 4.0–10.5)
nRBC: 0 % (ref 0.0–0.2)

## 2024-04-13 LAB — CMP (CANCER CENTER ONLY)
ALT: 18 U/L (ref 0–44)
AST: 18 U/L (ref 15–41)
Albumin: 3.9 g/dL (ref 3.5–5.0)
Alkaline Phosphatase: 60 U/L (ref 38–126)
Anion gap: 5 (ref 5–15)
BUN: 14 mg/dL (ref 6–20)
CO2: 28 mmol/L (ref 22–32)
Calcium: 8.8 mg/dL — ABNORMAL LOW (ref 8.9–10.3)
Chloride: 107 mmol/L (ref 98–111)
Creatinine: 0.77 mg/dL (ref 0.44–1.00)
GFR, Estimated: >60 mL/min
Glucose, Bld: 97 mg/dL (ref 70–99)
Potassium: 3.5 mmol/L (ref 3.5–5.1)
Sodium: 140 mmol/L (ref 135–145)
Total Bilirubin: 0.4 mg/dL (ref 0.0–1.2)
Total Protein: 6.2 g/dL — ABNORMAL LOW (ref 6.5–8.1)

## 2024-04-13 LAB — PREGNANCY, URINE: Preg Test, Ur: NEGATIVE

## 2024-04-13 MED ORDER — DIPHENHYDRAMINE HCL 25 MG PO CAPS
25.0000 mg | ORAL_CAPSULE | Freq: Once | ORAL | Status: AC
  Administered 2024-04-13: 25 mg via ORAL
  Filled 2024-04-13: qty 1

## 2024-04-13 MED ORDER — DARATUMUMAB-HYALURONIDASE-FIHJ 1800-30000 MG-UT/15ML ~~LOC~~ SOLN
1800.0000 mg | Freq: Once | SUBCUTANEOUS | Status: AC
  Administered 2024-04-13: 1800 mg via SUBCUTANEOUS
  Filled 2024-04-13: qty 15

## 2024-04-13 MED ORDER — ACETAMINOPHEN 325 MG PO TABS
650.0000 mg | ORAL_TABLET | Freq: Once | ORAL | Status: AC
  Administered 2024-04-13: 650 mg via ORAL
  Filled 2024-04-13: qty 2

## 2024-04-13 MED ORDER — SODIUM CHLORIDE 0.9 % IV SOLN: INTRAVENOUS | Status: DC

## 2024-04-13 MED ORDER — DEXAMETHASONE 6 MG PO TABS
20.0000 mg | ORAL_TABLET | Freq: Once | ORAL | Status: AC
  Administered 2024-04-13: 20 mg via ORAL
  Filled 2024-04-13: qty 2

## 2024-04-13 MED ORDER — ZOLEDRONIC ACID 4 MG/100ML IV SOLN
4.0000 mg | Freq: Once | INTRAVENOUS | Status: AC
  Administered 2024-04-13: 4 mg via INTRAVENOUS
  Filled 2024-04-13: qty 100

## 2024-04-13 NOTE — Progress Notes (Unsigned)
 [facility] Cancer Follow up:    Kara Clotilda SAUNDERS, MD [address] [place] [address]   DIAGNOSIS: Multiple Myeloma  SUMMARY OF ONCOLOGIC HISTORY: Oncology History  Multiple myeloma (HCC)  09/12/2022 Initial Diagnosis   Multiple myeloma (HCC)   09/20/2022 - 10/04/2022 Chemotherapy   Patient is on Treatment Plan : MYELOMA NEWLY DIAGNOSED TRANSPLANT CANDIDATE DaraVRd (Daratumumab  IV) q21d x 6 Cycles (Induction/Consolidation)     10/11/2022 - 10/22/2022 Chemotherapy   Patient is on Treatment Plan : MYELOMA NON-TRANSPLANT CANDIDATES VRd SQ q21d     11/04/2022 - 03/04/2023 Chemotherapy   Patient is on Treatment Plan : MYELOMA NEWLY DIAGNOSED TRANSPLANT CANDIDATE VRd q21d starting cycle 6 (Induction/Consolidation)     07/31/2023 - 07/31/2023 Chemotherapy   Patient is on Treatment Plan : MYELOMA Daratumumab  IV q28d     08/05/2023 -  Chemotherapy   Patient is on Treatment Plan : MYELOMA NEWLY DIAGNOSED TRANSPLANT CANDIDATE Daratumumab  SQ + Lenalidomide  q28d (Maintenance)      ASBMT risk stratification: Low Risk, PR1 ECOG/KPS: 0/90% HSCT-CI score: 2 (Obesity, Age >40) Preparative regimen/treatment protocol: Melphalan 200 mg/m2   03/26/2023 - Bone Marrow Transplant  AUTOLOGOUS STEM CELL TRANSPLANT: Disease status at time of transplant: PR1 ASBMT risk stratification: low risk, ECOG/KPS: 1 / 90%, HSCT-CI score: 2 Mobilization was done with G-CSF and Mozobil with a yield of 2.104 x 10(7) CD34+ cells.  Preparative regimen with Melphalan 200 mg/m2 on 03/25/2023 (outpatient). Infusion of stem cells - 7.01 x 10(6) on 03/26/2023 (outpatient) Completed stem cell process 04/08/2023. Course minimally complicated by mucositis.   ENGRAFTMENT: WBC engraftment on 04/06/2023 and platelet engraft[facility] on 04/11/2023 at [facility]. Last platelet transfusion given 04/04/2023.  04/04/2023 - Supportive Treatment  Adult OP Blood Administration PRN x 30 DAYS Plan Provider: Dorn Dallas Ehrlich, MD   CURRENT THERAPY: DaraVRD  INTERVAL HISTORY:  Kara Medina is a 48 year old female with multiple myeloma who presents for follow-up on her treatment regimen.    She had autologous transplant on 03/26/2023.  She started post transplant revelimid on 1.30.2025  Discussed the use of AI scribe software for clinical note transcription with the patient, who gave verbal consent to proceed.  History of Present Illness   Kara Medina is a 48 year old female with multiple myeloma who presents for follow-up and medication management.  She recently visited [facility] on September 16th for a follow-up where lab work was conducted. During that visit, discussions were held regarding her medication regimen, including Zometa , which she plans to resume today. She has been on Zometa  for two years and is uncertain about the timing of a root canal, but currently, the tooth is not causing any trouble. She understands the risk of ONJ and the need to refrain from any dental procedures at least for 2-3 months from the time of zometa  administration.  Her current medication regimen includes daratumumab , which she may continue beyond two years, and Revlimid , which is also planned for a minimum of two years. She is more than one year post-transplant and has reduced her Aciclovir dosage to 400 mg twice a day. She has been taken off folic acid  supplements. She has been consistently taking baby aspirin daily. There was confusion regarding Bactrim DS, which she has not been taking.  She experiences tingling in her feet, which interferes with her daily walks. She initially attributed this to her shoes but now suspects it may be related to her condition or previous medication use,  specifically bortezomib , which she has not taken in a long time. She is considering trying nutritional supplements like PEA and capsaicin cream to alleviate her symptoms.  She has essential tremors, which have worsened with  medication. Her hands shake more when trying to hold something.  She is up to date with her vaccines, having received three on September 16th. She is also undergoing regular pregnancy tests due to Revlimid 's strict requirements, despite being premenopausal.  Rest of the pertinent 10 point ROS reviewed and negative  Patient Active Problem List   Diagnosis Date Noted   Family history of mixed hyperlipidemia 01/30/2023   Obstructive sleep apnea 01/30/2023   Port-A-Cath in place 01/06/2023   HTN (hypertension) 11/14/2022   Multiple myeloma (HCC) 09/12/2022   MGUS (monoclonal gammopathy of unknown significance) 09/10/2022   Hypertensive urgency 08/29/2022   Iron deficiency anemia 08/29/2022   LVH (left ventricular hypertrophy) 08/29/2022   Normocytic anemia 08/29/2022    is allergic to tape and amoxicillin.  MEDICAL HISTORY: Past Medical History:  Diagnosis Date   Anemia    Gestational diabetes    diet controlled   Multiple myeloma (HCC)    Postpartum care following vaginal delivery (7/27) 02/09/2016   Pregnancy induced hypertension    Trigeminal neuralgia    2012 corrected with surgery    SURGICAL HISTORY: Past Surgical History:  Procedure Laterality Date   APPENDECTOMY  2008   IR IMAGING GUIDED PORT INSERTION  12/03/2022   TRIGEMINAL NERVE DECOMPRESSION  2012    SOCIAL HISTORY: Social History   Socioeconomic History   Marital status: Married    Spouse name: Not on file   Number of children: Not on file   Years of education: Not on file   Highest education level: Bachelor's degree (e.g., BA, AB, BS)  Occupational History   Not on file  Tobacco Use   Smoking status: Never   Smokeless tobacco: Never  Vaping Use   Vaping status: Never Used  Substance and Sexual Activity   Alcohol use: No   Drug use: No   Sexual activity: Yes  Other Topics Concern   Not on file  Social History Narrative   Not on file   Social Drivers of Health   Financial Resource Strain:  Low Risk  (08/23/2022)   Overall Financial Resource Strain (CARDIA)    Difficulty of Paying Living Expenses: Not hard at all  Food Insecurity: No Food Insecurity (08/23/2022)   Hunger Vital Sign    Worried About Running Out of Food in the Last Year: Never true    Ran Out of Food in the Last Year: Never true  Transportation Needs: No Transportation Needs (08/23/2022)   PRAPARE - Administrator, Civil Service (Medical): No    Lack of Transportation (Non-Medical): No  Physical Activity: Insufficiently Active (08/23/2022)   Exercise Vital Sign    Days of Exercise per Week: 2 days    Minutes of Exercise per Session: 30 min  Stress: Stress Concern Present (08/23/2022)   [employer] of Occupational Health - Occupational Stress Questionnaire    Feeling of Stress : To some extent  Social Connections: Unknown (08/23/2022)   Social Connection and Isolation Panel    Frequency of Communication with Friends and Family: More than three times a week    Frequency of Social Gatherings with Friends and Family: Once a week    Attends Religious Services: Patient declined    Database administrator or Organizations: Yes    Attends  Engineer, structural: More than 4 times per year    Marital Status: Married  Catering manager Violence: Not on file    FAMILY HISTORY: Family History  Problem Relation Age of Onset   Cancer Other    Hypertension Other    Healthy Father    Cancer Paternal Grandfather        stomach, lung   Hypertension Mother    Breast cancer Mother 21   Hypertension Maternal Grandmother    Arthritis Maternal Grandmother    Hypertension Maternal Grandfather    Other Brother        ?prediabetic   Diabetes Paternal Grandmother    Hypertension Paternal Grandmother    Autism Son     PHYSICAL EXAMINATION     Vitals:   04/13/24 1258  BP: 125/76  Pulse: 91  Resp: 16  Temp: 98.1 F (36.7 C)  SpO2: 100%    Physical Exam Constitutional:      General: She is not  in acute distress.    Appearance: Normal appearance. She is not toxic-appearing.  HENT:     Head: Normocephalic and atraumatic.     Mouth/Throat:     Mouth: Mucous membranes are moist.     Pharynx: Oropharynx is clear. No oropharyngeal exudate or posterior oropharyngeal erythema.  Eyes:     General: No scleral icterus. Cardiovascular:     Rate and Rhythm: Normal rate and regular rhythm.     Pulses: Normal pulses.     Heart sounds: Normal heart sounds.  Pulmonary:     Effort: Pulmonary effort is normal.     Breath sounds: Normal breath sounds.  Abdominal:     General: Abdomen is flat. Bowel sounds are normal. There is no distension.     Palpations: Abdomen is soft.     Tenderness: There is no abdominal tenderness.  Musculoskeletal:        General: No swelling.     Cervical back: Neck supple.  Lymphadenopathy:     Cervical: No cervical adenopathy.  Skin:    General: Skin is warm and dry.     Findings: No rash.  Neurological:     General: No focal deficit present.     Mental Status: She is alert.  Psychiatric:        Mood and Affect: Mood normal.        Behavior: Behavior normal.     LABORATORY DATA:  CBC    Component Value Date/Time   WBC 3.4 (L) 03/16/2024 1423   RBC 3.63 (L) 03/16/2024 1423   HGB 10.6 (L) 03/16/2024 1423   HGB 9.2 (L) 02/17/2024 1319   HCT 32.4 (L) 03/16/2024 1423   HCT 24.4 (L) 09/04/2022 1037   PLT 193 03/16/2024 1423   PLT 205 02/17/2024 1319   MCV 89.3 03/16/2024 1423   MCH 29.2 03/16/2024 1423   MCHC 32.7 03/16/2024 1423   RDW 14.2 03/16/2024 1423   LYMPHSABS 1.2 03/16/2024 1423   MONOABS 0.5 03/16/2024 1423   EOSABS 0.4 03/16/2024 1423   BASOSABS 0.1 03/16/2024 1423    CMP     Component Value Date/Time   NA 140 03/16/2024 1317   K 3.5 03/16/2024 1317   CL 108 03/16/2024 1317   CO2 27 03/16/2024 1317   GLUCOSE 102 (H) 03/16/2024 1317   BUN 15 03/16/2024 1317   CREATININE 0.69 03/16/2024 1317   CREATININE 0.95 08/23/2022 1631    CALCIUM 9.1 03/16/2024 1317   PROT 6.2 (L) 03/16/2024 1317  ALBUMIN 3.9 03/16/2024 1317   AST 20 03/16/2024 1317   ALT 19 03/16/2024 1317   ALKPHOS 69 03/16/2024 1317   BILITOT 0.4 03/16/2024 1317   GFRNONAA >60 03/16/2024 1317   GFRAA >60 02/09/2016 0525    Last M protein at [facility], minute M spike to quantitate  ASSESSMENT and THERAPY PLAN:   No problem-specific Assessment & Plan notes found for this encounter.  Multiple Myeloma Ig G Kappa MM, status post daratumumab  Revlimid  and dexamethasone  induction followed by autologous stem cell transplant in September 2024. Post-transplant, PR1 with M spike reduction to 0.23 grams. MRD positive at 0.1593%. She is on maintenance with daratumumab  and Revlimid  for 2-3 yrs - Daratumumab  subcu every 28 days, Revlimid  10 mg daily from day 1 to day 21 followed by 1 week off -Continue Acyclovir  for a total of 1 year post-transplant.  Assessment and Plan Assessment & Plan  Multiple myeloma, post-autologous stem cell transplant Multiple myeloma with a small M spike, stable post-transplant. - Continue daratumumab  and Revlimid  for at least another year. - Discuss extending daratumumab  beyond two years. - Perform pregnancy test prior to Revlimid  refill due to regulations.  Peripheral neuropathy secondary to prior chemotherapy Peripheral neuropathy likely due to prior bortezomib  treatment, affecting daily activities. - Recommend PEA supplement. - Suggest capsaicin cream for relief. - Advise continuation of multivitamin .  Osteoporosis on bisphosphonate therapy Osteoporosis managed with Zometa . - Administer Zometa  infusion today. - Advise against dental procedures for 2-3 months post-infusion. - We have clearly discussed her risk of ONJ and she expressed understanding.  Acyclovir  prophylaxis, reduced dose Acyclovir  dose reduced as she is more than one year post-transplant. - Reduce acyclovir  to 400 mg twice daily.  Aspirin prophylaxis for  thrombosis prevention Aspirin prophylaxis confirmed, compliant with daily baby aspirin. - Continue daily baby aspirin.  Essential tremor Essential tremor exacerbated by certain activities.  All questions were answered. The patient knows to call the clinic with any problems, questions or concerns. We can certainly see the patient much sooner if necessary.  Total encounter time:40 minutes*in face-to-face visit time, chart review, lab review, care coordination, order entry, and documentation of the encounter time.   *Total Encounter Time as defined by the Centers for Medicare and Medicaid Services includes, in addition to the face-to-face time of a patient visit (documented in the note above) non-face-to-face time: obtaining and reviewing outside history, ordering and reviewing medications, tests or procedures, care coordination (communications with other health care professionals or caregivers) and documentation in the medical record.

## 2024-04-13 NOTE — Patient Instructions (Signed)

## 2024-04-13 NOTE — Progress Notes (Signed)
 OK to give darzalex  faspro today with ANC of 1.2 per MD, as well as Zometa ; this was on hold d/t pt needing dental procedure but this has been deferred,

## 2024-04-14 ENCOUNTER — Encounter: Payer: Self-pay | Admitting: Hematology and Oncology

## 2024-04-14 ENCOUNTER — Other Ambulatory Visit: Payer: Self-pay

## 2024-04-14 LAB — PROTEIN ELECTROPHORESIS, SERUM
A/G Ratio: 1.3 (ref 0.7–1.7)
Albumin ELP: 3.3 g/dL (ref 2.9–4.4)
Alpha-1-Globulin: 0.2 g/dL (ref 0.0–0.4)
Alpha-2-Globulin: 0.7 g/dL (ref 0.4–1.0)
Beta Globulin: 1 g/dL (ref 0.7–1.3)
Gamma Globulin: 0.7 g/dL (ref 0.4–1.8)
Globulin, Total: 2.6 g/dL (ref 2.2–3.9)
M-Spike, %: 0.4 g/dL — ABNORMAL HIGH
Total Protein ELP: 5.9 g/dL — ABNORMAL LOW (ref 6.0–8.5)

## 2024-04-15 ENCOUNTER — Other Ambulatory Visit

## 2024-04-15 ENCOUNTER — Ambulatory Visit: Admitting: Hematology and Oncology

## 2024-04-16 ENCOUNTER — Other Ambulatory Visit: Payer: Self-pay

## 2024-04-19 ENCOUNTER — Other Ambulatory Visit: Payer: Self-pay | Admitting: *Deleted

## 2024-04-19 ENCOUNTER — Encounter: Payer: Self-pay | Admitting: Hematology and Oncology

## 2024-04-19 DIAGNOSIS — C9000 Multiple myeloma not having achieved remission: Secondary | ICD-10-CM

## 2024-04-19 DIAGNOSIS — D472 Monoclonal gammopathy: Secondary | ICD-10-CM

## 2024-04-19 MED ORDER — LENALIDOMIDE 10 MG PO CAPS: 10.0000 mg | ORAL_CAPSULE | Freq: Every day | ORAL | 0 refills | Status: DC

## 2024-04-21 LAB — IFE, DARA-SPECIFIC, SERUM
IgA: 202 mg/dL (ref 87–352)
IgG (Immunoglobin G), Serum: 792 mg/dL (ref 586–1602)
IgM (Immunoglobulin M), Srm: 35 mg/dL (ref 26–217)

## 2024-04-23 ENCOUNTER — Other Ambulatory Visit: Payer: Self-pay

## 2024-04-23 ENCOUNTER — Other Ambulatory Visit: Payer: Self-pay | Admitting: *Deleted

## 2024-04-23 ENCOUNTER — Inpatient Hospital Stay: Attending: Hematology and Oncology

## 2024-04-23 DIAGNOSIS — Z5112 Encounter for antineoplastic immunotherapy: Secondary | ICD-10-CM | POA: Insufficient documentation

## 2024-04-23 DIAGNOSIS — C9000 Multiple myeloma not having achieved remission: Secondary | ICD-10-CM

## 2024-04-23 DIAGNOSIS — D472 Monoclonal gammopathy: Secondary | ICD-10-CM

## 2024-04-23 DIAGNOSIS — C9002 Multiple myeloma in relapse: Secondary | ICD-10-CM

## 2024-04-23 DIAGNOSIS — Z79620 Long term (current) use of immunosuppressive biologic: Secondary | ICD-10-CM | POA: Insufficient documentation

## 2024-04-23 LAB — PREGNANCY, URINE: Preg Test, Ur: NEGATIVE

## 2024-04-23 MED ORDER — LENALIDOMIDE 10 MG PO CAPS: 10.0000 mg | ORAL_CAPSULE | Freq: Every day | ORAL | 0 refills | Status: DC

## 2024-04-26 ENCOUNTER — Other Ambulatory Visit: Payer: Self-pay | Admitting: Family Medicine

## 2024-04-26 DIAGNOSIS — Z1231 Encounter for screening mammogram for malignant neoplasm of breast: Secondary | ICD-10-CM

## 2024-05-05 ENCOUNTER — Ambulatory Visit
Admission: RE | Admit: 2024-05-05 | Discharge: 2024-05-05 | Disposition: A | Discharge status: Home / Self Care | Source: Ambulatory Visit

## 2024-05-05 DIAGNOSIS — Z1231 Encounter for screening mammogram for malignant neoplasm of breast: Secondary | ICD-10-CM

## 2024-05-06 ENCOUNTER — Encounter: Admitting: Family Medicine

## 2024-05-11 ENCOUNTER — Other Ambulatory Visit: Payer: Self-pay

## 2024-05-11 ENCOUNTER — Inpatient Hospital Stay

## 2024-05-11 ENCOUNTER — Inpatient Hospital Stay (HOSPITAL_BASED_OUTPATIENT_CLINIC_OR_DEPARTMENT_OTHER): Admitting: Hematology and Oncology

## 2024-05-11 VITALS — BP 126/75 | HR 94 | Temp 97.6°F | Resp 16 | Wt 233.0 lb

## 2024-05-11 DIAGNOSIS — Z5112 Encounter for antineoplastic immunotherapy: Secondary | ICD-10-CM | POA: Diagnosis not present

## 2024-05-11 DIAGNOSIS — D472 Monoclonal gammopathy: Secondary | ICD-10-CM

## 2024-05-11 DIAGNOSIS — C9001 Multiple myeloma in remission: Secondary | ICD-10-CM

## 2024-05-11 DIAGNOSIS — C9000 Multiple myeloma not having achieved remission: Secondary | ICD-10-CM

## 2024-05-11 DIAGNOSIS — D649 Anemia, unspecified: Secondary | ICD-10-CM

## 2024-05-11 LAB — CBC WITH DIFFERENTIAL (CANCER CENTER ONLY)
Abs Immature Granulocytes: 0.01 K/uL (ref 0.00–0.07)
Basophils Absolute: 0.1 K/uL (ref 0.0–0.1)
Basophils Relative: 2 %
Eosinophils Absolute: 0.2 K/uL (ref 0.0–0.5)
Eosinophils Relative: 7 %
HCT: 32.7 % — ABNORMAL LOW (ref 36.0–46.0)
Hemoglobin: 10.8 g/dL — ABNORMAL LOW (ref 12.0–15.0)
Immature Granulocytes: 0 %
Lymphocytes Relative: 32 %
Lymphs Abs: 1.1 K/uL (ref 0.7–4.0)
MCH: 29.7 pg (ref 26.0–34.0)
MCHC: 33 g/dL (ref 30.0–36.0)
MCV: 89.8 fL (ref 80.0–100.0)
Monocytes Absolute: 0.6 K/uL (ref 0.1–1.0)
Monocytes Relative: 17 %
Neutro Abs: 1.4 K/uL — ABNORMAL LOW (ref 1.7–7.7)
Neutrophils Relative %: 42 %
Platelet Count: 175 K/uL (ref 150–400)
RBC: 3.64 MIL/uL — ABNORMAL LOW (ref 3.87–5.11)
RDW: 13.4 % (ref 11.5–15.5)
WBC Count: 3.3 K/uL — ABNORMAL LOW (ref 4.0–10.5)
nRBC: 0 % (ref 0.0–0.2)

## 2024-05-11 LAB — CMP (CANCER CENTER ONLY)
ALT: 15 U/L (ref 0–44)
AST: 16 U/L (ref 15–41)
Albumin: 4 g/dL (ref 3.5–5.0)
Alkaline Phosphatase: 56 U/L (ref 38–126)
Anion gap: 4 — ABNORMAL LOW (ref 5–15)
BUN: 13 mg/dL (ref 6–20)
CO2: 29 mmol/L (ref 22–32)
Calcium: 8.8 mg/dL — ABNORMAL LOW (ref 8.9–10.3)
Chloride: 107 mmol/L (ref 98–111)
Creatinine: 0.8 mg/dL (ref 0.44–1.00)
GFR, Estimated: >60 mL/min (ref >60)
Glucose, Bld: 90 mg/dL (ref 70–99)
Potassium: 3.9 mmol/L (ref 3.5–5.1)
Sodium: 140 mmol/L (ref 135–145)
Total Bilirubin: 0.5 mg/dL (ref 0.0–1.2)
Total Protein: 6.3 g/dL — ABNORMAL LOW (ref 6.5–8.1)

## 2024-05-11 LAB — PREGNANCY, URINE: Preg Test, Ur: NEGATIVE

## 2024-05-11 MED ORDER — DARATUMUMAB-HYALURONIDASE-FIHJ 1800-30000 MG-UT/15ML ~~LOC~~ SOLN
1800.0000 mg | Freq: Once | SUBCUTANEOUS | Status: AC
  Administered 2024-05-11: 1800 mg via SUBCUTANEOUS
  Filled 2024-05-11: qty 15

## 2024-05-11 MED ORDER — DIPHENHYDRAMINE HCL 25 MG PO CAPS
25.0000 mg | ORAL_CAPSULE | Freq: Once | ORAL | Status: AC
  Administered 2024-05-11: 25 mg via ORAL
  Filled 2024-05-11: qty 1

## 2024-05-11 MED ORDER — ACETAMINOPHEN 325 MG PO TABS
650.0000 mg | ORAL_TABLET | Freq: Once | ORAL | Status: AC
  Administered 2024-05-11: 650 mg via ORAL
  Filled 2024-05-11: qty 2

## 2024-05-11 MED ORDER — DEXAMETHASONE 6 MG PO TABS
20.0000 mg | ORAL_TABLET | Freq: Once | ORAL | Status: AC
  Administered 2024-05-11: 20 mg via ORAL
  Filled 2024-05-11: qty 2

## 2024-05-11 NOTE — Patient Instructions (Signed)

## 2024-05-11 NOTE — Progress Notes (Signed)
 [facility] Cancer Follow up:    Kara Clotilda SAUNDERS, MD [address] [place] [address]   DIAGNOSIS: Multiple Myeloma  SUMMARY OF ONCOLOGIC HISTORY: Oncology History  Multiple myeloma (HCC)  09/12/2022 Initial Diagnosis   Multiple myeloma (HCC)   09/20/2022 - 10/04/2022 Chemotherapy   Patient is on Treatment Plan : MYELOMA NEWLY DIAGNOSED TRANSPLANT CANDIDATE DaraVRd (Daratumumab  IV) q21d x 6 Cycles (Induction/Consolidation)     10/11/2022 - 10/22/2022 Chemotherapy   Patient is on Treatment Plan : MYELOMA NON-TRANSPLANT CANDIDATES VRd SQ q21d     11/04/2022 - 03/04/2023 Chemotherapy   Patient is on Treatment Plan : MYELOMA NEWLY DIAGNOSED TRANSPLANT CANDIDATE VRd q21d starting cycle 6 (Induction/Consolidation)     07/31/2023 - 07/31/2023 Chemotherapy   Patient is on Treatment Plan : MYELOMA Daratumumab  IV q28d     08/05/2023 -  Chemotherapy   Patient is on Treatment Plan : MYELOMA NEWLY DIAGNOSED TRANSPLANT CANDIDATE Daratumumab  SQ + Lenalidomide  q28d (Maintenance)      ASBMT risk stratification: Low Risk, PR1 ECOG/KPS: 0/90% HSCT-CI score: 2 (Obesity, Age >40) Preparative regimen/treatment protocol: Melphalan 200 mg/m2   03/26/2023 - Bone Marrow Transplant  AUTOLOGOUS STEM CELL TRANSPLANT: Disease status at time of transplant: PR1 ASBMT risk stratification: low risk, ECOG/KPS: 1 / 90%, HSCT-CI score: 2 Mobilization was done with G-CSF and Mozobil with a yield of 2.104 x 10(7) CD34+ cells.  Preparative regimen with Melphalan 200 mg/m2 on 03/25/2023 (outpatient). Infusion of stem cells - 7.01 x 10(6) on 03/26/2023 (outpatient) Completed stem cell process 04/08/2023. Course minimally complicated by mucositis.   ENGRAFTMENT: WBC engraftment on 04/06/2023 and platelet engraftment on 04/11/2023 at [facility]. Last platelet transfusion given 04/04/2023.  04/04/2023 - Supportive Treatment  Adult OP Blood Administration PRN x 30 DAYS Plan Provider: Dorn Dallas Ehrlich, MD   CURRENT THERAPY: DaraVRD  INTERVAL HISTORY:  Kara Medina is a 48 year old female with multiple myeloma who presents for follow-up on her treatment regimen.    She had autologous transplant on 03/26/2023.  She started post transplant revelimid on 1.30.2025  Discussed the use of AI scribe software for clinical note transcription with the patient, who gave verbal consent to proceed.  History of Present Illness  Kara Medina is a 48 year old female with multiple myeloma who presents for routine follow-up.  There have been no significant changes in her condition since the last visit. Her M protein levels have shown slight variation, with the most recent measurement at 0.4 g/dl.  She is currently on Revlimid  and daratumumab  as part of her treatment regimen and has not reported any new health issues. She received Zometa  during her last visit, which is scheduled every three months.   She has been compliant with the required pregnancy tests, having taken one on October 10th and another today. She finds the frequency of these tests somewhat frustrating but understands their necessity.  Rest of the pertinent 10 point ROS reviewed and negative  Patient Active Problem List   Diagnosis Date Noted   Family history of mixed hyperlipidemia 01/30/2023   Obstructive sleep apnea 01/30/2023   Port-A-Cath in place 01/06/2023   HTN (hypertension) 11/14/2022   Multiple myeloma (HCC) 09/12/2022   MGUS (monoclonal gammopathy of unknown significance) 09/10/2022   Hypertensive urgency 08/29/2022   Iron deficiency anemia 08/29/2022   LVH (left ventricular hypertrophy) 08/29/2022   Normocytic anemia 08/29/2022    is allergic to chlorhexidine, tape, amoxicillin, and morphine sulfate.  MEDICAL HISTORY:  Past Medical History:  Diagnosis Date   Anemia    Gestational diabetes    diet controlled   Multiple myeloma (HCC)    Postpartum care following vaginal delivery (7/27)  02/09/2016   Pregnancy induced hypertension    Trigeminal neuralgia    2012 corrected with surgery    SURGICAL HISTORY: Past Surgical History:  Procedure Laterality Date   APPENDECTOMY  2008   IR IMAGING GUIDED PORT INSERTION  12/03/2022   TRIGEMINAL NERVE DECOMPRESSION  2012    SOCIAL HISTORY: Social History   Socioeconomic History   Marital status: Married    Spouse name: Not on file   Number of children: Not on file   Years of education: Not on file   Highest education level: Bachelor's degree (e.g., BA, AB, BS)  Occupational History   Not on file  Tobacco Use   Smoking status: Never   Smokeless tobacco: Never  Vaping Use   Vaping status: Never Used  Substance and Sexual Activity   Alcohol use: No   Drug use: No   Sexual activity: Yes  Other Topics Concern   Not on file  Social History Narrative   Not on file   Social Drivers of Health   Financial Resource Strain: Low Risk  (05/02/2024)   Overall Financial Resource Strain (CARDIA)    Difficulty of Paying Living Expenses: Not hard at all  Food Insecurity: No Food Insecurity (05/02/2024)   Hunger Vital Sign    Worried About Running Out of Food in the Last Year: Never true    Ran Out of Food in the Last Year: Never true  Transportation Needs: No Transportation Needs (05/02/2024)   PRAPARE - Administrator, Civil Service (Medical): No    Lack of Transportation (Non-Medical): No  Physical Activity: Insufficiently Active (05/02/2024)   Exercise Vital Sign    Days of Exercise per Week: 3 days    Minutes of Exercise per Session: 30 min  Stress: No Stress Concern Present (05/02/2024)   [employer] of Occupational Health - Occupational Stress Questionnaire    Feeling of Stress: Not at all  Social Connections: Moderately Isolated (05/02/2024)   Social Connection and Isolation Panel    Frequency of Communication with Friends and Family: More than three times a week    Frequency of Social Gatherings  with Friends and Family: Once a week    Attends Religious Services: Never    Database Administrator or Organizations: No    Attends Engineer, Structural: Not on file    Marital Status: Married  Catering Manager Violence: Not on file    FAMILY HISTORY: Family History  Problem Relation Age of Onset   Cancer Other    Hypertension Other    Healthy Father    Cancer Paternal Grandfather        stomach, lung   Hypertension Mother    Breast cancer Mother 36   Hypertension Maternal Grandmother    Arthritis Maternal Grandmother    Hypertension Maternal Grandfather    Other Brother        ?prediabetic   Diabetes Paternal Grandmother    Hypertension Paternal Grandmother    Autism Son     PHYSICAL EXAMINATION     Vitals:   05/11/24 1125  BP: 126/75  Pulse: 94  Resp: 16  Temp: 97.6 F (36.4 C)  SpO2: 100%    Physical Exam Constitutional:      General: She is not in acute distress.  Appearance: Normal appearance. She is not toxic-appearing.  HENT:     Head: Normocephalic and atraumatic.     Mouth/Throat:     Mouth: Mucous membranes are moist.     Pharynx: Oropharynx is clear. No oropharyngeal exudate or posterior oropharyngeal erythema.  Eyes:     General: No scleral icterus. Cardiovascular:     Rate and Rhythm: Normal rate and regular rhythm.     Pulses: Normal pulses.     Heart sounds: Normal heart sounds.  Pulmonary:     Effort: Pulmonary effort is normal.     Breath sounds: Normal breath sounds.  Abdominal:     General: Abdomen is flat. Bowel sounds are normal. There is no distension.     Palpations: Abdomen is soft.     Tenderness: There is no abdominal tenderness.  Musculoskeletal:        General: No swelling.     Cervical back: Neck supple.  Lymphadenopathy:     Cervical: No cervical adenopathy.  Skin:    General: Skin is warm and dry.     Findings: No rash.  Neurological:     General: No focal deficit present.     Mental Status: She is  alert.  Psychiatric:        Mood and Affect: Mood normal.        Behavior: Behavior normal.     LABORATORY DATA:  CBC    Component Value Date/Time   WBC 3.5 (L) 04/13/2024 1216   WBC 3.4 (L) 03/16/2024 1423   RBC 3.51 (L) 04/13/2024 1216   HGB 10.2 (L) 04/13/2024 1216   HCT 31.4 (L) 04/13/2024 1216   HCT 24.4 (L) 09/04/2022 1037   PLT 188 04/13/2024 1216   MCV 89.5 04/13/2024 1216   MCH 29.1 04/13/2024 1216   MCHC 32.5 04/13/2024 1216   RDW 14.1 04/13/2024 1216   LYMPHSABS 1.3 04/13/2024 1216   MONOABS 0.6 04/13/2024 1216   EOSABS 0.4 04/13/2024 1216   BASOSABS 0.1 04/13/2024 1216    CMP     Component Value Date/Time   NA 140 04/13/2024 1216   K 3.5 04/13/2024 1216   CL 107 04/13/2024 1216   CO2 28 04/13/2024 1216   GLUCOSE 97 04/13/2024 1216   BUN 14 04/13/2024 1216   CREATININE 0.77 04/13/2024 1216   CREATININE 0.95 08/23/2022 1631   CALCIUM 8.8 (L) 04/13/2024 1216   PROT 6.2 (L) 04/13/2024 1216   ALBUMIN 3.9 04/13/2024 1216   AST 18 04/13/2024 1216   ALT 18 04/13/2024 1216   ALKPHOS 60 04/13/2024 1216   BILITOT 0.4 04/13/2024 1216   GFRNONAA >60 04/13/2024 1216   GFRAA >60 02/09/2016 0525    Last M protein at [facility], minute M spike to quantitate  ASSESSMENT and THERAPY PLAN:   No problem-specific Assessment & Plan notes found for this encounter.  Multiple Myeloma Ig G Kappa MM, status post daratumumab  Revlimid  and dexamethasone  induction followed by autologous stem cell transplant in September 2024. Post-transplant, PR1 with M spike reduction to 0.23 grams. MRD positive at 0.1593%. She is on maintenance with daratumumab  and Revlimid  for 2-3 yrs - Daratumumab  subcu every 28 days, Revlimid  10 mg daily from day 1 to day 21 followed by 1 week off -Continue Acyclovir  for a total of 1 year post-transplant. Assessment & Plan  Multiple myeloma not having achieved remission Multiple myeloma stable with slight M protein variation. Blood counts stable, kidney  function normal, low myeloma activity. - Continue daratumumab  and Revlimid  with  zometa  support - Proceed with scheduled treatments. - Monitor M protein levels and blood counts. - Schedule Zometa  infusion for December.  Neutropenia Neutropenia present, mild Ok to proceed with treatment as scheduled.  All questions were answered. The patient knows to call the clinic with any problems, questions or concerns. We can certainly see the patient much sooner if necessary.  Total encounter time:30 minutes*in face-to-face visit time, chart review, lab review, care coordination, order entry, and documentation of the encounter time.   *Total Encounter Time as defined by the Centers for Medicare and Medicaid Services includes, in addition to the face-to-face time of a patient visit (documented in the note above) non-face-to-face time: obtaining and reviewing outside history, ordering and reviewing medications, tests or procedures, care coordination (communications with other health care professionals or caregivers) and documentation in the medical record.

## 2024-05-12 ENCOUNTER — Encounter: Payer: Self-pay | Admitting: Hematology and Oncology

## 2024-05-12 LAB — BETA 2 MICROGLOBULIN, SERUM: Beta-2 Microglobulin: 1.4 mg/L (ref 0.6–2.4)

## 2024-05-12 LAB — KAPPA/LAMBDA LIGHT CHAINS
Kappa free light chain: 17.8 mg/L (ref 3.3–19.4)
Kappa, lambda light chain ratio: 2.66 — ABNORMAL HIGH (ref 0.26–1.65)
Lambda free light chains: 6.7 mg/L (ref 5.7–26.3)

## 2024-05-13 ENCOUNTER — Telehealth: Payer: Self-pay

## 2024-05-13 ENCOUNTER — Encounter: Payer: Self-pay | Admitting: Hematology and Oncology

## 2024-05-13 LAB — PROTEIN ELECTROPHORESIS, SERUM
A/G Ratio: 1.3 (ref 0.7–1.7)
Albumin ELP: 3.5 g/dL (ref 2.9–4.4)
Alpha-1-Globulin: 0.2 g/dL (ref 0.0–0.4)
Alpha-2-Globulin: 0.8 g/dL (ref 0.4–1.0)
Beta Globulin: 1 g/dL (ref 0.7–1.3)
Gamma Globulin: 0.7 g/dL (ref 0.4–1.8)
Globulin, Total: 2.7 g/dL (ref 2.2–3.9)
M-Spike, %: 0.3 g/dL — ABNORMAL HIGH
Total Protein ELP: 6.2 g/dL (ref 6.0–8.5)

## 2024-05-13 NOTE — Telephone Encounter (Signed)
 Attempted to contact pt to find out when her last Lenalidomide  will be, as [facility] is not allowing an auth yet. Her VM box is full. Will f/u next week.

## 2024-05-14 ENCOUNTER — Other Ambulatory Visit: Payer: Self-pay | Admitting: *Deleted

## 2024-05-14 ENCOUNTER — Encounter: Payer: Self-pay | Admitting: Hematology and Oncology

## 2024-05-17 ENCOUNTER — Other Ambulatory Visit: Payer: Self-pay | Admitting: Hematology and Oncology

## 2024-05-17 ENCOUNTER — Inpatient Hospital Stay: Attending: Hematology and Oncology

## 2024-05-17 DIAGNOSIS — Z79620 Long term (current) use of immunosuppressive biologic: Secondary | ICD-10-CM | POA: Diagnosis not present

## 2024-05-17 DIAGNOSIS — D472 Monoclonal gammopathy: Secondary | ICD-10-CM

## 2024-05-17 DIAGNOSIS — C9000 Multiple myeloma not having achieved remission: Secondary | ICD-10-CM

## 2024-05-17 DIAGNOSIS — Z5112 Encounter for antineoplastic immunotherapy: Secondary | ICD-10-CM | POA: Insufficient documentation

## 2024-05-17 LAB — IFE, DARA-SPECIFIC, SERUM
IgA: 195 mg/dL (ref 87–352)
IgG (Immunoglobin G), Serum: 698 mg/dL (ref 586–1602)
IgM (Immunoglobulin M), Srm: 41 mg/dL (ref 26–217)

## 2024-05-17 LAB — PREGNANCY, URINE: Preg Test, Ur: NEGATIVE

## 2024-05-25 ENCOUNTER — Other Ambulatory Visit: Payer: Self-pay

## 2024-05-25 ENCOUNTER — Encounter: Payer: Self-pay | Admitting: Hematology and Oncology

## 2024-05-25 DIAGNOSIS — C9000 Multiple myeloma not having achieved remission: Secondary | ICD-10-CM

## 2024-05-25 DIAGNOSIS — D472 Monoclonal gammopathy: Secondary | ICD-10-CM

## 2024-05-25 MED ORDER — LENALIDOMIDE 10 MG PO CAPS: 10.0000 mg | ORAL_CAPSULE | Freq: Every day | ORAL | 0 refills | Status: DC

## 2024-05-27 ENCOUNTER — Ambulatory Visit: Admitting: Family Medicine

## 2024-05-27 ENCOUNTER — Encounter: Payer: Self-pay | Admitting: Family Medicine

## 2024-05-27 VITALS — BP 120/78 | HR 94 | Temp 98.5°F | Ht 66.0 in | Wt 234.4 lb

## 2024-05-27 DIAGNOSIS — R251 Tremor, unspecified: Secondary | ICD-10-CM

## 2024-05-27 DIAGNOSIS — Z0000 Encounter for general adult medical examination without abnormal findings: Secondary | ICD-10-CM

## 2024-05-27 DIAGNOSIS — C9000 Multiple myeloma not having achieved remission: Secondary | ICD-10-CM

## 2024-05-27 NOTE — Progress Notes (Signed)
 Established Patient Office Visit   Subjective  Patient ID: Kara Medina, female    DOB: 11-25-1975  Age: 48 y.o. MRN: [id number]  Chief Complaint  Patient presents with   Annual Exam    PCOS questions     Pt is a 48 yo female seen for CPE.  She is currently undergoing treatment for multiple myeloma with Revlimid , taken for 21 days followed by a 7-day break, and daratumumab , administered as a monthly injection. She feels 'pretty good' and has no specific complaints at this time.  Monthly laboratory work, including CBC, is conducted at the cancer center as part of her ongoing treatment.  There is no family history of multiple myeloma, although her mother and aunt are breast cancer survivors.    Patient Active Problem List   Diagnosis Date Noted   Family history of mixed hyperlipidemia 01/30/2023   Obstructive sleep apnea 01/30/2023   Port-A-Cath in place 01/06/2023   HTN (hypertension) 11/14/2022   Multiple myeloma (HCC) 09/12/2022   MGUS (monoclonal gammopathy of unknown significance) 09/10/2022   Hypertensive urgency 08/29/2022   Iron deficiency anemia 08/29/2022   LVH (left ventricular hypertrophy) 08/29/2022   Normocytic anemia 08/29/2022   Past Medical History:  Diagnosis Date   Anemia 08/23/2022   Gestational diabetes    diet controlled   Multiple myeloma (HCC)    Postpartum care following vaginal delivery (7/27) 02/09/2016   Pregnancy induced hypertension    Trigeminal neuralgia    2012 corrected with surgery   Past Surgical History:  Procedure Laterality Date   APPENDECTOMY  2008   BRAIN SURGERY  2012   Trigeminal Neuralgia surgery   IR IMAGING GUIDED PORT INSERTION  12/03/2022   TRIGEMINAL NERVE DECOMPRESSION  2012   Social History   Tobacco Use   Smoking status: Never   Smokeless tobacco: Never  Vaping Use   Vaping status: Never Used  Substance Use Topics   Alcohol use: No   Drug use: No   Family History  Problem Relation Age of Onset    Cancer Other    Hypertension Other    Cancer Father    Cancer Paternal Grandfather        stomach, lung   Hypertension Mother    Breast cancer Mother 44   Cancer Mother    Hypertension Maternal Grandmother    Arthritis Maternal Grandmother    Stroke Maternal Grandmother    Hypertension Maternal Grandfather    Other Brother        ?prediabetic   Diabetes Paternal Grandmother    Hypertension Paternal Grandmother    Autism Son    Allergies  Allergen Reactions   Chlorhexidine Other (See Comments)    Cause skin irritation .Please use ALCOHOL swab sticks.   Tape Rash    Transparent dressing causes severe itching and a rash - please use Mepilex dressing   Amoxicillin Hives    Has patient had a PCN reaction causing immediate rash, facial/tongue/throat swelling, SOB or lightheadedness with hypotension: Yes Has patient had a PCN reaction causing severe rash involving mucus membranes or skin necrosis: No Has patient had a PCN reaction that required hospitalization No Has patient had a PCN reaction occurring within the last 10 years: No If all of the above answers are NO, then may proceed with Cephalosporin use.    Morphine Sulfate Other (See Comments) and Nausea And Vomiting    ROS Negative unless stated above    Objective:     BP 120/78 (BP  Location: Left Arm, Patient Position: Sitting, Cuff Size: Large)   Pulse 94   Temp 98.5 F (36.9 C) (Oral)   Ht 5' 6 (1.676 m)   Wt 234 lb 6.4 oz (106.3 kg)   LMP  (LMP Unknown)   SpO2 99%   BMI 37.83 kg/m  BP Readings from Last 3 Encounters:  05/27/24 120/78  05/11/24 126/75  04/13/24 125/76   Wt Readings from Last 3 Encounters:  05/27/24 234 lb 6.4 oz (106.3 kg)  05/11/24 233 lb (105.7 kg)  04/13/24 237 lb 3.2 oz (107.6 kg)      Physical Exam Constitutional:      Appearance: Normal appearance.  HENT:     Head: Normocephalic and atraumatic.     Right Ear: Tympanic membrane, ear canal and external ear normal.     Left  Ear: Tympanic membrane, ear canal and external ear normal.     Nose: Nose normal.     Mouth/Throat:     Mouth: Mucous membranes are moist.     Pharynx: No oropharyngeal exudate or posterior oropharyngeal erythema.  Eyes:     General: No scleral icterus.    Extraocular Movements: Extraocular movements intact.     Conjunctiva/sclera: Conjunctivae normal.     Pupils: Pupils are equal, round, and reactive to light.  Neck:     Thyroid: No thyromegaly.     Vascular: No carotid bruit.  Cardiovascular:     Rate and Rhythm: Normal rate and regular rhythm.     Pulses: Normal pulses.     Heart sounds: Normal heart sounds. No murmur heard.    No friction rub.  Pulmonary:     Effort: Pulmonary effort is normal.     Breath sounds: Normal breath sounds. No wheezing, rhonchi or rales.  Abdominal:     General: Bowel sounds are normal.     Palpations: Abdomen is soft.     Tenderness: There is no abdominal tenderness.  Musculoskeletal:        General: No deformity. Normal range of motion.  Lymphadenopathy:     Cervical: No cervical adenopathy.  Skin:    General: Skin is warm and dry.     Findings: No lesion.  Neurological:     General: No focal deficit present.     Mental Status: She is alert and oriented to person, place, and time.  Psychiatric:        Mood and Affect: Mood normal.        Thought Content: Thought content normal.        05/27/2024    4:25 PM 05/11/2024   11:00 AM 04/13/2024    1:00 PM  Depression screen PHQ 2/9  Decreased Interest 0 0 0  Down, Depressed, Hopeless 0 0 0  PHQ - 2 Score 0 0 0  Altered sleeping 0    Tired, decreased energy 0    Change in appetite 0    Feeling bad or failure about yourself  0    Trouble concentrating 0    Moving slowly or fidgety/restless 0    Suicidal thoughts 0    PHQ-9 Score 0        05/27/2024    4:25 PM  GAD 7 : Generalized Anxiety Score  Nervous, Anxious, on Edge 0  Control/stop worrying 0  Worry too much - different  things 0  Trouble relaxing 0  Restless 0  Easily annoyed or irritable 0  Afraid - awful might happen 0  Total GAD 7 Score  0     No results found for any visits on 05/27/24.    Assessment & Plan:   Well adult exam -     Hemoglobin A1c; Future -     Lipid panel; Future -     T4, free; Future -     TSH; Future -     Vitamin B12; Future -     VITAMIN D  25 Hydroxy (Vit-D Deficiency, Fractures); Future -     Folate; Future  Tremor -     T4, free; Future -     TSH; Future -     Vitamin B12; Future -     VITAMIN D  25 Hydroxy (Vit-D Deficiency, Fractures); Future -     Folate; Future  Multiple myeloma not having achieved remission [facility])  Age appropriate health screenings discussed.  Obtain labs.  Not fasting.  Continue f/u with Heme/Onc for MM and MGUS.  No currently in remission.  Slight tremor likely from Revlimid  and daratumumab .   Check for other causes.  Immunizations reviewed.  Cologuard normal 09/08/21, repeat in 2026. Mammogram done 05/05/24.    Return in about 6 months (around 11/24/2024).   Clotilda JONELLE Single, MD

## 2024-05-27 NOTE — Patient Instructions (Signed)
 Lyle Mace, MD [facility] [place]   Delon Lenis, DO [facility] health dermatology  Jerel Kuba Swim instructor [phone] [email or web address]

## 2024-05-28 ENCOUNTER — Other Ambulatory Visit: Payer: Self-pay

## 2024-05-28 LAB — LIPID PANEL
Cholesterol: 122 mg/dL (ref 0–200)
HDL: 35.2 mg/dL — ABNORMAL LOW (ref >39.00)
LDL Cholesterol: 74 mg/dL (ref 0–99)
NonHDL: 86.9
Total CHOL/HDL Ratio: 3
Triglycerides: 63 mg/dL (ref 0.0–149.0)
VLDL: 12.6 mg/dL (ref 0.0–40.0)

## 2024-05-28 LAB — VITAMIN B12: Vitamin B-12: 312 pg/mL (ref 211–911)

## 2024-05-28 LAB — FOLATE: Folate: 20.4 ng/mL (ref >5.9)

## 2024-05-28 LAB — TSH: TSH: 0.82 u[IU]/mL (ref 0.35–5.50)

## 2024-05-28 LAB — VITAMIN D 25 HYDROXY (VIT D DEFICIENCY, FRACTURES): VITD: 23.67 ng/mL — ABNORMAL LOW (ref 30.00–100.00)

## 2024-05-28 LAB — T4, FREE: Free T4: 0.91 ng/dL (ref 0.60–1.60)

## 2024-05-31 LAB — HEMOGLOBIN A1C: Hgb A1c MFr Bld: 4.8 % (ref 4.6–6.5)

## 2024-06-02 ENCOUNTER — Ambulatory Visit: Payer: Self-pay | Admitting: Family Medicine

## 2024-06-02 DIAGNOSIS — E559 Vitamin D deficiency, unspecified: Secondary | ICD-10-CM

## 2024-06-02 MED ORDER — VITAMIN D (ERGOCALCIFEROL) 1.25 MG (50000 UNIT) PO CAPS: 50000.0000 [IU] | ORAL_CAPSULE | ORAL | 0 refills | Status: AC

## 2024-06-03 ENCOUNTER — Other Ambulatory Visit: Payer: Self-pay | Admitting: *Deleted

## 2024-06-03 DIAGNOSIS — C9000 Multiple myeloma not having achieved remission: Secondary | ICD-10-CM

## 2024-06-08 ENCOUNTER — Inpatient Hospital Stay (HOSPITAL_BASED_OUTPATIENT_CLINIC_OR_DEPARTMENT_OTHER): Admitting: Hematology and Oncology

## 2024-06-08 ENCOUNTER — Inpatient Hospital Stay

## 2024-06-08 ENCOUNTER — Encounter: Payer: Self-pay | Admitting: Hematology and Oncology

## 2024-06-08 VITALS — BP 136/82 | HR 96 | Temp 98.3°F | Resp 16 | Wt 229.4 lb

## 2024-06-08 DIAGNOSIS — D649 Anemia, unspecified: Secondary | ICD-10-CM

## 2024-06-08 DIAGNOSIS — Z5112 Encounter for antineoplastic immunotherapy: Secondary | ICD-10-CM | POA: Diagnosis not present

## 2024-06-08 DIAGNOSIS — D472 Monoclonal gammopathy: Secondary | ICD-10-CM

## 2024-06-08 DIAGNOSIS — C9000 Multiple myeloma not having achieved remission: Secondary | ICD-10-CM

## 2024-06-08 DIAGNOSIS — C9001 Multiple myeloma in remission: Secondary | ICD-10-CM

## 2024-06-08 LAB — CMP (CANCER CENTER ONLY)
ALT: 17 U/L (ref 0–44)
AST: 23 U/L (ref 15–41)
Albumin: 4.3 g/dL (ref 3.5–5.0)
Alkaline Phosphatase: 56 U/L (ref 38–126)
Anion gap: 9 (ref 5–15)
BUN: 15 mg/dL (ref 6–20)
CO2: 26 mmol/L (ref 22–32)
Calcium: 9.1 mg/dL (ref 8.9–10.3)
Chloride: 106 mmol/L (ref 98–111)
Creatinine: 0.75 mg/dL (ref 0.44–1.00)
GFR, Estimated: >60 mL/min (ref >60)
Glucose, Bld: 114 mg/dL — ABNORMAL HIGH (ref 70–99)
Potassium: 3.7 mmol/L (ref 3.5–5.1)
Sodium: 140 mmol/L (ref 135–145)
Total Bilirubin: 0.6 mg/dL (ref 0.0–1.2)
Total Protein: 6.7 g/dL (ref 6.5–8.1)

## 2024-06-08 LAB — CBC WITH DIFFERENTIAL (CANCER CENTER ONLY)
Abs Immature Granulocytes: 0.01 K/uL (ref 0.00–0.07)
Basophils Absolute: 0.1 K/uL (ref 0.0–0.1)
Basophils Relative: 2 %
Eosinophils Absolute: 0.2 K/uL (ref 0.0–0.5)
Eosinophils Relative: 6 %
HCT: 34.5 % — ABNORMAL LOW (ref 36.0–46.0)
Hemoglobin: 11.3 g/dL — ABNORMAL LOW (ref 12.0–15.0)
Immature Granulocytes: 0 %
Lymphocytes Relative: 30 %
Lymphs Abs: 1 K/uL (ref 0.7–4.0)
MCH: 29.4 pg (ref 26.0–34.0)
MCHC: 32.8 g/dL (ref 30.0–36.0)
MCV: 89.6 fL (ref 80.0–100.0)
Monocytes Absolute: 0.4 K/uL (ref 0.1–1.0)
Monocytes Relative: 12 %
Neutro Abs: 1.7 K/uL (ref 1.7–7.7)
Neutrophils Relative %: 50 %
Platelet Count: 201 K/uL (ref 150–400)
RBC: 3.85 MIL/uL — ABNORMAL LOW (ref 3.87–5.11)
RDW: 13.2 % (ref 11.5–15.5)
WBC Count: 3.4 K/uL — ABNORMAL LOW (ref 4.0–10.5)
nRBC: 0 % (ref 0.0–0.2)

## 2024-06-08 LAB — PREGNANCY, URINE: Preg Test, Ur: NEGATIVE

## 2024-06-08 MED ORDER — DARATUMUMAB-HYALURONIDASE-FIHJ 1800-30000 MG-UT/15ML ~~LOC~~ SOLN
1800.0000 mg | Freq: Once | SUBCUTANEOUS | Status: AC
  Administered 2024-06-08: 1800 mg via SUBCUTANEOUS
  Filled 2024-06-08: qty 15

## 2024-06-08 MED ORDER — DEXAMETHASONE 6 MG PO TABS
20.0000 mg | ORAL_TABLET | Freq: Once | ORAL | Status: AC
  Administered 2024-06-08: 20 mg via ORAL
  Filled 2024-06-08: qty 2

## 2024-06-08 MED ORDER — DIPHENHYDRAMINE HCL 25 MG PO CAPS
25.0000 mg | ORAL_CAPSULE | Freq: Once | ORAL | Status: AC
  Administered 2024-06-08: 25 mg via ORAL
  Filled 2024-06-08: qty 1

## 2024-06-08 MED ORDER — ACETAMINOPHEN 325 MG PO TABS
650.0000 mg | ORAL_TABLET | Freq: Once | ORAL | Status: AC
  Administered 2024-06-08: 650 mg via ORAL
  Filled 2024-06-08: qty 2

## 2024-06-08 NOTE — Patient Instructions (Signed)

## 2024-06-08 NOTE — Progress Notes (Signed)
 [facility] Cancer Follow up:    Kara Clotilda SAUNDERS, MD [address] [place] [address]   DIAGNOSIS: Multiple Myeloma  SUMMARY OF ONCOLOGIC HISTORY: Oncology History  Multiple myeloma (HCC)  09/12/2022 Initial Diagnosis   Multiple myeloma (HCC)   09/20/2022 - 10/04/2022 Chemotherapy   Patient is on Treatment Plan : MYELOMA NEWLY DIAGNOSED TRANSPLANT CANDIDATE DaraVRd (Daratumumab  IV) q21d x 6 Cycles (Induction/Consolidation)     10/11/2022 - 10/22/2022 Chemotherapy   Patient is on Treatment Plan : MYELOMA NON-TRANSPLANT CANDIDATES VRd SQ q21d     11/04/2022 - 03/04/2023 Chemotherapy   Patient is on Treatment Plan : MYELOMA NEWLY DIAGNOSED TRANSPLANT CANDIDATE VRd q21d starting cycle 6 (Induction/Consolidation)     07/31/2023 - 07/31/2023 Chemotherapy   Patient is on Treatment Plan : MYELOMA Daratumumab  IV q28d     08/05/2023 -  Chemotherapy   Patient is on Treatment Plan : MYELOMA NEWLY DIAGNOSED TRANSPLANT CANDIDATE Daratumumab  SQ + Lenalidomide  q28d (Maintenance)      ASBMT risk stratification: Low Risk, PR1 ECOG/KPS: 0/90% HSCT-CI score: 2 (Obesity, Age >40) Preparative regimen/treatment protocol: Melphalan 200 mg/m2   03/26/2023 - Bone Marrow Transplant  AUTOLOGOUS STEM CELL TRANSPLANT: Disease status at time of transplant: PR1 ASBMT risk stratification: low risk, ECOG/KPS: 1 / 90%, HSCT-CI score: 2 Mobilization was done with G-CSF and Mozobil with a yield of 2.104 x 10(7) CD34+ cells.  Preparative regimen with Melphalan 200 mg/m2 on 03/25/2023 (outpatient). Infusion of stem cells - 7.01 x 10(6) on 03/26/2023 (outpatient) Completed stem cell process 04/08/2023. Course minimally complicated by mucositis.   ENGRAFTMENT: WBC engraftment on 04/06/2023 and platelet engraftment on 04/11/2023 at [facility]. Last platelet transfusion given 04/04/2023.  04/04/2023 - Supportive Treatment  Adult OP Blood Administration PRN x 30 DAYS Plan Provider: Dorn Dallas Ehrlich, MD   CURRENT THERAPY: DaraVRD  INTERVAL HISTORY:  Kara Medina is a 48 year old female with multiple myeloma who presents for follow-up on her treatment regimen.    She had autologous transplant on 03/26/2023.  She started post transplant revelimid on 1.30.2025  Discussed the use of AI scribe software for clinical note transcription with the patient, who gave verbal consent to proceed.  History of Present Illness  Kara Medina is a 48 year old female who presents for a follow-up visit in hematologyand oncology for follow up. During a recent annual visit, her lab results indicated a very low vitamin D  level. She has been prescribed a high-dose vitamin D  supplement to be taken once a week for twelve weeks. No new health issues or medications aside from the vitamin D  supplement.  She is experiencing ongoing issues with obtaining her medication due to shipment problems and the need for repeated pregnancy tests. She recently received her medication via [employer] after a delay and plans to take another pregnancy test next week as the current one will be outdated.  Rest of the pertinent 10 point ROS reviewed and negative  Patient Active Problem List   Diagnosis Date Noted   Family history of mixed hyperlipidemia 01/30/2023   Obstructive sleep apnea 01/30/2023   Port-A-Cath in place 01/06/2023   HTN (hypertension) 11/14/2022   Multiple myeloma (HCC) 09/12/2022   MGUS (monoclonal gammopathy of unknown significance) 09/10/2022   Hypertensive urgency 08/29/2022   Iron deficiency anemia 08/29/2022   LVH (left ventricular hypertrophy) 08/29/2022   Normocytic anemia 08/29/2022    is allergic to chlorhexidine, tape, amoxicillin, and morphine sulfate.  MEDICAL HISTORY: Past Medical  History:  Diagnosis Date   Anemia 08/23/2022   Gestational diabetes    diet controlled   Multiple myeloma (HCC)    Postpartum care following vaginal delivery (7/27) 02/09/2016   Pregnancy induced  hypertension    Trigeminal neuralgia    2012 corrected with surgery    SURGICAL HISTORY: Past Surgical History:  Procedure Laterality Date   APPENDECTOMY  2008   BRAIN SURGERY  2012   Trigeminal Neuralgia surgery   IR IMAGING GUIDED PORT INSERTION  12/03/2022   TRIGEMINAL NERVE DECOMPRESSION  2012    SOCIAL HISTORY: Social History   Socioeconomic History   Marital status: Married    Spouse name: Not on file   Number of children: Not on file   Years of education: Not on file   Highest education level: Bachelor's degree (e.g., BA, AB, BS)  Occupational History   Not on file  Tobacco Use   Smoking status: Never   Smokeless tobacco: Never  Vaping Use   Vaping status: Never Used  Substance and Sexual Activity   Alcohol use: No   Drug use: No   Sexual activity: Yes  Other Topics Concern   Not on file  Social History Narrative   Not on file   Social Drivers of Health   Financial Resource Strain: Low Risk  (05/25/2024)   Overall Financial Resource Strain (CARDIA)    Difficulty of Paying Living Expenses: Not hard at all  Food Insecurity: No Food Insecurity (05/25/2024)   Hunger Vital Sign    Worried About Running Out of Food in the Last Year: Never true    Ran Out of Food in the Last Year: Never true  Transportation Needs: No Transportation Needs (05/25/2024)   PRAPARE - Administrator, Civil Service (Medical): No    Lack of Transportation (Non-Medical): No  Physical Activity: Insufficiently Active (05/25/2024)   Exercise Vital Sign    Days of Exercise per Week: 3 days    Minutes of Exercise per Session: 30 min  Stress: No Stress Concern Present (05/25/2024)   [employer] of Occupational Health - Occupational Stress Questionnaire    Feeling of Stress: Not at all  Social Connections: Moderately Isolated (05/25/2024)   Social Connection and Isolation Panel    Frequency of Communication with Friends and Family: More than three times a week     Frequency of Social Gatherings with Friends and Family: Once a week    Attends Religious Services: Never    Database Administrator or Organizations: No    Attends Engineer, Structural: Not on file    Marital Status: Married  Catering Manager Violence: Not on file    FAMILY HISTORY: Family History  Problem Relation Age of Onset   Cancer Other    Hypertension Other    Cancer Father    Cancer Paternal Grandfather        stomach, lung   Hypertension Mother    Breast cancer Mother 48   Cancer Mother    Hypertension Maternal Grandmother    Arthritis Maternal Grandmother    Stroke Maternal Grandmother    Hypertension Maternal Grandfather    Other Brother        ?prediabetic   Diabetes Paternal Grandmother    Hypertension Paternal Grandmother    Autism Son     PHYSICAL EXAMINATION     Vitals:   06/08/24 0834  BP: 136/82  Pulse: 96  Resp: 16  Temp: 98.3 F (36.8 C)  SpO2:  100%    Physical Exam Constitutional:      General: She is not in acute distress.    Appearance: Normal appearance. She is not toxic-appearing.  HENT:     Head: Normocephalic and atraumatic.     Mouth/Throat:     Mouth: Mucous membranes are moist.     Pharynx: Oropharynx is clear. No oropharyngeal exudate or posterior oropharyngeal erythema.  Eyes:     General: No scleral icterus. Cardiovascular:     Rate and Rhythm: Normal rate and regular rhythm.     Pulses: Normal pulses.     Heart sounds: Normal heart sounds.  Pulmonary:     Effort: Pulmonary effort is normal.     Breath sounds: Normal breath sounds.  Abdominal:     General: Abdomen is flat. Bowel sounds are normal. There is no distension.     Palpations: Abdomen is soft.     Tenderness: There is no abdominal tenderness.  Musculoskeletal:        General: No swelling.     Cervical back: Neck supple.  Lymphadenopathy:     Cervical: No cervical adenopathy.  Skin:    General: Skin is warm and dry.     Findings: No rash.   Neurological:     General: No focal deficit present.     Mental Status: She is alert.  Psychiatric:        Mood and Affect: Mood normal.        Behavior: Behavior normal.     LABORATORY DATA:  CBC    Component Value Date/Time   WBC 3.4 (L) 06/08/2024 0753   WBC 3.4 (L) 03/16/2024 1423   RBC 3.85 (L) 06/08/2024 0753   HGB 11.3 (L) 06/08/2024 0753   HCT 34.5 (L) 06/08/2024 0753   HCT 24.4 (L) 09/04/2022 1037   PLT 201 06/08/2024 0753   MCV 89.6 06/08/2024 0753   MCH 29.4 06/08/2024 0753   MCHC 32.8 06/08/2024 0753   RDW 13.2 06/08/2024 0753   LYMPHSABS 1.0 06/08/2024 0753   MONOABS 0.4 06/08/2024 0753   EOSABS 0.2 06/08/2024 0753   BASOSABS 0.1 06/08/2024 0753    CMP     Component Value Date/Time   NA 140 05/11/2024 1047   K 3.9 05/11/2024 1047   CL 107 05/11/2024 1047   CO2 29 05/11/2024 1047   GLUCOSE 90 05/11/2024 1047   BUN 13 05/11/2024 1047   CREATININE 0.80 05/11/2024 1047   CREATININE 0.95 08/23/2022 1631   CALCIUM 8.8 (L) 05/11/2024 1047   PROT 6.3 (L) 05/11/2024 1047   ALBUMIN 4.0 05/11/2024 1047   AST 16 05/11/2024 1047   ALT 15 05/11/2024 1047   ALKPHOS 56 05/11/2024 1047   BILITOT 0.5 05/11/2024 1047   GFRNONAA >60 05/11/2024 1047   GFRAA >60 02/09/2016 0525    Last M protein at [facility], minute M spike to quantitate  ASSESSMENT and THERAPY PLAN:   No problem-specific Assessment & Plan notes found for this encounter.  Multiple Myeloma Ig G Kappa MM, status post daratumumab  Revlimid  and dexamethasone  induction followed by autologous stem cell transplant in September 2024. Post-transplant, PR1 with M spike reduction to 0.23 grams. MRD positive at 0.1593%. She is on maintenance with daratumumab  and Revlimid  for 2-3 yrs - Daratumumab  subcu every 28 days, Revlimid  10 mg daily from day 1 to day 21 followed by 1 week off -Continue Acyclovir  for a total of 1 year post-transplant. Assessment & Plan  Multiple myeloma  Multiple myeloma stable with slight  M protein variation. Blood counts stable, kidney function normal, low myeloma activity. - Continue daratumumab  and Revlimid  with zometa  support - Proceed with scheduled treatments. - Monitor M protein levels and blood counts. - Schedule Zometa  infusion for December.  Vitamin D  deficiency Identified during a recent PCP visit. Currently on high-dose vitamin D  supplementation. - Continue high-dose vitamin D  supplementation once a week for twelve weeks.  Zoledronic  acid (Zometa ) therapy monitoring Zoledronic  acid therapy is ongoing with the next dose scheduled for December. Orders for the December dose are in place and signed. - Ensure Zoledronic  acid (Zometa ) is administered in December as scheduled.  All questions were answered. The patient knows to call the clinic with any problems, questions or concerns. We can certainly see the patient much sooner if necessary.  Total encounter time:30 minutes*in face-to-face visit time, chart review, lab review, care coordination, order entry, and documentation of the encounter time.   *Total Encounter Time as defined by the Centers for Medicare and Medicaid Services includes, in addition to the face-to-face time of a patient visit (documented in the note above) non-face-to-face time: obtaining and reviewing outside history, ordering and reviewing medications, tests or procedures, care coordination (communications with other health care professionals or caregivers) and documentation in the medical record.

## 2024-06-09 LAB — KAPPA/LAMBDA LIGHT CHAINS
Kappa free light chain: 16.7 mg/L (ref 3.3–19.4)
Kappa, lambda light chain ratio: 1.99 — ABNORMAL HIGH (ref 0.26–1.65)
Lambda free light chains: 8.4 mg/L (ref 5.7–26.3)

## 2024-06-09 LAB — PROTEIN ELECTROPHORESIS, SERUM
A/G Ratio: 1.2 (ref 0.7–1.7)
Albumin ELP: 3.3 g/dL (ref 2.9–4.4)
Alpha-1-Globulin: 0.2 g/dL (ref 0.0–0.4)
Alpha-2-Globulin: 0.8 g/dL (ref 0.4–1.0)
Beta Globulin: 1 g/dL (ref 0.7–1.3)
Gamma Globulin: 0.7 g/dL (ref 0.4–1.8)
Globulin, Total: 2.8 g/dL (ref 2.2–3.9)
M-Spike, %: 0.4 g/dL — ABNORMAL HIGH
Total Protein ELP: 6.1 g/dL (ref 6.0–8.5)

## 2024-06-09 LAB — BETA 2 MICROGLOBULIN, SERUM: Beta-2 Microglobulin: 1.4 mg/L (ref 0.6–2.4)

## 2024-06-16 ENCOUNTER — Encounter: Payer: Self-pay | Admitting: Hematology and Oncology

## 2024-06-16 ENCOUNTER — Other Ambulatory Visit: Payer: Self-pay | Admitting: Hematology and Oncology

## 2024-06-16 DIAGNOSIS — C9000 Multiple myeloma not having achieved remission: Secondary | ICD-10-CM

## 2024-06-16 DIAGNOSIS — D472 Monoclonal gammopathy: Secondary | ICD-10-CM

## 2024-06-17 ENCOUNTER — Other Ambulatory Visit: Payer: Self-pay | Admitting: Hematology and Oncology

## 2024-06-17 DIAGNOSIS — D472 Monoclonal gammopathy: Secondary | ICD-10-CM

## 2024-06-17 DIAGNOSIS — C9000 Multiple myeloma not having achieved remission: Secondary | ICD-10-CM

## 2024-06-18 ENCOUNTER — Encounter: Payer: Self-pay | Admitting: Hematology and Oncology

## 2024-06-20 LAB — IFE, DARA-SPECIFIC, SERUM
IgA: 193 mg/dL (ref 87–352)
IgG (Immunoglobin G), Serum: 761 mg/dL (ref 586–1602)
IgM (Immunoglobulin M), Srm: 32 mg/dL (ref 26–217)

## 2024-06-21 ENCOUNTER — Other Ambulatory Visit: Payer: Self-pay | Admitting: Hematology and Oncology

## 2024-06-21 DIAGNOSIS — C9000 Multiple myeloma not having achieved remission: Secondary | ICD-10-CM

## 2024-06-21 DIAGNOSIS — D472 Monoclonal gammopathy: Secondary | ICD-10-CM

## 2024-06-22 ENCOUNTER — Inpatient Hospital Stay: Attending: Hematology and Oncology

## 2024-06-22 ENCOUNTER — Telehealth: Payer: Self-pay

## 2024-06-22 ENCOUNTER — Other Ambulatory Visit: Payer: Self-pay

## 2024-06-22 DIAGNOSIS — Z5112 Encounter for antineoplastic immunotherapy: Secondary | ICD-10-CM | POA: Insufficient documentation

## 2024-06-22 DIAGNOSIS — C9000 Multiple myeloma not having achieved remission: Secondary | ICD-10-CM

## 2024-06-22 DIAGNOSIS — Z79620 Long term (current) use of immunosuppressive biologic: Secondary | ICD-10-CM | POA: Insufficient documentation

## 2024-06-22 LAB — PREGNANCY, URINE: Preg Test, Ur: NEGATIVE

## 2024-06-22 NOTE — Telephone Encounter (Signed)
 S/w pt today regarding authorization for her Revlimid . [facility] spec phx states the shara has expired. This is d/t [facility] not completing the RX the day the auth was sent. Expressed concerns to pharmacist about pt needing to come in for multiple visits d/t this and how inconvenient it is for the pt.   Pt cam in today for UPT and it was negative; therefore, new auth submitted and given via phone to [facility] specialty pharmacist [phone].  Pt made aware and she is agreeable to come in 12/31 at 0800 for UPT to complete the [employer] survey for her new cycle of Revlimid  so there will be no time constraints for refilling. She verbalized thanks and understanding.

## 2024-06-24 ENCOUNTER — Encounter: Payer: Self-pay | Admitting: Hematology and Oncology

## 2024-07-06 ENCOUNTER — Inpatient Hospital Stay

## 2024-07-06 ENCOUNTER — Ambulatory Visit: Admitting: Hematology and Oncology

## 2024-07-06 VITALS — BP 130/78 | HR 85 | Temp 98.3°F | Resp 18

## 2024-07-06 VITALS — BP 134/87 | HR 97 | Temp 98.0°F | Resp 18 | Ht 66.0 in | Wt 226.7 lb

## 2024-07-06 DIAGNOSIS — D472 Monoclonal gammopathy: Secondary | ICD-10-CM

## 2024-07-06 DIAGNOSIS — C9000 Multiple myeloma not having achieved remission: Secondary | ICD-10-CM | POA: Diagnosis not present

## 2024-07-06 DIAGNOSIS — D649 Anemia, unspecified: Secondary | ICD-10-CM

## 2024-07-06 DIAGNOSIS — Z95828 Presence of other vascular implants and grafts: Secondary | ICD-10-CM

## 2024-07-06 DIAGNOSIS — C9001 Multiple myeloma in remission: Secondary | ICD-10-CM

## 2024-07-06 DIAGNOSIS — Z5112 Encounter for antineoplastic immunotherapy: Secondary | ICD-10-CM | POA: Diagnosis not present

## 2024-07-06 LAB — CMP (CANCER CENTER ONLY)
ALT: 19 U/L (ref 0–44)
AST: 21 U/L (ref 15–41)
Albumin: 4.2 g/dL (ref 3.5–5.0)
Alkaline Phosphatase: 59 U/L (ref 38–126)
Anion gap: 8 (ref 5–15)
BUN: 14 mg/dL (ref 6–20)
CO2: 26 mmol/L (ref 22–32)
Calcium: 8.7 mg/dL — ABNORMAL LOW (ref 8.9–10.3)
Chloride: 106 mmol/L (ref 98–111)
Creatinine: 0.78 mg/dL (ref 0.44–1.00)
GFR, Estimated: >60 mL/min
Glucose, Bld: 96 mg/dL (ref 70–99)
Potassium: 3.8 mmol/L (ref 3.5–5.1)
Sodium: 140 mmol/L (ref 135–145)
Total Bilirubin: 0.4 mg/dL (ref 0.0–1.2)
Total Protein: 6.4 g/dL — ABNORMAL LOW (ref 6.5–8.1)

## 2024-07-06 LAB — CBC WITH DIFFERENTIAL (CANCER CENTER ONLY)
Abs Immature Granulocytes: 0.01 K/uL (ref 0.00–0.07)
Basophils Absolute: 0.1 K/uL (ref 0.0–0.1)
Basophils Relative: 2 %
Eosinophils Absolute: 0.4 K/uL (ref 0.0–0.5)
Eosinophils Relative: 8 %
HCT: 32.6 % — ABNORMAL LOW (ref 36.0–46.0)
Hemoglobin: 10.8 g/dL — ABNORMAL LOW (ref 12.0–15.0)
Immature Granulocytes: 0 %
Lymphocytes Relative: 24 %
Lymphs Abs: 1.1 K/uL (ref 0.7–4.0)
MCH: 29.9 pg (ref 26.0–34.0)
MCHC: 33.1 g/dL (ref 30.0–36.0)
MCV: 90.3 fL (ref 80.0–100.0)
Monocytes Absolute: 0.7 K/uL (ref 0.1–1.0)
Monocytes Relative: 14 %
Neutro Abs: 2.4 K/uL (ref 1.7–7.7)
Neutrophils Relative %: 52 %
Platelet Count: 197 K/uL (ref 150–400)
RBC: 3.61 MIL/uL — ABNORMAL LOW (ref 3.87–5.11)
RDW: 12.9 % (ref 11.5–15.5)
WBC Count: 4.6 K/uL (ref 4.0–10.5)
nRBC: 0 % (ref 0.0–0.2)

## 2024-07-06 LAB — PREGNANCY, URINE: Preg Test, Ur: NEGATIVE

## 2024-07-06 MED ORDER — DARATUMUMAB-HYALURONIDASE-FIHJ 1800-30000 MG-UT/15ML ~~LOC~~ SOLN
1800.0000 mg | Freq: Once | SUBCUTANEOUS | Status: AC
  Administered 2024-07-06: 1800 mg via SUBCUTANEOUS
  Filled 2024-07-06: qty 15

## 2024-07-06 MED ORDER — ACETAMINOPHEN 325 MG PO TABS
650.0000 mg | ORAL_TABLET | Freq: Once | ORAL | Status: AC
  Administered 2024-07-06: 650 mg via ORAL
  Filled 2024-07-06: qty 2

## 2024-07-06 MED ORDER — DEXAMETHASONE 6 MG PO TABS
20.0000 mg | ORAL_TABLET | Freq: Once | ORAL | Status: AC
  Administered 2024-07-06: 20 mg via ORAL
  Filled 2024-07-06: qty 2

## 2024-07-06 MED ORDER — CETIRIZINE HCL 10 MG PO TABS
10.0000 mg | ORAL_TABLET | Freq: Once | ORAL | Status: AC
  Administered 2024-07-06: 10 mg via ORAL
  Filled 2024-07-06: qty 1

## 2024-07-06 MED ORDER — ZOLEDRONIC ACID 4 MG/100ML IV SOLN
4.0000 mg | Freq: Once | INTRAVENOUS | Status: AC
  Administered 2024-07-06: 4 mg via INTRAVENOUS
  Filled 2024-07-06: qty 100

## 2024-07-06 MED ORDER — SODIUM CHLORIDE 0.9 % IV SOLN: INTRAVENOUS | Status: DC

## 2024-07-06 NOTE — Progress Notes (Signed)
 "  Patient Care Team: [name], MD as PCP - General (Family Medicine) Santo Stanly LABOR, MD as PCP - Cardiology (Cardiology) Rutherford Gain, MD as Consulting Physician (Obstetrics and Gynecology)  DIAGNOSIS:  Encounter Diagnosis  Name Primary?   Multiple myeloma not having achieved remission (HCC) Yes    SUMMARY OF ONCOLOGIC HISTORY: Oncology History  Multiple myeloma (HCC)  09/12/2022 Initial Diagnosis   Multiple myeloma (HCC)   09/20/2022 - 10/04/2022 Chemotherapy   Patient is on Treatment Plan : MYELOMA NEWLY DIAGNOSED TRANSPLANT CANDIDATE DaraVRd (Daratumumab  IV) q21d x 6 Cycles (Induction/Consolidation)     10/11/2022 - 10/22/2022 Chemotherapy   Patient is on Treatment Plan : MYELOMA NON-TRANSPLANT CANDIDATES VRd SQ q21d     11/04/2022 - 03/04/2023 Chemotherapy   Patient is on Treatment Plan : MYELOMA NEWLY DIAGNOSED TRANSPLANT CANDIDATE VRd q21d starting cycle 6 (Induction/Consolidation)     07/31/2023 - 07/31/2023 Chemotherapy   Patient is on Treatment Plan : MYELOMA Daratumumab  IV q28d     08/05/2023 -  Chemotherapy   Patient is on Treatment Plan : MYELOMA NEWLY DIAGNOSED TRANSPLANT CANDIDATE Daratumumab  SQ + Lenalidomide  q28d (Maintenance)       CHIEF COMPLIANT: Follow-up of multiple myeloma, daratumumab  and Revlimid   HISTORY OF PRESENT ILLNESS:   History of Present Illness Kara Medina is a 48 year old female with multiple myeloma who presents for routine hematology/oncology follow-up and ongoing management.  She is receiving daratumumab  and Revlimid  and is tolerating treatment. She reports mild fatigue without new or worsening myeloma- or treatment-related symptoms.  Labs today show stable white blood cell count and hemoglobin, with hemoglobin typically 9-11 g/dL and 89.1 g/dL most recently. Prior labs showed mild hypocalcemia and low protein. Electrolytes and kidney and liver function are normal.  She was recently found to have low vitamin D  and  started weekly vitamin D  supplementation for twelve weeks with plans for repeat testing after completion.  She has mild nasal congestion after exposure to her son but denies fever or other infectious symptoms.     ALLERGIES:  is allergic to chlorhexidine, tape, amoxicillin, and morphine sulfate.  MEDICATIONS:  Current Outpatient Medications  Medication Sig Dispense Refill   acyclovir  (ZOVIRAX ) 400 MG tablet Take 2 tablets (800 mg total) by mouth 2 (two) times daily. 60 tablet 5   amLODipine  (NORVASC ) 5 MG tablet Take 1 tablet (5 mg total) by mouth daily. 90 tablet 3   aspirin EC 81 MG tablet Take 81 mg by mouth daily. Swallow whole.     Blood Pressure Monitoring (BLOOD PRESSURE CUFF) MISC 1 Device by Does not apply route daily. 1 each 0   cefadroxil  (DURICEF) 500 MG capsule Take 1 capsule (500 mg total) by mouth 2 (two) times daily. 10 capsule 0   cyclobenzaprine  (FLEXERIL ) 5 MG tablet Take 1 tablet (5 mg total) by mouth 3 (three) times daily as needed for muscle spasms. 30 tablet 0   dexamethasone  (DECADRON ) 4 MG tablet Take by mouth.     ergocalciferol  (VITAMIN D2) 1.25 MG (50000 UT) capsule Take 50,000 Units by mouth once a week.     folic acid -vitamin b complex-vitamin c-selenium-zinc (DIALYVITE) 3 MG TABS tablet Take 1 tablet by mouth daily. (Patient not taking: Reported on 05/27/2024)     ibuprofen  (ADVIL ) 200 MG tablet Take 400 mg by mouth every 6 (six) hours as needed for headache or moderate pain.     labetalol  (NORMODYNE ) 200 MG tablet Take 1 tablet (200 mg total) by mouth 2 (two) times  daily. 180 tablet 3   lenalidomide  (REVLIMID ) 10 MG capsule TAKE 1 CAPSULE BY MOUTH 1 TIME A DAY 21 capsule 0   lidocaine -prilocaine  (EMLA ) cream Apply topically once.     lidocaine -prilocaine  (EMLA ) cream Apply to affected area once 30 g 3   meclizine  (ANTIVERT ) 25 MG tablet Take 1 tablet (25 mg total) by mouth 3 (three) times daily as needed for dizziness. 30 tablet 0   Multiple Vitamins-Minerals  (CENTRUM ADULTS) TABS Take 1 tablet by mouth daily.     ondansetron  (ZOFRAN -ODT) 4 MG disintegrating tablet Take 1 tablet (4 mg total) by mouth every 8 (eight) hours as needed for nausea or vomiting. 20 tablet 0   prochlorperazine  (COMPAZINE ) 10 MG tablet Take 1 tablet (10 mg total) by mouth every 6 (six) hours as needed for nausea or vomiting. 30 tablet 1   traMADol  (ULTRAM ) 50 MG tablet Take 1 tablet (50 mg total) by mouth every 12 (twelve) hours as needed. 30 tablet 0   Vitamin D , Ergocalciferol , (DRISDOL ) 1.25 MG (50000 UNIT) CAPS capsule Take 1 capsule (50,000 Units total) by mouth every 7 (seven) days. 12 capsule 0   WEGOVY 0.25 MG/0.5ML SOAJ SQ injection Inject 0.25 mg into the skin once a week.     No current facility-administered medications for this visit.    PHYSICAL EXAMINATION: ECOG PERFORMANCE STATUS: 1 - Symptomatic but completely ambulatory  Vitals:   07/06/24 0800  BP: 134/87  Pulse: 97  Resp: 18  Temp: 98 F (36.7 C)  SpO2: 100%   Filed Weights   07/06/24 0800  Weight: 226 lb 11.2 oz (102.8 kg)    Physical Exam   (exam performed in the presence of a chaperone)  LABORATORY DATA:  I have reviewed the data as listed    Latest Ref Rng & Units 07/06/2024    8:12 AM 06/08/2024    7:53 AM 05/11/2024   10:47 AM  CMP  Glucose 70 - 99 mg/dL 96  885  90   BUN 6 - 20 mg/dL 14  15  13    Creatinine 0.44 - 1.00 mg/dL 9.21  9.24  9.19   Sodium 135 - 145 mmol/L 140  140  140   Potassium 3.5 - 5.1 mmol/L 3.8  3.7  3.9   Chloride 98 - 111 mmol/L 106  106  107   CO2 22 - 32 mmol/L 26  26  29    Calcium 8.9 - 10.3 mg/dL 8.7  9.1  8.8   Total Protein 6.5 - 8.1 g/dL 6.4  6.7  6.3   Total Bilirubin 0.0 - 1.2 mg/dL 0.4  0.6  0.5   Alkaline Phos 38 - 126 U/L 59  56  56   AST 15 - 41 U/L 21  23  16    ALT 0 - 44 U/L 19  17  15      Lab Results  Component Value Date   WBC 4.6 07/06/2024   HGB 10.8 (L) 07/06/2024   HCT 32.6 (L) 07/06/2024   MCV 90.3 07/06/2024   PLT 197  07/06/2024   NEUTROABS 2.4 07/06/2024    ASSESSMENT & PLAN:  Multiple myeloma (HCC) Ig G Kappa MM, status post daratumumab  Revlimid  and dexamethasone  induction followed by autologous stem cell transplant in September 2024.  Post-transplant, PR1 with M spike reduction to 0.23 grams. MRD positive at 0.1593%. She is on maintenance with daratumumab  and Revlimid  for 2-3 yrs  Lab review: 06/08/2024: M protein 0.4 g IgG, kappa 16.7, lambda 8.4,  ratio 1.99, beta-2  microglobulin 1.4  Current treatment: Daratumumab  subcu every 28 days, Revlimid  10 mg daily from day 1 to day 21 followed by 1 week off -Continue Acyclovir  for a total of 1 year post-transplant. -Zometa  infusion      No orders of the defined types were placed in this encounter.  The patient has a good understanding of the overall plan. she agrees with it. she will call with any problems that may develop before the next visit here.  I personally spent a total of 30 minutes in the care of the patient today including preparing to see the patient, getting/reviewing separately obtained history, performing a medically appropriate exam/evaluation, counseling and educating, placing orders, referring and communicating with other health care professionals, documenting clinical information in the EHR, independently interpreting results, communicating results, and coordinating care.   Viinay K Kyndell Zeiser, MD 07/06/2024    "

## 2024-07-06 NOTE — Patient Instructions (Signed)
 [facility] - A DEPT OF [facility]  Discharge Instructions: Thank you for choosing [facility] to provide your oncology and hematology care.   If you have a lab appointment with the Cancer Center, please go directly to the Cancer Center and check in at the registration area.   Wear comfortable clothing and clothing appropriate for easy access to any Portacath or PICC line.   We strive to give you quality time with your provider. You may need to reschedule your appointment if you arrive late (15 or more minutes).  Arriving late affects you and other patients whose appointments are after yours.  Also, if you miss three or more appointments without notifying the office, you may be dismissed from the clinic at the providers discretion.      For prescription refill requests, have your pharmacy contact our office and allow 72 hours for refills to be completed.    Today you received the following chemotherapy and/or immunotherapy agents darzalex  faspro,   Zometa    To help prevent nausea and vomiting after your treatment, we encourage you to take your nausea medication as directed.  BELOW ARE SYMPTOMS THAT SHOULD BE REPORTED IMMEDIATELY: *FEVER GREATER THAN 100.4 F (38 C) OR HIGHER *CHILLS OR SWEATING *NAUSEA AND VOMITING THAT IS NOT CONTROLLED WITH YOUR NAUSEA MEDICATION *UNUSUAL SHORTNESS OF BREATH *UNUSUAL BRUISING OR BLEEDING *URINARY PROBLEMS (pain or burning when urinating, or frequent urination) *BOWEL PROBLEMS (unusual diarrhea, constipation, pain near the anus) TENDERNESS IN MOUTH AND THROAT WITH OR WITHOUT PRESENCE OF ULCERS (sore throat, sores in mouth, or a toothache) UNUSUAL RASH, SWELLING OR PAIN  UNUSUAL VAGINAL DISCHARGE OR ITCHING   Items with * indicate a potential emergency and should be followed up as soon as possible or go to the Emergency Department if any problems should occur.  Please show the CHEMOTHERAPY ALERT CARD or  IMMUNOTHERAPY ALERT CARD at check-in to the Emergency Department and triage nurse.  Should you have questions after your visit or need to cancel or reschedule your appointment, please contact [facility] - A DEPT OF [facility]  Dept: [phone]  and follow the prompts.  Office hours are 8:00 a.m. to 4:30 p.m. Monday - Friday. Please note that voicemails left after 4:00 p.m. may not be returned until the following business day.  We are closed weekends and major holidays. You have access to a nurse at all times for urgent questions. Please call the main number to the clinic Dept: [phone] and follow the prompts.   For any non-urgent questions, you may also contact your provider using MyChart. We now offer e-Visits for anyone 73 and older to request care online for non-urgent symptoms. For details visit [email or web address].   Also download the MyChart app! Go to the app store, search MyChart, open the app, select [facility], and log in with your MyChart username and password.

## 2024-07-06 NOTE — Assessment & Plan Note (Signed)
 Ig G Kappa MM, status post daratumumab  Revlimid  and dexamethasone  induction followed by autologous stem cell transplant in September 2024.  Post-transplant, PR1 with M spike reduction to 0.23 grams. MRD positive at 0.1593%. She is on maintenance with daratumumab  and Revlimid  for 2-3 yrs  Lab review: 06/08/2024: M protein 0.4 g IgG, kappa 16.7, lambda 8.4, ratio 1.99, beta-2  microglobulin 1.4  Current treatment: Daratumumab  subcu every 28 days, Revlimid  10 mg daily from day 1 to day 21 followed by 1 week off -Continue Acyclovir  for a total of 1 year post-transplant. -Zometa  infusion

## 2024-07-07 LAB — KAPPA/LAMBDA LIGHT CHAINS
Kappa free light chain: 19.6 mg/L — ABNORMAL HIGH (ref 3.3–19.4)
Kappa, lambda light chain ratio: 1.88 — ABNORMAL HIGH (ref 0.26–1.65)
Lambda free light chains: 10.4 mg/L (ref 5.7–26.3)

## 2024-07-07 LAB — BETA 2 MICROGLOBULIN, SERUM: Beta-2 Microglobulin: 1.6 mg/L (ref 0.6–2.4)

## 2024-07-07 LAB — ESTRADIOL: Estradiol: 12.5 pg/mL

## 2024-07-07 LAB — LUTEINIZING HORMONE: LH: 29.9 m[IU]/mL

## 2024-07-07 LAB — FOLLICLE STIMULATING HORMONE: FSH: 32.6 m[IU]/mL

## 2024-07-09 LAB — PROTEIN ELECTROPHORESIS, SERUM
A/G Ratio: 1.3 (ref 0.7–1.7)
Albumin ELP: 3.3 g/dL (ref 2.9–4.4)
Alpha-1-Globulin: 0.2 g/dL (ref 0.0–0.4)
Alpha-2-Globulin: 0.8 g/dL (ref 0.4–1.0)
Beta Globulin: 1 g/dL (ref 0.7–1.3)
Gamma Globulin: 0.6 g/dL (ref 0.4–1.8)
Globulin, Total: 2.6 g/dL (ref 2.2–3.9)
M-Spike, %: 0.3 g/dL — ABNORMAL HIGH
Total Protein ELP: 5.9 g/dL — ABNORMAL LOW (ref 6.0–8.5)

## 2024-07-14 ENCOUNTER — Other Ambulatory Visit: Payer: Self-pay | Admitting: Hematology and Oncology

## 2024-07-14 ENCOUNTER — Telehealth: Payer: Self-pay

## 2024-07-14 ENCOUNTER — Inpatient Hospital Stay

## 2024-07-14 DIAGNOSIS — D472 Monoclonal gammopathy: Secondary | ICD-10-CM

## 2024-07-14 DIAGNOSIS — C9000 Multiple myeloma not having achieved remission: Secondary | ICD-10-CM

## 2024-07-14 DIAGNOSIS — Z5112 Encounter for antineoplastic immunotherapy: Secondary | ICD-10-CM | POA: Diagnosis not present

## 2024-07-14 LAB — PREGNANCY, URINE: Preg Test, Ur: NEGATIVE

## 2024-07-14 NOTE — Telephone Encounter (Signed)
 Pt called into the office requesting a copy of her pathology report be faxed to [facility] to complete her Critical Illness Claim.

## 2024-07-18 LAB — IFE, DARA-SPECIFIC, SERUM
IgA: 197 mg/dL (ref 87–352)
IgG (Immunoglobin G), Serum: 688 mg/dL (ref 586–1602)
IgM (Immunoglobulin M), Srm: 21 mg/dL — ABNORMAL LOW (ref 26–217)

## 2024-08-03 ENCOUNTER — Inpatient Hospital Stay

## 2024-08-03 ENCOUNTER — Inpatient Hospital Stay: Attending: Hematology and Oncology

## 2024-08-03 ENCOUNTER — Inpatient Hospital Stay (HOSPITAL_BASED_OUTPATIENT_CLINIC_OR_DEPARTMENT_OTHER): Admitting: Hematology and Oncology

## 2024-08-03 ENCOUNTER — Other Ambulatory Visit: Payer: Self-pay | Admitting: Pharmacist

## 2024-08-03 VITALS — BP 134/81 | HR 86 | Temp 98.2°F | Resp 17 | Wt 225.8 lb

## 2024-08-03 DIAGNOSIS — D472 Monoclonal gammopathy: Secondary | ICD-10-CM

## 2024-08-03 DIAGNOSIS — Z79620 Long term (current) use of immunosuppressive biologic: Secondary | ICD-10-CM | POA: Insufficient documentation

## 2024-08-03 DIAGNOSIS — C9000 Multiple myeloma not having achieved remission: Secondary | ICD-10-CM | POA: Diagnosis not present

## 2024-08-03 DIAGNOSIS — Z5112 Encounter for antineoplastic immunotherapy: Secondary | ICD-10-CM | POA: Diagnosis present

## 2024-08-03 DIAGNOSIS — C9001 Multiple myeloma in remission: Secondary | ICD-10-CM

## 2024-08-03 DIAGNOSIS — D649 Anemia, unspecified: Secondary | ICD-10-CM

## 2024-08-03 LAB — CBC WITH DIFFERENTIAL (CANCER CENTER ONLY)
Abs Immature Granulocytes: 0.01 K/uL (ref 0.00–0.07)
Basophils Absolute: 0.1 K/uL (ref 0.0–0.1)
Basophils Relative: 2 %
Eosinophils Absolute: 0.3 K/uL (ref 0.0–0.5)
Eosinophils Relative: 8 %
HCT: 31.8 % — ABNORMAL LOW (ref 36.0–46.0)
Hemoglobin: 10.4 g/dL — ABNORMAL LOW (ref 12.0–15.0)
Immature Granulocytes: 0 %
Lymphocytes Relative: 41 %
Lymphs Abs: 1.5 K/uL (ref 0.7–4.0)
MCH: 29.9 pg (ref 26.0–34.0)
MCHC: 32.7 g/dL (ref 30.0–36.0)
MCV: 91.4 fL (ref 80.0–100.0)
Monocytes Absolute: 0.6 K/uL (ref 0.1–1.0)
Monocytes Relative: 16 %
Neutro Abs: 1.2 K/uL — ABNORMAL LOW (ref 1.7–7.7)
Neutrophils Relative %: 33 %
Platelet Count: 204 K/uL (ref 150–400)
RBC: 3.48 MIL/uL — ABNORMAL LOW (ref 3.87–5.11)
RDW: 13.1 % (ref 11.5–15.5)
WBC Count: 3.6 K/uL — ABNORMAL LOW (ref 4.0–10.5)
nRBC: 0 % (ref 0.0–0.2)

## 2024-08-03 LAB — CMP (CANCER CENTER ONLY)
ALT: 17 U/L (ref 0–44)
AST: 20 U/L (ref 15–41)
Albumin: 4.2 g/dL (ref 3.5–5.0)
Alkaline Phosphatase: 61 U/L (ref 38–126)
Anion gap: 9 (ref 5–15)
BUN: 18 mg/dL (ref 6–20)
CO2: 26 mmol/L (ref 22–32)
Calcium: 8.9 mg/dL (ref 8.9–10.3)
Chloride: 105 mmol/L (ref 98–111)
Creatinine: 0.83 mg/dL (ref 0.44–1.00)
GFR, Estimated: >60 mL/min
Glucose, Bld: 95 mg/dL (ref 70–99)
Potassium: 3.7 mmol/L (ref 3.5–5.1)
Sodium: 141 mmol/L (ref 135–145)
Total Bilirubin: 0.3 mg/dL (ref 0.0–1.2)
Total Protein: 6.5 g/dL (ref 6.5–8.1)

## 2024-08-03 MED ORDER — ACETAMINOPHEN 325 MG PO TABS
650.0000 mg | ORAL_TABLET | Freq: Once | ORAL | Status: AC
  Administered 2024-08-03: 650 mg via ORAL
  Filled 2024-08-03: qty 2

## 2024-08-03 MED ORDER — DEXAMETHASONE 6 MG PO TABS
20.0000 mg | ORAL_TABLET | Freq: Once | ORAL | Status: AC
  Administered 2024-08-03: 20 mg via ORAL
  Filled 2024-08-03: qty 2

## 2024-08-03 MED ORDER — DARATUMUMAB-HYALURONIDASE-FIHJ 1800-30000 MG-UT/15ML ~~LOC~~ SOLN
1800.0000 mg | Freq: Once | SUBCUTANEOUS | Status: AC
  Administered 2024-08-03: 1800 mg via SUBCUTANEOUS
  Filled 2024-08-03: qty 15

## 2024-08-03 MED ORDER — CETIRIZINE HCL 10 MG PO TABS
10.0000 mg | ORAL_TABLET | Freq: Once | ORAL | Status: AC
  Administered 2024-08-03: 10 mg via ORAL
  Filled 2024-08-03: qty 1

## 2024-08-03 NOTE — Progress Notes (Signed)
 Faspro auth expired 07/27/24. Okay to proceed with treatment today per Daphne while PA team works on auth.  Harlene Nasuti, PharmD Oncology Infusion Pharmacist 08/03/2024 1:49 PM

## 2024-08-03 NOTE — Patient Instructions (Signed)

## 2024-08-03 NOTE — Progress Notes (Signed)
 "  Patient Care Team: [name], MD as PCP - General (Family Medicine) Santo Stanly LABOR, MD as PCP - Cardiology (Cardiology) Rutherford Gain, MD as Consulting Physician (Obstetrics and Gynecology)  DIAGNOSIS:  Encounter Diagnoses  Name Primary?   MGUS (monoclonal gammopathy of unknown significance) Yes   Multiple myeloma not having achieved remission (HCC)      SUMMARY OF ONCOLOGIC HISTORY: Oncology History  Multiple myeloma (HCC)  09/12/2022 Initial Diagnosis   Multiple myeloma (HCC)   09/20/2022 - 10/04/2022 Chemotherapy   Patient is on Treatment Plan : MYELOMA NEWLY DIAGNOSED TRANSPLANT CANDIDATE DaraVRd (Daratumumab  IV) q21d x 6 Cycles (Induction/Consolidation)     10/11/2022 - 10/22/2022 Chemotherapy   Patient is on Treatment Plan : MYELOMA NON-TRANSPLANT CANDIDATES VRd SQ q21d     11/04/2022 - 03/04/2023 Chemotherapy   Patient is on Treatment Plan : MYELOMA NEWLY DIAGNOSED TRANSPLANT CANDIDATE VRd q21d starting cycle 6 (Induction/Consolidation)     07/31/2023 - 07/31/2023 Chemotherapy   Patient is on Treatment Plan : MYELOMA Daratumumab  IV q28d     08/05/2023 -  Chemotherapy   Patient is on Treatment Plan : MYELOMA NEWLY DIAGNOSED TRANSPLANT CANDIDATE Daratumumab  SQ + Lenalidomide  q28d (Maintenance)       CHIEF COMPLIANT: Follow-up of multiple myeloma, daratumumab  and Revlimid   History of Present Illness  Kara Medina is a 49 year old female with multiple myeloma post autologous stem cell transplant who presents for routine follow-up and ongoing treatment management.  She has been receiving maintenance therapy with daratumumab  and Revlimid  since after her autologous stem cell transplant in September 2024. Her most recent M protein was 0.3 g/dL and her kappa/lambda ratio has been under 2. She denies pain or other symptoms.  She continues to receive Zometa  infusions for bone health, with her last dose in December 2025. She also received Zemaira in December  2025. Her most recent calcium level is 8.9 mg/dL. She has not experienced symptoms of hypocalcemia.  She has undergone laboratory assessment for menopausal status, with estradiol  measured at 12.5. She does not report menopausal symptoms. She reports that monthly pregnancy testing has been consistent, with the next test expected at the end of January 2026.   ALLERGIES:  is allergic to chlorhexidine, tape, amoxicillin, and morphine sulfate.  MEDICATIONS:  Current Outpatient Medications  Medication Sig Dispense Refill   acyclovir  (ZOVIRAX ) 400 MG tablet Take 2 tablets (800 mg total) by mouth 2 (two) times daily. 60 tablet 5   amLODipine  (NORVASC ) 5 MG tablet Take 1 tablet (5 mg total) by mouth daily. 90 tablet 3   aspirin EC 81 MG tablet Take 81 mg by mouth daily. Swallow whole.     Blood Pressure Monitoring (BLOOD PRESSURE CUFF) MISC 1 Device by Does not apply route daily. 1 each 0   cefadroxil  (DURICEF) 500 MG capsule Take 1 capsule (500 mg total) by mouth 2 (two) times daily. 10 capsule 0   cyclobenzaprine  (FLEXERIL ) 5 MG tablet Take 1 tablet (5 mg total) by mouth 3 (three) times daily as needed for muscle spasms. 30 tablet 0   dexamethasone  (DECADRON ) 4 MG tablet Take by mouth.     ergocalciferol  (VITAMIN D2) 1.25 MG (50000 UT) capsule Take 50,000 Units by mouth once a week.     folic acid -vitamin b complex-vitamin c-selenium-zinc (DIALYVITE) 3 MG TABS tablet Take 1 tablet by mouth daily.     ibuprofen  (ADVIL ) 200 MG tablet Take 400 mg by mouth every 6 (six) hours as needed for headache or moderate pain.  labetalol  (NORMODYNE ) 200 MG tablet Take 1 tablet (200 mg total) by mouth 2 (two) times daily. 180 tablet 3   lenalidomide  (REVLIMID ) 10 MG capsule TAKE 1 CAPSULE BY MOUTH 1 TIME A DAY 21 capsule 0   lidocaine -prilocaine  (EMLA ) cream Apply topically once.     lidocaine -prilocaine  (EMLA ) cream Apply to affected area once 30 g 3   meclizine  (ANTIVERT ) 25 MG tablet Take 1 tablet (25 mg  total) by mouth 3 (three) times daily as needed for dizziness. 30 tablet 0   Multiple Vitamins-Minerals (CENTRUM ADULTS) TABS Take 1 tablet by mouth daily.     ondansetron  (ZOFRAN -ODT) 4 MG disintegrating tablet Take 1 tablet (4 mg total) by mouth every 8 (eight) hours as needed for nausea or vomiting. 20 tablet 0   prochlorperazine  (COMPAZINE ) 10 MG tablet Take 1 tablet (10 mg total) by mouth every 6 (six) hours as needed for nausea or vomiting. 30 tablet 1   traMADol  (ULTRAM ) 50 MG tablet Take 1 tablet (50 mg total) by mouth every 12 (twelve) hours as needed. 30 tablet 0   Vitamin D , Ergocalciferol , (DRISDOL ) 1.25 MG (50000 UNIT) CAPS capsule Take 1 capsule (50,000 Units total) by mouth every 7 (seven) days. 12 capsule 0   WEGOVY 0.25 MG/0.5ML SOAJ SQ injection Inject 0.25 mg into the skin once a week.     No current facility-administered medications for this visit.    PHYSICAL EXAMINATION: ECOG PERFORMANCE STATUS: 1 - Symptomatic but completely ambulatory  Vitals:   08/03/24 1255  BP: 134/81  Pulse: 86  Resp: 17  Temp: 98.2 F (36.8 C)  SpO2: 100%    Filed Weights   08/03/24 1255  Weight: 225 lb 12.8 oz (102.4 kg)   Physical Exam GENERAL: Alert, cooperative, well developed, no acute distress HEENT: Normocephalic, normal oropharynx, moist mucous membranes CHEST: Clear to auscultation bilaterally, no wheezes, rhonchi, or crackles CARDIOVASCULAR: Normal heart rate and rhythm, S1 and S2 normal without murmurs ABDOMEN: Soft, non-tender, non-distended, without organomegaly, normal bowel sounds EXTREMITIES: No cyanosis or edema NEUROLOGICAL: Cranial nerves grossly intact, moves all extremities without gross motor or sensory deficit   LABORATORY DATA:  I have reviewed the data as listed    Latest Ref Rng & Units 08/03/2024   12:29 PM 07/06/2024    8:12 AM 06/08/2024    7:53 AM  CMP  Glucose 70 - 99 mg/dL 95  96  885   BUN 6 - 20 mg/dL 18  14  15    Creatinine 0.44 - 1.00  mg/dL 9.16  9.21  9.24   Sodium 135 - 145 mmol/L 141  140  140   Potassium 3.5 - 5.1 mmol/L 3.7  3.8  3.7   Chloride 98 - 111 mmol/L 105  106  106   CO2 22 - 32 mmol/L 26  26  26    Calcium 8.9 - 10.3 mg/dL 8.9  8.7  9.1   Total Protein 6.5 - 8.1 g/dL 6.5  6.4  6.7   Total Bilirubin 0.0 - 1.2 mg/dL 0.3  0.4  0.6   Alkaline Phos 38 - 126 U/L 61  59  56   AST 15 - 41 U/L 20  21  23    ALT 0 - 44 U/L 17  19  17      Lab Results  Component Value Date   WBC 3.6 (L) 08/03/2024   HGB 10.4 (L) 08/03/2024   HCT 31.8 (L) 08/03/2024   MCV 91.4 08/03/2024   PLT 204 08/03/2024  NEUTROABS 1.2 (L) 08/03/2024    ASSESSMENT & PLAN:  Assessment & Plan  Multiple myeloma not having achieved remission Persistent multiple myeloma with stable disease parameters post-autologous stem cell transplant. M protein at 0.3 g/dL, stable kappa/lambda ratio under 2. Continued daratumumab  and lenalidomide  maintenance therapy.  Scheduled next daratumumab  infusion for February. Reinstated Zometa  for bisphosphonate therapy, due for Zometa  in March.  Schedule pregnancy test for end of month as required for lenalidomide .  Hypocalcemia secondary to bisphosphonate therapy Mild, borderline hypocalcemia (calcium 8.9 mg/dL) likely due to Zometa . Asymptomatic and not clinically concerning. - Recommended calcium supplementation with vitamin D /calcium preparations or Tums twice weekly. - No further intervention necessary.  The patient has a good understanding of the overall plan. she agrees with it. she will call with any problems that may develop before the next visit here.   Amber Stalls, MD 08/03/24    "

## 2024-08-04 ENCOUNTER — Encounter: Payer: Self-pay | Admitting: Hematology and Oncology

## 2024-08-04 ENCOUNTER — Other Ambulatory Visit: Payer: Self-pay

## 2024-08-04 DIAGNOSIS — C9000 Multiple myeloma not having achieved remission: Secondary | ICD-10-CM

## 2024-08-04 MED ORDER — LIDOCAINE-PRILOCAINE 2.5-2.5 % EX CREA: TOPICAL_CREAM | Freq: Once | CUTANEOUS | 1 refills | Status: AC

## 2024-08-05 ENCOUNTER — Telehealth: Payer: Self-pay

## 2024-08-05 ENCOUNTER — Other Ambulatory Visit: Payer: Self-pay

## 2024-08-05 DIAGNOSIS — D472 Monoclonal gammopathy: Secondary | ICD-10-CM

## 2024-08-05 LAB — PROTEIN ELECTROPHORESIS, SERUM
A/G Ratio: 1.5 (ref 0.7–1.7)
Albumin ELP: 3.5 g/dL (ref 2.9–4.4)
Alpha-1-Globulin: 0.2 g/dL (ref 0.0–0.4)
Alpha-2-Globulin: 0.7 g/dL (ref 0.4–1.0)
Beta Globulin: 1 g/dL (ref 0.7–1.3)
Gamma Globulin: 0.6 g/dL (ref 0.4–1.8)
Globulin, Total: 2.4 g/dL (ref 2.2–3.9)
M-Spike, %: 0.2 g/dL — ABNORMAL HIGH
Total Protein ELP: 5.9 g/dL — ABNORMAL LOW (ref 6.0–8.5)

## 2024-08-05 NOTE — Telephone Encounter (Signed)
 Patient is scheduled for urine pregnancy test 08/13/24 as required by [facility] for Revlimid . Once back we can refill.

## 2024-08-10 LAB — IFE, DARA-SPECIFIC, SERUM
IgA: 190 mg/dL (ref 87–352)
IgG (Immunoglobin G), Serum: 750 mg/dL (ref 586–1602)
IgM (Immunoglobulin M), Srm: 31 mg/dL (ref 26–217)

## 2024-08-11 ENCOUNTER — Encounter: Payer: Self-pay | Admitting: Family Medicine

## 2024-08-11 ENCOUNTER — Ambulatory Visit: Admitting: Family Medicine

## 2024-08-11 ENCOUNTER — Other Ambulatory Visit: Payer: Self-pay | Admitting: Hematology and Oncology

## 2024-08-11 VITALS — BP 116/80 | HR 103 | Temp 98.6°F | Ht 66.0 in | Wt 223.2 lb

## 2024-08-11 DIAGNOSIS — J3489 Other specified disorders of nose and nasal sinuses: Secondary | ICD-10-CM

## 2024-08-11 DIAGNOSIS — C9000 Multiple myeloma not having achieved remission: Secondary | ICD-10-CM | POA: Diagnosis not present

## 2024-08-11 DIAGNOSIS — D472 Monoclonal gammopathy: Secondary | ICD-10-CM

## 2024-08-11 DIAGNOSIS — J069 Acute upper respiratory infection, unspecified: Secondary | ICD-10-CM

## 2024-08-11 DIAGNOSIS — R0981 Nasal congestion: Secondary | ICD-10-CM

## 2024-08-11 LAB — POCT INFLUENZA A/B
Influenza A, POC: NEGATIVE
Influenza B, POC: NEGATIVE

## 2024-08-11 LAB — POC COVID19 BINAXNOW: SARS Coronavirus 2 Ag: NEGATIVE

## 2024-08-11 MED ORDER — BENZONATATE 100 MG PO CAPS: 100.0000 mg | ORAL_CAPSULE | Freq: Two times a day (BID) | ORAL | 0 refills | Status: AC | PRN

## 2024-08-11 NOTE — Progress Notes (Signed)
 "  Established Patient Office Visit   Subjective  Patient ID: Kara Medina, female    DOB: 1975-11-07  Age: 49 y.o. MRN: [id number]  Chief Complaint  Patient presents with   Acute Visit    Patient came in today for a cough, congestion, runny nose, yellow mucus , started 2 days ago     Patient is a 49 year old female with pmh sig for Multiple myeloma, OSA, HTN, LVH, Iron def anemia seen for acute illness.  Pt developed cough and chest congestion 3 days ago.  She then developed rhinorrhea, nasal congestion yesterday.  Pt denies fever, chills, headache, aches, pains, nausea, vomiting, diarrhea, sore throat.  Tried over-the-counter cough medication.  States was unsure if she needed an antibiotic.    Patient Active Problem List   Diagnosis Date Noted   Family history of mixed hyperlipidemia 01/30/2023   Obstructive sleep apnea 01/30/2023   Port-A-Cath in place 01/06/2023   HTN (hypertension) 11/14/2022   Multiple myeloma (HCC) 09/12/2022   MGUS (monoclonal gammopathy of unknown significance) 09/10/2022   Hypertensive urgency 08/29/2022   Iron deficiency anemia 08/29/2022   LVH (left ventricular hypertrophy) 08/29/2022   Normocytic anemia 08/29/2022   Past Medical History:  Diagnosis Date   Anemia 08/23/2022   Gestational diabetes    diet controlled   Multiple myeloma (HCC)    Postpartum care following vaginal delivery (7/27) 02/09/2016   Pregnancy induced hypertension    Trigeminal neuralgia    2012 corrected with surgery   Past Surgical History:  Procedure Laterality Date   APPENDECTOMY  2008   BRAIN SURGERY  2012   Trigeminal Neuralgia surgery   IR IMAGING GUIDED PORT INSERTION  12/03/2022   TRIGEMINAL NERVE DECOMPRESSION  2012   Social History[1] Family History  Problem Relation Age of Onset   Cancer Other    Hypertension Other    Cancer Father    Cancer Paternal Grandfather        stomach, lung   Hypertension Mother    Breast cancer Mother 71   Cancer Mother     Hypertension Maternal Grandmother    Arthritis Maternal Grandmother    Stroke Maternal Grandmother    Hypertension Maternal Grandfather    Other Brother        ?prediabetic   Diabetes Paternal Grandmother    Hypertension Paternal Grandmother    Autism Son    Allergies[2]  ROS Negative unless stated above    Objective:     BP 116/80 (BP Location: Left Arm, Patient Position: Sitting, Cuff Size: Large)   Pulse (!) 103   Temp 98.6 F (37 C) (Oral)   Ht 5' 6 (1.676 m)   Wt 223 lb 3.2 oz (101.2 kg)   LMP  (LMP Unknown)   SpO2 99%   BMI 36.03 kg/m  BP Readings from Last 3 Encounters:  08/11/24 116/80  08/03/24 134/81  07/06/24 130/78   Wt Readings from Last 3 Encounters:  08/11/24 223 lb 3.2 oz (101.2 kg)  08/03/24 225 lb 12.8 oz (102.4 kg)  07/06/24 226 lb 11.2 oz (102.8 kg)      Physical Exam Constitutional:      General: She is not in acute distress.    Appearance: Normal appearance.  HENT:     Head: Normocephalic and atraumatic.     Right Ear: Tympanic membrane normal.     Left Ear: Tympanic membrane normal.     Nose: Nose normal.     Mouth/Throat:     Mouth:  Mucous membranes are moist.  Cardiovascular:     Rate and Rhythm: Normal rate and regular rhythm.     Heart sounds: Normal heart sounds. No murmur heard.    No gallop.  Pulmonary:     Effort: Pulmonary effort is normal. No respiratory distress.     Breath sounds: Normal breath sounds. No wheezing, rhonchi or rales.  Lymphadenopathy:     Cervical: Cervical adenopathy present.     Left cervical: Superficial cervical adenopathy present.  Skin:    General: Skin is warm and dry.  Neurological:     Mental Status: She is alert and oriented to person, place, and time.        08/11/2024   11:50 AM 08/03/2024   12:56 PM 07/06/2024    9:21 AM  Depression screen PHQ 2/9  Decreased Interest 0 0 0  Down, Depressed, Hopeless 0 0 0  PHQ - 2 Score 0 0 0  Altered sleeping 0    Tired, decreased energy 1     Change in appetite 0    Feeling bad or failure about yourself  0    Trouble concentrating 0    Moving slowly or fidgety/restless 0    Suicidal thoughts 0    PHQ-9 Score 1        08/11/2024   11:50 AM 05/27/2024    4:25 PM  GAD 7 : Generalized Anxiety Score  Nervous, Anxious, on Edge 0 0   Control/stop worrying 0 0   Worry too much - different things 0 0   Trouble relaxing 0 0   Restless 0 0   Easily annoyed or irritable 0 0   Afraid - awful might happen 0 0   Total GAD 7 Score 0 0     Data saved with a previous flowsheet row definition     Results for orders placed or performed in visit on 08/11/24  POC COVID-19 BinaxNow  Result Value Ref Range   SARS Coronavirus 2 Ag Negative Negative  POC Influenza A/B  Result Value Ref Range   Influenza A, POC Negative Negative   Influenza B, POC Negative Negative      Assessment & Plan:   Viral URI with cough -     Benzonatate ; Take 1 capsule (100 mg total) by mouth 2 (two) times daily as needed for cough.  Dispense: 20 capsule; Refill: 0  Nasal congestion -     POC COVID-19 BinaxNow -     POCT Influenza A/B  Rhinorrhea  Multiple myeloma not having achieved remission (HCC)   Acute URI symptoms likely viral in etiology.  POC COVID and flu testing negative.  Discussed supportive care.  Despite history of multiple myeloma no ABX advised at this time.  Tessalon  sent to pharmacy.  Continue supportive care with OTC cough/cold medications.  Patient advised to read labels to avoid taking too much Tylenol  or NSAIDs as many OTC cough/cold meds may contain them.  Saline nasal rinse or flonase ok.  Notify clinic for continued or worsened symptoms.  Return if symptoms worsen or fail to improve.   Clotilda JONELLE Single, MD     [1]  Social History Tobacco Use   Smoking status: Never   Smokeless tobacco: Never  Vaping Use   Vaping status: Never Used  Substance Use Topics   Alcohol use: No   Drug use: No  [2]  Allergies Allergen  Reactions   Chlorhexidine Other (See Comments)    Cause skin irritation .Please use ALCOHOL  swab sticks.   Tape Rash    Transparent dressing causes severe itching and a rash - please use Mepilex dressing   Amoxicillin Hives    Has patient had a PCN reaction causing immediate rash, facial/tongue/throat swelling, SOB or lightheadedness with hypotension: Yes Has patient had a PCN reaction causing severe rash involving mucus membranes or skin necrosis: No Has patient had a PCN reaction that required hospitalization No Has patient had a PCN reaction occurring within the last 10 years: No If all of the above answers are NO, then may proceed with Cephalosporin use.    Morphine Sulfate Other (See Comments) and Nausea And Vomiting   "

## 2024-08-11 NOTE — Patient Instructions (Addendum)
 Your your COVID and flu test were negative.  Currently it looks like your symptoms are caused by a virus.  Antibiotics do not work on viruses.  Continue monitoring your symptoms.  If they become worse notify clinic.  A prescription for Tessalon , a medication for cough was sent to your pharmacy.

## 2024-08-13 ENCOUNTER — Other Ambulatory Visit: Payer: Self-pay

## 2024-08-13 ENCOUNTER — Inpatient Hospital Stay

## 2024-08-13 ENCOUNTER — Encounter: Payer: Self-pay | Admitting: Hematology and Oncology

## 2024-08-13 DIAGNOSIS — C9000 Multiple myeloma not having achieved remission: Secondary | ICD-10-CM

## 2024-08-13 DIAGNOSIS — Z5112 Encounter for antineoplastic immunotherapy: Secondary | ICD-10-CM | POA: Diagnosis not present

## 2024-08-13 DIAGNOSIS — D472 Monoclonal gammopathy: Secondary | ICD-10-CM

## 2024-08-13 LAB — PREGNANCY, URINE: Preg Test, Ur: NEGATIVE

## 2024-08-13 MED ORDER — LENALIDOMIDE 10 MG PO CAPS: ORAL_CAPSULE | ORAL | 0 refills | Status: AC

## 2024-08-13 NOTE — Telephone Encounter (Signed)
 Done

## 2024-08-13 NOTE — Telephone Encounter (Signed)
 Refill submitted for Revlimid  per MD as pt's UPT was negative. Prescriber survey completed and auth obtained. Pt is aware and has no other concerns at this time.

## 2024-08-31 ENCOUNTER — Inpatient Hospital Stay: Attending: Hematology and Oncology

## 2024-08-31 ENCOUNTER — Inpatient Hospital Stay

## 2024-08-31 ENCOUNTER — Inpatient Hospital Stay: Admitting: Hematology and Oncology

## 2024-09-10 ENCOUNTER — Inpatient Hospital Stay

## 2024-11-24 ENCOUNTER — Ambulatory Visit: Admitting: Family Medicine
